# Patient Record
Sex: Female | Born: 1990 | Race: Black or African American | Hispanic: No | Marital: Single | Smoking: Former smoker
Health system: Southern US, Community
[De-identification: ages and names within clinical notes are randomized; demographics above are authoritative.]

## PROBLEM LIST (undated history)

## (undated) ENCOUNTER — Inpatient Hospital Stay (HOSPITAL_COMMUNITY): Payer: Self-pay

## (undated) DIAGNOSIS — J45909 Unspecified asthma, uncomplicated: Secondary | ICD-10-CM

## (undated) DIAGNOSIS — L309 Dermatitis, unspecified: Secondary | ICD-10-CM

## (undated) HISTORY — DX: Dermatitis, unspecified: L30.9

## (undated) HISTORY — PX: NASAL SINUS SURGERY: SHX719

---

## 2010-03-21 ENCOUNTER — Emergency Department (HOSPITAL_BASED_OUTPATIENT_CLINIC_OR_DEPARTMENT_OTHER): Admission: EM | Admit: 2010-03-21 | Discharge: 2010-03-21 | Payer: Self-pay | Admitting: Emergency Medicine

## 2010-07-27 ENCOUNTER — Emergency Department (HOSPITAL_BASED_OUTPATIENT_CLINIC_OR_DEPARTMENT_OTHER)
Admission: EM | Admit: 2010-07-27 | Discharge: 2010-07-27 | Payer: Self-pay | Source: Home / Self Care | Admitting: Emergency Medicine

## 2010-08-02 LAB — GC/CHLAMYDIA PROBE AMP, GENITAL
Chlamydia, DNA Probe: NEGATIVE
GC Probe Amp, Genital: NEGATIVE

## 2010-08-02 LAB — DIFFERENTIAL
Basophils Absolute: 0 10*3/uL (ref 0.0–0.1)
Basophils Relative: 0 % (ref 0–1)
Eosinophils Absolute: 0.9 10*3/uL — ABNORMAL HIGH (ref 0.0–0.7)
Eosinophils Relative: 9 % — ABNORMAL HIGH (ref 0–5)
Lymphocytes Relative: 21 % (ref 12–46)
Lymphs Abs: 2.1 10*3/uL (ref 0.7–4.0)
Monocytes Absolute: 0.8 10*3/uL (ref 0.1–1.0)
Monocytes Relative: 8 % (ref 3–12)
Neutro Abs: 6.2 10*3/uL (ref 1.7–7.7)
Neutrophils Relative %: 62 % (ref 43–77)

## 2010-08-02 LAB — COMPREHENSIVE METABOLIC PANEL
ALT: 22 U/L (ref 0–35)
AST: 46 U/L — ABNORMAL HIGH (ref 0–37)
Albumin: 3.8 g/dL (ref 3.5–5.2)
Alkaline Phosphatase: 41 U/L (ref 39–117)
BUN: 11 mg/dL (ref 6–23)
CO2: 21 mEq/L (ref 19–32)
Calcium: 9.2 mg/dL (ref 8.4–10.5)
Chloride: 107 mEq/L (ref 96–112)
Creatinine, Ser: 0.5 mg/dL (ref 0.4–1.2)
GFR calc Af Amer: >60 mL/min (ref >60)
GFR calc non Af Amer: >60 mL/min (ref >60)
Glucose, Bld: 78 mg/dL (ref 70–99)
Potassium: 4.8 mEq/L (ref 3.5–5.1)
Sodium: 138 mEq/L (ref 135–145)
Total Bilirubin: 0.6 mg/dL (ref 0.3–1.2)
Total Protein: 7.1 g/dL (ref 6.0–8.3)

## 2010-08-02 LAB — CBC
HCT: 30.7 % — ABNORMAL LOW (ref 36.0–46.0)
Hemoglobin: 10.3 g/dL — ABNORMAL LOW (ref 12.0–15.0)
MCH: 23.6 pg — ABNORMAL LOW (ref 26.0–34.0)
MCHC: 33.6 g/dL (ref 30.0–36.0)
MCV: 70.4 fL — ABNORMAL LOW (ref 78.0–100.0)
Platelets: 287 10*3/uL (ref 150–400)
RBC: 4.36 MIL/uL (ref 3.87–5.11)
RDW: 12.8 % (ref 11.5–15.5)
WBC: 10 10*3/uL (ref 4.0–10.5)

## 2010-08-02 LAB — URINALYSIS, ROUTINE W REFLEX MICROSCOPIC
Bilirubin Urine: NEGATIVE
Hgb urine dipstick: NEGATIVE
Ketones, ur: NEGATIVE mg/dL
Nitrite: NEGATIVE
Protein, ur: NEGATIVE mg/dL
Specific Gravity, Urine: 1.034 — ABNORMAL HIGH (ref 1.005–1.030)
Urine Glucose, Fasting: NEGATIVE mg/dL
Urobilinogen, UA: 1 mg/dL (ref 0.0–1.0)
pH: 6.5 (ref 5.0–8.0)

## 2010-08-02 LAB — WET PREP, GENITAL
Trich, Wet Prep: NONE SEEN
Yeast Wet Prep HPF POC: NONE SEEN

## 2010-08-02 LAB — PREGNANCY, URINE: Preg Test, Ur: POSITIVE

## 2010-10-26 ENCOUNTER — Emergency Department (HOSPITAL_BASED_OUTPATIENT_CLINIC_OR_DEPARTMENT_OTHER)
Admission: EM | Admit: 2010-10-26 | Discharge: 2010-10-26 | Disposition: A | Discharge status: Home / Self Care | Payer: 59 | Attending: Emergency Medicine | Admitting: Emergency Medicine

## 2010-10-26 DIAGNOSIS — J309 Allergic rhinitis, unspecified: Secondary | ICD-10-CM | POA: Insufficient documentation

## 2010-11-04 ENCOUNTER — Inpatient Hospital Stay (HOSPITAL_COMMUNITY)
Admission: AD | Admit: 2010-11-04 | Discharge: 2010-11-04 | Disposition: A | Discharge status: Home / Self Care | Payer: 59 | Source: Ambulatory Visit | Attending: Obstetrics & Gynecology | Admitting: Obstetrics & Gynecology

## 2010-11-04 DIAGNOSIS — O99891 Other specified diseases and conditions complicating pregnancy: Secondary | ICD-10-CM | POA: Insufficient documentation

## 2010-11-04 DIAGNOSIS — O9989 Other specified diseases and conditions complicating pregnancy, childbirth and the puerperium: Secondary | ICD-10-CM

## 2011-03-18 ENCOUNTER — Emergency Department (HOSPITAL_BASED_OUTPATIENT_CLINIC_OR_DEPARTMENT_OTHER)
Admission: EM | Admit: 2011-03-18 | Discharge: 2011-03-18 | Disposition: A | Discharge status: Home / Self Care | Payer: Self-pay | Attending: Emergency Medicine | Admitting: Emergency Medicine

## 2011-03-18 DIAGNOSIS — R51 Headache: Secondary | ICD-10-CM | POA: Insufficient documentation

## 2011-03-18 DIAGNOSIS — F17200 Nicotine dependence, unspecified, uncomplicated: Secondary | ICD-10-CM | POA: Insufficient documentation

## 2011-03-18 MED ORDER — SUMATRIPTAN SUCCINATE 100 MG PO TABS: 100.0000 mg | ORAL_TABLET | ORAL | Status: DC | PRN

## 2011-03-18 NOTE — ED Provider Notes (Signed)
History     CSN: [id number] Arrival date & time: 03/18/2011  4:36 PM  Chief Complaint  Patient presents with  . Headache   Patient is a 20 y.o. female presenting with headaches. The history is provided by the patient.  Headache  This is a new problem. The current episode started 6 to 12 hours ago. The problem has been resolved. The headache is associated with nothing. The pain is located in the occipital and temporal region. The quality of the pain is described as sharp. The pain is at a severity of 7/10. The pain is moderate. The pain does not radiate. She has tried nothing for the symptoms. The treatment provided no relief.    History reviewed. No pertinent past medical history.  History reviewed. No pertinent past surgical history.  No family history on file.  History  Substance Use Topics  . Smoking status: Current Some Day Smoker  . Smokeless tobacco: Not on file  . Alcohol Use: Yes     occasional    OB History    Grav Para Term Preterm Abortions TAB SAB Ect Mult Living                  Review of Systems  Neurological: Positive for headaches.  All other systems reviewed and are negative.    Physical Exam  BP 141/77  Pulse 101  Temp(Src) 98.3 F (36.8 C) (Oral)  Resp 16  Ht 5\' 7"  (1.702 m)  Wt 200 lb (90.719 kg)  BMI 31.32 kg/m2  SpO2 100%  LMP 02/15/2011  Physical Exam  Constitutional: She is oriented to person, place, and time. She appears well-developed and well-nourished.  HENT:  Head: Normocephalic and atraumatic.  Eyes: Pupils are equal, round, and reactive to light.  Neck: Normal range of motion. Neck supple.  Cardiovascular: Normal rate.   Pulmonary/Chest: Effort normal.  Abdominal: Soft. Bowel sounds are normal.  Musculoskeletal: Normal range of motion.  Neurological: She is alert and oriented to person, place, and time. She has normal reflexes. No cranial nerve deficit. Coordination normal.  Skin: Skin is warm and dry.  Psychiatric: She has  a normal mood and affect.    ED Course  Procedures  MDM  Pt counseled on headaches and followup.        Langston Masker, [place] 03/18/11 1724

## 2011-03-18 NOTE — ED Notes (Signed)
Headache x 1 week unrelieved after taking Ibuprofen and Advil.

## 2011-03-19 NOTE — ED Provider Notes (Signed)
Medical screening examination/treatment/procedure(s) were performed by non-physician practitioner and as supervising physician I was immediately available for consultation/collaboration.    Forbes Cellar, MD 03/19/11 0111

## 2011-06-24 ENCOUNTER — Other Ambulatory Visit (HOSPITAL_BASED_OUTPATIENT_CLINIC_OR_DEPARTMENT_OTHER): Payer: Self-pay | Admitting: *Deleted

## 2011-06-24 ENCOUNTER — Ambulatory Visit (HOSPITAL_BASED_OUTPATIENT_CLINIC_OR_DEPARTMENT_OTHER)
Admission: RE | Admit: 2011-06-24 | Discharge: 2011-06-24 | Disposition: A | Discharge status: Home / Self Care | Payer: Medicaid Other | Source: Ambulatory Visit | Attending: Family Medicine | Admitting: Family Medicine

## 2011-06-24 DIAGNOSIS — H059 Unspecified disorder of orbit: Secondary | ICD-10-CM

## 2011-06-24 DIAGNOSIS — H0589 Other disorders of orbit: Secondary | ICD-10-CM

## 2011-06-24 DIAGNOSIS — J3489 Other specified disorders of nose and nasal sinuses: Secondary | ICD-10-CM | POA: Insufficient documentation

## 2011-06-24 DIAGNOSIS — H5789 Other specified disorders of eye and adnexa: Secondary | ICD-10-CM | POA: Insufficient documentation

## 2011-06-24 MED ORDER — IOHEXOL 350 MG/ML SOLN
100.0000 mL | Freq: Once | INTRAVENOUS | Status: AC | PRN
  Administered 2011-06-24: 100 mL via INTRAVENOUS

## 2011-07-05 ENCOUNTER — Other Ambulatory Visit: Payer: Self-pay | Admitting: Obstetrics and Gynecology

## 2012-03-11 ENCOUNTER — Emergency Department (HOSPITAL_BASED_OUTPATIENT_CLINIC_OR_DEPARTMENT_OTHER)
Admission: EM | Admit: 2012-03-11 | Discharge: 2012-03-12 | Disposition: A | Discharge status: Home / Self Care | Payer: Medicaid Other | Attending: Emergency Medicine | Admitting: Emergency Medicine

## 2012-03-11 ENCOUNTER — Encounter (HOSPITAL_BASED_OUTPATIENT_CLINIC_OR_DEPARTMENT_OTHER): Payer: Self-pay | Admitting: *Deleted

## 2012-03-11 DIAGNOSIS — R55 Syncope and collapse: Secondary | ICD-10-CM | POA: Insufficient documentation

## 2012-03-11 DIAGNOSIS — B9689 Other specified bacterial agents as the cause of diseases classified elsewhere: Secondary | ICD-10-CM | POA: Insufficient documentation

## 2012-03-11 DIAGNOSIS — N760 Acute vaginitis: Secondary | ICD-10-CM | POA: Insufficient documentation

## 2012-03-11 DIAGNOSIS — A499 Bacterial infection, unspecified: Secondary | ICD-10-CM | POA: Insufficient documentation

## 2012-03-11 DIAGNOSIS — O2390 Unspecified genitourinary tract infection in pregnancy, unspecified trimester: Secondary | ICD-10-CM | POA: Insufficient documentation

## 2012-03-11 LAB — URINALYSIS, ROUTINE W REFLEX MICROSCOPIC
Bilirubin Urine: NEGATIVE
Glucose, UA: NEGATIVE mg/dL
Hgb urine dipstick: NEGATIVE
Ketones, ur: NEGATIVE mg/dL
Nitrite: NEGATIVE
Protein, ur: NEGATIVE mg/dL
Specific Gravity, Urine: 1.03 (ref 1.005–1.030)
Urobilinogen, UA: 1 mg/dL (ref 0.0–1.0)
pH: 6.5 (ref 5.0–8.0)

## 2012-03-11 LAB — URINE MICROSCOPIC-ADD ON

## 2012-03-11 NOTE — ED Notes (Signed)
Patient reports that while working today became weak and had syncopal event. Reports that she fell onto floor and denies any injury or discomfort. LMP reported as 10/12/11. No prenatal care up to this point, appointment scheduled in September. Third pregnancy and 2 living children, reports positive fetal movement. Denies discharge, denies bleeding

## 2012-03-11 NOTE — ED Provider Notes (Addendum)
History   This chart was scribed for Judaea Burgoon Smitty Cords, MD by Toya Smothers. The patient was seen in room MHT14/MHT14. Patient's care was started at 2213.   CSN: [id number]  Arrival date & time 03/11/12  2213   First MD Initiated Contact with Patient 03/11/12 2337      Chief Complaint  Patient presents with  . Loss of Consciousness   HPI Comments: Positive pregnant test. Scheduled to speak with OBGYN in Sept.  Patient is a 21 y.o. female presenting with syncope and fall. The history is provided by the patient. No language interpreter was used.  Loss of Consciousness This is a new problem. The current episode started 3 to 5 hours ago (After standing for an hour). The problem has been resolved. Pertinent negatives include no abdominal pain. Nothing aggravates the symptoms. The symptoms are relieved by relaxation. She has tried nothing for the symptoms. The treatment provided significant relief.  Fall The accident occurred 3 to 5 hours ago. The fall occurred while standing. She fell from a height of 1 to 2 ft. She landed on a hard floor. There was no blood loss. Point of impact: abdomen. The patient is experiencing no pain. Pertinent negatives include no abdominal pain and no hematuria. Exacerbated by: nothing. She has tried nothing for the symptoms. The treatment provided significant relief.  No CP, no SOB, no n/v/d.  No swelling in the legs no long car trips.  No vaginal bleeding or abdominal pain, No prenatal care.  LMP 3/27/ 13  History reviewed. No pertinent past medical history.  History reviewed. No pertinent past surgical history.  History reviewed. No pertinent family history.  History  Substance Use Topics  . Smoking status: Former Games developer  . Smokeless tobacco: Not on file  . Alcohol Use: No     occasional    OB History    Grav Para Term Preterm Abortions TAB SAB Ect Mult Living   3 2              Review of Systems  Respiratory: Negative for chest tightness.     Cardiovascular: Positive for syncope. Negative for palpitations and leg swelling.  Gastrointestinal: Negative for abdominal pain.  Genitourinary: Positive for frequency. Negative for dysuria, hematuria, decreased urine volume, vaginal bleeding, vaginal discharge, difficulty urinating, vaginal pain and menstrual problem.  Neurological: Positive for syncope.  All other systems reviewed and are negative.    Allergies  Review of patient's allergies indicates no known allergies.  Home Medications   Current Outpatient Rx  Name Route Sig Dispense Refill  . PRENATAL 27-0.8 MG PO TABS Oral Take 1 tablet by mouth daily.    . TRIAMCINOLONE ACETONIDE 0.1 % EX CREA Topical Apply 1 application topically 2 (two) times daily as needed. For eczema      BP 113/59  Pulse 78  Temp 98.1 F (36.7 C) (Oral)  Resp 20  Ht 5\' 7"  (1.702 m)  Wt 230 lb (104.327 kg)  BMI 36.02 kg/m2  SpO2 100%  LMP 10/12/2011  Physical Exam  Constitutional: She is oriented to person, place, and time. She appears well-developed and well-nourished. No distress.  HENT:  Head: Normocephalic and atraumatic.  Mouth/Throat: Oropharynx is clear and moist. No oropharyngeal exudate.  Eyes: EOM are normal. Pupils are equal, round, and reactive to light.  Neck: Normal range of motion. Neck supple. No tracheal deviation present.  Cardiovascular: Normal rate, regular rhythm, normal heart sounds and intact distal pulses.   Pulmonary/Chest: Effort normal and  breath sounds normal. No respiratory distress. She has no wheezes.  Abdominal: Bowel sounds are normal. There is no tenderness. There is no rebound and no guarding.       Gravid. Non-tender.  Genitourinary: Uterus normal. Uterus is not deviated. Vaginal discharge found.       Chaperone present no bleeding os is closed  Musculoskeletal: Normal range of motion. She exhibits no edema and no tenderness.  Neurological: She is alert and oriented to person, place, and time. She has  normal reflexes.  Skin: Skin is warm and dry.  Psychiatric: She has a normal mood and affect. Her behavior is normal.    ED Course  Procedures (including critical care time) DIAGNOSTIC STUDIES: Oxygen Saturation is 100% on room air, normal by my interpretation.    COORDINATION OF CARE: 2304- Ordered CBC with Differential STAT. 2304- Ordered Basic metabolic panel STAT. 2304- Ordered Urinalysis, Routine w reflex microscopic STAT. 2305- Ordered Urine culture Once. 2316- Ordered Urine microscopic-add on Once. 2324- Ordered hCG, quantitative, pregnancy STAT 2347- Evaluated Pt. Pt is awake, alert, and oriented. 0045- Ordered cefTRIAXone (ROCEPHIN) 1 g in dextrose 5 % 50 mL IVPB Once. 38- Chaperone present. Completed vaginal exam.   Labs Reviewed  CBC WITH DIFFERENTIAL - Abnormal; Notable for the following:    Hemoglobin 9.9 (*)     HCT 30.0 (*)     MCV 71.3 (*)     MCH 23.5 (*)     Eosinophils Relative 10 (*)     Eosinophils Absolute 0.9 (*)     All other components within normal limits  URINALYSIS, ROUTINE W REFLEX MICROSCOPIC - Abnormal; Notable for the following:    APPearance CLOUDY (*)     Leukocytes, UA MODERATE (*)     All other components within normal limits  HCG, QUANTITATIVE, PREGNANCY - Abnormal; Notable for the following:    hCG, Beta Chain, Quant, S 2691 (*)     All other components within normal limits  URINE MICROSCOPIC-ADD ON - Abnormal; Notable for the following:    Squamous Epithelial / LPF MANY (*)     Bacteria, UA MANY (*)     All other components within normal limits  BASIC METABOLIC PANEL  URINE CULTURE   No results found.   No diagnosis found.    MDM  Case d/w Dr. Vicente Serene, send to MAU Case d/w [facility] NP in [facility]U Will send to MAU for [place] and further monitoring given no prenatal care Patient given 1 L NSS and 1 g Rocephin rx for flagyl       I personally performed the services described in this documentation, which was scribed in my presence.  The recorded information has been reviewed and considered.     Jasmine Awe, MD 03/12/12 0155  Zaiden Ludlum K Tikesha Mort-Rasch, MD 03/12/12 [phone]

## 2012-03-11 NOTE — Progress Notes (Signed)
Requested by [facility] to centrally monitor pt. [facility] RN states pt is 21 wks, G3P2, w/no [facility]. FHT 150bpm, when central monitoring started.

## 2012-03-11 NOTE — ED Notes (Addendum)
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 Someone was helping her up. Pt is 5 months pregnant and has felt the baby move since the incident. Pt describes a "hard abd cramp" after passing out. No discharge or pain at present. No prenatal care.

## 2012-03-12 ENCOUNTER — Encounter (HOSPITAL_COMMUNITY): Payer: Self-pay | Admitting: *Deleted

## 2012-03-12 ENCOUNTER — Inpatient Hospital Stay (HOSPITAL_COMMUNITY)
Admission: AD | Admit: 2012-03-12 | Discharge: 2012-03-12 | Disposition: A | Discharge status: Home / Self Care | Payer: Medicaid Other | Source: Ambulatory Visit | Attending: Obstetrics & Gynecology | Admitting: Obstetrics & Gynecology

## 2012-03-12 ENCOUNTER — Inpatient Hospital Stay (HOSPITAL_COMMUNITY): Payer: Medicaid Other

## 2012-03-12 ENCOUNTER — Encounter: Payer: Self-pay | Admitting: *Deleted

## 2012-03-12 DIAGNOSIS — O2650 Maternal hypotension syndrome, unspecified trimester: Secondary | ICD-10-CM | POA: Insufficient documentation

## 2012-03-12 DIAGNOSIS — Z3480 Encounter for supervision of other normal pregnancy, unspecified trimester: Secondary | ICD-10-CM

## 2012-03-12 DIAGNOSIS — Z3490 Encounter for supervision of normal pregnancy, unspecified, unspecified trimester: Secondary | ICD-10-CM

## 2012-03-12 LAB — CBC WITH DIFFERENTIAL/PLATELET
Basophils Absolute: 0 10*3/uL (ref 0.0–0.1)
Basophils Relative: 0 % (ref 0–1)
Eosinophils Absolute: 0.9 10*3/uL — ABNORMAL HIGH (ref 0.0–0.7)
Eosinophils Relative: 10 % — ABNORMAL HIGH (ref 0–5)
HCT: 30 % — ABNORMAL LOW (ref 36.0–46.0)
Hemoglobin: 9.9 g/dL — ABNORMAL LOW (ref 12.0–15.0)
Lymphocytes Relative: 30 % (ref 12–46)
Lymphs Abs: 2.7 10*3/uL (ref 0.7–4.0)
MCH: 23.5 pg — ABNORMAL LOW (ref 26.0–34.0)
MCHC: 33 g/dL (ref 30.0–36.0)
MCV: 71.3 fL — ABNORMAL LOW (ref 78.0–100.0)
Monocytes Absolute: 0.8 10*3/uL (ref 0.1–1.0)
Monocytes Relative: 9 % (ref 3–12)
Neutro Abs: 4.7 10*3/uL (ref 1.7–7.7)
Neutrophils Relative %: 52 % (ref 43–77)
Platelets: 320 10*3/uL (ref 150–400)
RBC: 4.21 MIL/uL (ref 3.87–5.11)
RDW: 13.7 % (ref 11.5–15.5)
WBC: 9.2 10*3/uL (ref 4.0–10.5)

## 2012-03-12 LAB — BASIC METABOLIC PANEL
BUN: 11 mg/dL (ref 6–23)
CO2: 25 mEq/L (ref 19–32)
Calcium: 9.3 mg/dL (ref 8.4–10.5)
Chloride: 102 mEq/L (ref 96–112)
Creatinine, Ser: 0.6 mg/dL (ref 0.50–1.10)
GFR calc Af Amer: >90 mL/min (ref >90)
GFR calc non Af Amer: >90 mL/min (ref >90)
Glucose, Bld: 85 mg/dL (ref 70–99)
Potassium: 3.7 mEq/L (ref 3.5–5.1)
Sodium: 137 mEq/L (ref 135–145)

## 2012-03-12 LAB — HCG, QUANTITATIVE, PREGNANCY: hCG, Beta Chain, Quant, S: 2691 m[IU]/mL — ABNORMAL HIGH (ref <5)

## 2012-03-12 LAB — WET PREP, GENITAL
Trich, Wet Prep: NONE SEEN
Yeast Wet Prep HPF POC: NONE SEEN

## 2012-03-12 MED ORDER — METRONIDAZOLE 500 MG PO TABS: 500.0000 mg | ORAL_TABLET | Freq: Two times a day (BID) | ORAL | Status: AC

## 2012-03-12 MED ORDER — DEXTROSE 5 % IV SOLN
1.0000 g | Freq: Once | INTRAVENOUS | Status: AC
  Administered 2012-03-12: 1 g via INTRAVENOUS
  Filled 2012-03-12: qty 10

## 2012-03-12 NOTE — MAU Note (Signed)
To [place] from the triage room

## 2012-03-12 NOTE — MAU Note (Signed)
Pt states she fell to the floor at work on the way to the [place]-she was sent to [facility] to be checked out-and they sent her hre for an [place] to determine how far prgnant pt is

## 2012-03-12 NOTE — ED Notes (Signed)
Dr. Nicanor Alcon made aware of the recommendations from [facility] regarding the variables and suggestion for 1L NS. Bolus of NS up and patient continues to deny pain or any discharge

## 2012-03-12 NOTE — MAU Provider Note (Signed)
History     CSN: [id number]  Arrival date and time: 03/12/12 0335   None     Chief Complaint  Patient presents with  . Possible Pregnancy   HPI Pt is a 21 y.o. G3P2 [redacted]w[redacted]d sent from [facility] for Ob ultrasound for dating. Pt fainted and fell earlier this evening. She was seen at [facility] where she was found to be pregnant. She was treated there with IV fluids and medically cleared. Since she had not had any prenatal care yet, she was sent to [facility]U for ultrasound and further evaluation.  Pt denies abdominal pain/cramping or bleeding.  OB History    Grav Para Term Preterm Abortions TAB SAB Ect Mult Living   3 2        2       History reviewed. No pertinent past medical history.  Past Surgical History  Procedure Date  . Nasal sinus surgery   . No past surgeries     Family History  Problem Relation Age of Onset  . Hypertension Mother     History  Substance Use Topics  . Smoking status: Former Games developer  . Smokeless tobacco: Not on file  . Alcohol Use: No     occasional    Allergies: No Known Allergies  Prescriptions prior to admission  Medication Sig Dispense Refill  . metroNIDAZOLE (FLAGYL) 500 MG tablet Take 1 tablet (500 mg total) by mouth 2 (two) times daily. One po bid x 7 days  14 tablet  0  . Prenatal Vit-Fe Fumarate-FA (MULTIVITAMIN-PRENATAL) 27-0.8 MG TABS Take 1 tablet by mouth daily.      Marland Kitchen triamcinolone (KENALOG) 0.1 % cream Apply 1 application topically 2 (two) times daily as needed. For eczema        Review of Systems  Constitutional: Negative for fever and chills.  Eyes: Negative for blurred vision and double vision.  Respiratory: Negative for shortness of breath.   Cardiovascular: Negative for chest pain.  Gastrointestinal: Negative for nausea, vomiting and abdominal pain.  Musculoskeletal: Negative for back pain.  Neurological: Negative for dizziness, weakness and headaches.   Physical Exam   Blood pressure 140/65, pulse  85, temperature 98.2 F (36.8 C), temperature source Oral, resp. rate 20, height 5\' 7"  (1.702 m), weight 108.41 kg (239 lb), last menstrual period 10/12/2011, SpO2 100.00%.  Physical Exam  Constitutional: She is oriented to person, place, and time. She appears well-developed and well-nourished. No distress.  HENT:  Head: Normocephalic and atraumatic.  Eyes: Conjunctivae and EOM are normal.  Neck: Normal range of motion.  Cardiovascular: Normal rate, regular rhythm and normal heart sounds.   Respiratory: Effort normal and breath sounds normal.  GI: Soft. There is no tenderness. There is no rebound and no guarding.  Musculoskeletal: She exhibits no edema and no tenderness.  Neurological: She is alert and oriented to person, place, and time.  Skin: Skin is warm and dry.  Psychiatric: She has a normal mood and affect.    MAU Course  Procedures  Ultrasound done showing viable IUP dated at 21 wk 5 days with EDD 07/18/12, consistent with LMP. Normal placenta, normal fetal anatomy.  Assessment and Plan  21 y.o. G3P2 at [redacted]w[redacted]d with syncopal episode and incidental finding of pregnancy. 1.  OB sono normal, viable pregnancy 2.  Pt wishes to establish care here at [facility] - provided information to make appointment. 3.  Medically cleared at [facility].     Napoleon Form 03/12/2012, 4:55  AM  

## 2012-03-12 NOTE — MAU Note (Signed)
Pt denies dizziness or discomfort

## 2012-03-13 LAB — URINE CULTURE: Colony Count: 35000

## 2012-03-13 LAB — GC/CHLAMYDIA PROBE AMP, GENITAL
Chlamydia, DNA Probe: NEGATIVE
GC Probe Amp, Genital: NEGATIVE

## 2012-03-26 ENCOUNTER — Encounter: Payer: Medicaid Other | Admitting: Family Medicine

## 2012-04-04 ENCOUNTER — Encounter: Payer: Self-pay | Admitting: Family Medicine

## 2012-04-04 ENCOUNTER — Ambulatory Visit (INDEPENDENT_AMBULATORY_CARE_PROVIDER_SITE_OTHER): Payer: Self-pay | Admitting: Family Medicine

## 2012-04-04 VITALS — BP 128/79 | Temp 98.9°F | Wt 238.1 lb

## 2012-04-04 DIAGNOSIS — O0930 Supervision of pregnancy with insufficient antenatal care, unspecified trimester: Secondary | ICD-10-CM

## 2012-04-04 DIAGNOSIS — N898 Other specified noninflammatory disorders of vagina: Secondary | ICD-10-CM

## 2012-04-04 DIAGNOSIS — O26899 Other specified pregnancy related conditions, unspecified trimester: Secondary | ICD-10-CM

## 2012-04-04 DIAGNOSIS — O9989 Other specified diseases and conditions complicating pregnancy, childbirth and the puerperium: Secondary | ICD-10-CM

## 2012-04-04 LAB — POCT URINALYSIS DIP (DEVICE)
Bilirubin Urine: NEGATIVE
Hgb urine dipstick: NEGATIVE
Leukocytes, UA: NEGATIVE
Nitrite: NEGATIVE
Urobilinogen, UA: 0.2 mg/dL (ref 0.0–1.0)
pH: 6.5 (ref 5.0–8.0)

## 2012-04-04 LAB — HIV ANTIBODY (ROUTINE TESTING W REFLEX): HIV: NONREACTIVE

## 2012-04-04 NOTE — Progress Notes (Signed)
  Subjective:    Rachel Clay is being seen today for her first obstetrical visit.  This is not a planned pregnancy. She is at [redacted]w[redacted]d gestation. Her obstetrical history is significant for obesity.  Pregnancy history fully reviewed. Pt seen at [facility] medical center 8/26 for syncopal episode and found to be pregnant. Sent to [facility] for sono showing IUP 21 wk 5d. No complications with prior pregnancies. No complaints today except for thick white vaginal discharge, no itching or dysuria.  Review of Systems:   Review of Systems Negative except as stated in HPI.  Objective:    BP 128/79  Temp 98.9 F (37.2 C)  Wt 238 lb 1.6 oz (108.001 kg)  LMP 10/12/2011 Physical Exam  Exam GEN:  WNWD, no acute distress HEENT:  Healing abrasion on nasal bridge between eyes, normal conjunctiva, EOMI CV:  RRR, no murmur Resp:  CTAB Abdomen:  Gravid, size=dates, non-tender Extrem:  No edema, non-tender Neuro:  A&O, no focal deficits. GU:  Normal external genitalia, creamy white vaginal discharge.    Assessment:    Pregnancy: G3P2002 There is no problem list on file for this patient. Supervision of normal pregnancy.      Plan:     Initial labs drawn. GC negative in MAU 8/26. Pap done last year, normal per patient. Will not do at this time. Prenatal vitamins. Problem list reviewed and updated. AFP3 discussed: late prenatal care. Role of ultrasound in pregnancy discussed; fetal survey: results reviewed. Amniocentesis discussed: not indicated. Follow up in 3 weeks. 33% of 30 min visit spent on counseling and coordination of care.  Obesity - declines 1 hour GTT at this time. Will get next visit.  Napoleon Form 04/04/2012

## 2012-04-04 NOTE — Patient Instructions (Signed)
Pregnancy - Second Trimester The second trimester of pregnancy (3 to 6 months) is a period of rapid growth for you and your baby. At the end of the sixth month, your baby is about 9 inches long and weighs 1 1/2 pounds. You will begin to feel the baby move between 18 and 20 weeks of the pregnancy. This is called quickening. Weight gain is faster. A clear fluid (colostrum) may leak out of your breasts. You may feel small contractions of the womb (uterus). This is known as false labor or Braxton-Hicks contractions. This is like a practice for labor when the baby is ready to be born. Usually, the problems with morning sickness have usually passed by the end of your first trimester. Some women develop small dark blotches (called cholasma, mask of pregnancy) on their face that usually goes away after the baby is born. Exposure to the sun makes the blotches worse. Acne may also develop in some pregnant women and pregnant women who have acne, may find that it goes away. PRENATAL EXAMS  Blood work may continue to be done during prenatal exams. These tests are done to check on your health and the probable health of your baby. Blood work is used to follow your blood levels (hemoglobin). Anemia (low hemoglobin) is common during pregnancy. Iron and vitamins are given to help prevent this. You will also be checked for diabetes between 24 and 28 weeks of the pregnancy. Some of the previous blood tests may be repeated.   The size of the uterus is measured during each visit. This is to make sure that the baby is continuing to grow properly according to the dates of the pregnancy.   Your blood pressure is checked every prenatal visit. This is to make sure you are not getting toxemia.   Your urine is checked to make sure you do not have an infection, diabetes or protein in the urine.   Your weight is checked often to make sure gains are happening at the suggested rate. This is to ensure that both you and your baby are  growing normally.   Sometimes, an ultrasound is performed to confirm the proper growth and development of the baby. This is a test which bounces harmless sound waves off the baby so your caregiver can more accurately determine due dates.  Sometimes, a specialized test is done on the amniotic fluid surrounding the baby. This test is called an amniocentesis. The amniotic fluid is obtained by sticking a needle into the belly (abdomen). This is done to check the chromosomes in instances where there is a concern about possible genetic problems with the baby. It is also sometimes done near the end of pregnancy if an early delivery is required. In this case, it is done to help make sure the baby's lungs are mature enough for the baby to live outside of the womb. CHANGES OCCURING IN THE SECOND TRIMESTER OF PREGNANCY Your body goes through many changes during pregnancy. They vary from person to person. Talk to your caregiver about changes you notice that you are concerned about.  During the second trimester, you will likely have an increase in your appetite. It is normal to have cravings for certain foods. This varies from person to person and pregnancy to pregnancy.   Your lower abdomen will begin to bulge.   You may have to urinate more often because the uterus and baby are pressing on your bladder. It is also common to get more bladder infections during pregnancy (  pain with urination). You can help this by drinking lots of fluids and emptying your bladder before and after intercourse.   You may begin to get stretch marks on your hips, abdomen, and breasts. These are normal changes in the body during pregnancy. There are no exercises or medications to take that prevent this change.   You may begin to develop swollen and bulging veins (varicose veins) in your legs. Wearing support hose, elevating your feet for 15 minutes, 3 to 4 times a day and limiting salt in your diet helps lessen the problem.    Heartburn may develop as the uterus grows and pushes up against the stomach. Antacids recommended by your caregiver helps with this problem. Also, eating smaller meals 4 to 5 times a day helps.   Constipation can be treated with a stool softener or adding bulk to your diet. Drinking lots of fluids, vegetables, fruits, and whole grains are helpful.   Exercising is also helpful. If you have been very active up until your pregnancy, most of these activities can be continued during your pregnancy. If you have been less active, it is helpful to start an exercise program such as walking.   Hemorrhoids (varicose veins in the rectum) may develop at the end of the second trimester. Warm sitz baths and hemorrhoid cream recommended by your caregiver helps hemorrhoid problems.   Backaches may develop during this time of your pregnancy. Avoid heavy lifting, wear low heal shoes and practice good posture to help with backache problems.   Some pregnant women develop tingling and numbness of their hand and fingers because of swelling and tightening of ligaments in the wrist (carpel tunnel syndrome). This goes away after the baby is born.   As your breasts enlarge, you may have to get a bigger bra. Get a comfortable, cotton, support bra. Do not get a nursing bra until the last month of the pregnancy if you will be nursing the baby.   You may get a dark line from your belly button to the pubic area called the linea nigra.   You may develop rosy cheeks because of increase blood flow to the face.   You may develop spider looking lines of the face, neck, arms and chest. These go away after the baby is born.  HOME CARE INSTRUCTIONS   It is extremely important to avoid all smoking, herbs, alcohol, and unprescribed drugs during your pregnancy. These chemicals affect the formation and growth of the baby. Avoid these chemicals throughout the pregnancy to ensure the delivery of a healthy infant.   Most of your home  care instructions are the same as suggested for the first trimester of your pregnancy. Keep your caregiver's appointments. Follow your caregiver's instructions regarding medication use, exercise and diet.   During pregnancy, you are providing food for you and your baby. Continue to eat regular, well-balanced meals. Choose foods such as meat, fish, milk and other low fat dairy products, vegetables, fruits, and whole-grain breads and cereals. Your caregiver will tell you of the ideal weight gain.   A physical sexual relationship may be continued up until near the end of pregnancy if there are no other problems. Problems could include early (premature) leaking of amniotic fluid from the membranes, vaginal bleeding, abdominal pain, or other medical or pregnancy problems.   Exercise regularly if there are no restrictions. Check with your caregiver if you are unsure of the safety of some of your exercises. The greatest weight gain will occur in the   last 2 trimesters of pregnancy. Exercise will help you:   Control your weight.   Get you in shape for labor and delivery.   Lose weight after you have the baby.   Wear a good support or jogging bra for breast tenderness during pregnancy. This may help if worn during sleep. Pads or tissues may be used in the bra if you are leaking colostrum.   Do not use hot tubs, steam rooms or saunas throughout the pregnancy.   Wear your seat belt at all times when driving. This protects you and your baby if you are in an accident.   Avoid raw meat, uncooked cheese, cat litter boxes and soil used by cats. These carry germs that can cause birth defects in the baby.   The second trimester is also a good time to visit your dentist for your dental health if this has not been done yet. Getting your teeth cleaned is OK. Use a soft toothbrush. Brush gently during pregnancy.   It is easier to loose urine during pregnancy. Tightening up and strengthening the pelvic muscles will  help with this problem. Practice stopping your urination while you are going to the bathroom. These are the same muscles you need to strengthen. It is also the muscles you would use as if you were trying to stop from passing gas. You can practice tightening these muscles up 10 times a set and repeating this about 3 times per day. Once you know what muscles to tighten up, do not perform these exercises during urination. It is more likely to contribute to an infection by backing up the urine.   Ask for help if you have financial, counseling or nutritional needs during pregnancy. Your caregiver will be able to offer counseling for these needs as well as refer you for other special needs.   Your skin may become oily. If so, wash your face with mild soap, use non-greasy moisturizer and oil or cream based makeup.  MEDICATIONS AND DRUG USE IN PREGNANCY  Take prenatal vitamins as directed. The vitamin should contain 1 milligram of folic acid. Keep all vitamins out of reach of children. Only a couple vitamins or tablets containing iron may be fatal to a baby or young child when ingested.   Avoid use of all medications, including herbs, over-the-counter medications, not prescribed or suggested by your caregiver. Only take over-the-counter or prescription medicines for pain, discomfort, or fever as directed by your caregiver. Do not use aspirin.   Let your caregiver also know about herbs you may be using.   Alcohol is related to a number of birth defects. This includes fetal alcohol syndrome. All alcohol, in any form, should be avoided completely. Smoking will cause low birth rate and premature babies.   Street or illegal drugs are very harmful to the baby. They are absolutely forbidden. A baby born to an addicted mother will be addicted at birth. The baby will go through the same withdrawal an adult does.  SEEK MEDICAL CARE IF:  You have any concerns or worries during your pregnancy. It is better to call with  your questions if you feel they cannot wait, rather than worry about them. SEEK IMMEDIATE MEDICAL CARE IF:   An unexplained oral temperature above 102 F (38.9 C) develops, or as your caregiver suggests.   You have leaking of fluid from the vagina (birth canal). If leaking membranes are suspected, take your temperature and tell your caregiver of this when you call.   There   is vaginal spotting, bleeding, or passing clots. Tell your caregiver of the amount and how many pads are used. Light spotting in pregnancy is common, especially following intercourse.   You develop a bad smelling vaginal discharge with a change in the color from clear to white.   You continue to feel sick to your stomach (nauseated) and have no relief from remedies suggested. You vomit blood or coffee ground-like materials.   You lose more than 2 pounds of weight or gain more than 2 pounds of weight over 1 week, or as suggested by your caregiver.   You notice swelling of your face, hands, feet, or legs.   You get exposed to German measles and have never had them.   You are exposed to fifth disease or chickenpox.   You develop belly (abdominal) pain. Round ligament discomfort is a common non-cancerous (benign) cause of abdominal pain in pregnancy. Your caregiver still must evaluate you.   You develop a bad headache that does not go away.   You develop fever, diarrhea, pain with urination, or shortness of breath.   You develop visual problems, blurry, or double vision.   You fall or are in a car accident or any kind of trauma.   There is mental or physical violence at home.  Document Released: 06/28/2001 Document Revised: 06/23/2011 Document Reviewed: 12/31/2008 [employer] Patient Information 2012 [employer]. 

## 2012-04-04 NOTE — Progress Notes (Signed)
P:=101, states over the weekend had a lot of white, thick mucousy discharge. Given new pregnancy information. Discussed BMI and appropriate weight gain which patient is close to. Had a random glucose in ER . Discussed doing 1hrGtt at next visit.

## 2012-04-05 LAB — OBSTETRIC PANEL
Antibody Screen: POSITIVE — AB
Basophils Relative: 0 % (ref 0–1)
Eosinophils Absolute: 0.7 10*3/uL (ref 0.0–0.7)
Eosinophils Relative: 9 % — ABNORMAL HIGH (ref 0–5)
HCT: 33.2 % — ABNORMAL LOW (ref 36.0–46.0)
Hemoglobin: 10.9 g/dL — ABNORMAL LOW (ref 12.0–15.0)
Hepatitis B Surface Ag: NEGATIVE
Lymphs Abs: 1.6 10*3/uL (ref 0.7–4.0)
MCH: 23.3 pg — ABNORMAL LOW (ref 26.0–34.0)
MCHC: 32.8 g/dL (ref 30.0–36.0)
MCV: 71.1 fL — ABNORMAL LOW (ref 78.0–100.0)
Monocytes Absolute: 0.4 10*3/uL (ref 0.1–1.0)
Monocytes Relative: 5 % (ref 3–12)
Neutrophils Relative %: 64 % (ref 43–77)
RBC: 4.67 MIL/uL (ref 3.87–5.11)
Rh Type: POSITIVE

## 2012-04-05 LAB — WET PREP, GENITAL

## 2012-04-05 LAB — BLOOD TYPING, RBC ANTIGENS: Antigen Typing: NEGATIVE

## 2012-04-05 LAB — PRENATAL ANTIBODY IDENTIFICATION

## 2012-04-07 LAB — CULTURE, OB URINE: Colony Count: 75000

## 2012-04-25 ENCOUNTER — Encounter: Payer: Self-pay | Admitting: Obstetrics and Gynecology

## 2012-05-02 ENCOUNTER — Ambulatory Visit (INDEPENDENT_AMBULATORY_CARE_PROVIDER_SITE_OTHER): Payer: Self-pay | Admitting: Advanced Practice Midwife

## 2012-05-02 VITALS — BP 151/76 | Temp 97.0°F | Wt 244.2 lb

## 2012-05-02 DIAGNOSIS — O0930 Supervision of pregnancy with insufficient antenatal care, unspecified trimester: Secondary | ICD-10-CM | POA: Insufficient documentation

## 2012-05-02 DIAGNOSIS — Z3480 Encounter for supervision of other normal pregnancy, unspecified trimester: Secondary | ICD-10-CM

## 2012-05-02 LAB — POCT URINALYSIS DIP (DEVICE)
Bilirubin Urine: NEGATIVE
Glucose, UA: NEGATIVE mg/dL
Hgb urine dipstick: NEGATIVE
Nitrite: NEGATIVE
Specific Gravity, Urine: 1.025 (ref 1.005–1.030)
Urobilinogen, UA: 1 mg/dL (ref 0.0–1.0)

## 2012-05-02 NOTE — Progress Notes (Signed)
Doing well.  Good fetal movement, denies vaginal bleeding, LOF, regular contractions.  Some intermittent cramping.  Denies h/a, visual disturbances, or epigastric pain.  Unable to stay for glucose test today, will schedule for next week.

## 2012-05-02 NOTE — Progress Notes (Signed)
Pulse- 103 BP Recheck 133/77 Declines tdap and flu vaccine

## 2012-05-08 ENCOUNTER — Other Ambulatory Visit: Payer: Self-pay

## 2012-05-08 DIAGNOSIS — Z3480 Encounter for supervision of other normal pregnancy, unspecified trimester: Secondary | ICD-10-CM

## 2012-05-09 LAB — GLUCOSE TOLERANCE, 1 HOUR (50G) W/O FASTING: Glucose, 1 Hour GTT: 89 mg/dL (ref 70–140)

## 2012-05-10 ENCOUNTER — Encounter: Payer: Self-pay | Admitting: *Deleted

## 2012-05-16 ENCOUNTER — Ambulatory Visit (INDEPENDENT_AMBULATORY_CARE_PROVIDER_SITE_OTHER): Payer: Self-pay | Admitting: Obstetrics and Gynecology

## 2012-05-16 VITALS — BP 134/71 | Temp 98.0°F | Wt 248.8 lb

## 2012-05-16 DIAGNOSIS — O0930 Supervision of pregnancy with insufficient antenatal care, unspecified trimester: Secondary | ICD-10-CM

## 2012-05-16 LAB — POCT URINALYSIS DIP (DEVICE)
Glucose, UA: NEGATIVE mg/dL
Nitrite: NEGATIVE
Urobilinogen, UA: 1 mg/dL (ref 0.0–1.0)

## 2012-05-16 NOTE — Patient Instructions (Signed)
Pregnancy - Third Trimester  The third trimester of pregnancy (the last 3 months) is a period of the most rapid growth for you and your baby. The baby approaches a length of 20 inches and a weight of 6 to 10 pounds. The baby is adding on fat and getting ready for life outside your body. While inside, babies have periods of sleeping and waking, suck their thumbs, and hiccups. You can often feel small contractions of the uterus. This is false labor. It is also called Braxton-Hicks contractions. This is like a practice for labor. The usual problems in this stage of pregnancy include more difficulty breathing, swelling of the hands and feet from water retention, and having to urinate more often because of the uterus and baby pressing on your bladder.   PRENATAL EXAMS  · Blood work may continue to be done during prenatal exams. These tests are done to check on your health and the probable health of your baby. Blood work is used to follow your blood levels (hemoglobin). Anemia (low hemoglobin) is common during pregnancy. Iron and vitamins are given to help prevent this. You may also continue to be checked for diabetes. Some of the past blood tests may be done again.  · The size of the uterus is measured during each visit. This makes sure your baby is growing properly according to your pregnancy dates.  · Your blood pressure is checked every prenatal visit. This is to make sure you are not getting toxemia.  · Your urine is checked every prenatal visit for infection, diabetes and protein.  · Your weight is checked at each visit. This is done to make sure gains are happening at the suggested rate and that you and your baby are growing normally.  · Sometimes, an ultrasound is performed to confirm the position and the proper growth and development of the baby. This is a test done that bounces harmless sound waves off the baby so your caregiver can more accurately determine due dates.  · Discuss the type of pain medication and  anesthesia you will have during your labor and delivery.  · Discuss the possibility and anesthesia if a Cesarean Section might be necessary.  · Inform your caregiver if there is any mental or physical violence at home.  Sometimes, a specialized non-stress test, contraction stress test and biophysical profile are done to make sure the baby is not having a problem. Checking the amniotic fluid surrounding the baby is called an amniocentesis. The amniotic fluid is removed by sticking a needle into the belly (abdomen). This is sometimes done near the end of pregnancy if an early delivery is required. In this case, it is done to help make sure the baby's lungs are mature enough for the baby to live outside of the womb. If the lungs are not mature and it is unsafe to deliver the baby, an injection of cortisone medication is given to the mother 1 to 2 days before the delivery. This helps the baby's lungs mature and makes it safer to deliver the baby.  CHANGES OCCURING IN THE THIRD TRIMESTER OF PREGNANCY  Your body goes through many changes during pregnancy. They vary from person to person. Talk to your caregiver about changes you notice and are concerned about.  · During the last trimester, you have probably had an increase in your appetite. It is normal to have cravings for certain foods. This varies from person to person and pregnancy to pregnancy.  · You may begin to   get stretch marks on your hips, abdomen, and breasts. These are normal changes in the body during pregnancy. There are no exercises or medications to take which prevent this change.  · Constipation may be treated with a stool softener or adding bulk to your diet. Drinking lots of fluids, fiber in vegetables, fruits, and whole grains are helpful.  · Exercising is also helpful. If you have been very active up until your pregnancy, most of these activities can be continued during your pregnancy. If you have been less active, it is helpful to start an exercise  program such as walking. Consult your caregiver before starting exercise programs.  · Avoid all smoking, alcohol, un-prescribed drugs, herbs and "street drugs" during your pregnancy. These chemicals affect the formation and growth of the baby. Avoid chemicals throughout the pregnancy to ensure the delivery of a healthy infant.  · Backache, varicose veins and hemorrhoids may develop or get worse.  · You will tire more easily in the third trimester, which is normal.  · The baby's movements may be stronger and more often.  · You may become short of breath easily.  · Your belly button may stick out.  · A yellow discharge may leak from your breasts called colostrum.  · You may have a bloody mucus discharge. This usually occurs a few days to a week before labor begins.  HOME CARE INSTRUCTIONS   · Keep your caregiver's appointments. Follow your caregiver's instructions regarding medication use, exercise, and diet.  · During pregnancy, you are providing food for you and your baby. Continue to eat regular, well-balanced meals. Choose foods such as meat, fish, milk and other low fat dairy products, vegetables, fruits, and whole-grain breads and cereals. Your caregiver will tell you of the ideal weight gain.  · A physical sexual relationship may be continued throughout pregnancy if there are no other problems such as early (premature) leaking of amniotic fluid from the membranes, vaginal bleeding, or belly (abdominal) pain.  · Exercise regularly if there are no restrictions. Check with your caregiver if you are unsure of the safety of your exercises. Greater weight gain will occur in the last 2 trimesters of pregnancy. Exercising helps:  · Control your weight.  · Get you in shape for labor and delivery.  · You lose weight after you deliver.  · Rest a lot with legs elevated, or as needed for leg cramps or low back pain.  · Wear a good support or jogging bra for breast tenderness during pregnancy. This may help if worn during  sleep. Pads or tissues may be used in the bra if you are leaking colostrum.  · Do not use hot tubs, steam rooms, or saunas.  · Wear your seat belt when driving. This protects you and your baby if you are in an accident.  · Avoid raw meat, cat litter boxes and soil used by cats. These carry germs that can cause birth defects in the baby.  · It is easier to loose urine during pregnancy. Tightening up and strengthening the pelvic muscles will help with this problem. You can practice stopping your urination while you are going to the bathroom. These are the same muscles you need to strengthen. It is also the muscles you would use if you were trying to stop from passing gas. You can practice tightening these muscles up 10 times a set and repeating this about 3 times per day. Once you know what muscles to tighten up, do not perform these   exercises during urination. It is more likely to cause an infection by backing up the urine.  · Ask for help if you have financial, counseling or nutritional needs during pregnancy. Your caregiver will be able to offer counseling for these needs as well as refer you for other special needs.  · Make a list of emergency phone numbers and have them available.  · Plan on getting help from family or friends when you go home from the hospital.  · Make a trial run to the hospital.  · Take prenatal classes with the father to understand, practice and ask questions about the labor and delivery.  · Prepare the baby's room/nursery.  · Do not travel out of the city unless it is absolutely necessary and with the advice of your caregiver.  · Wear only low or no heal shoes to have better balance and prevent falling.  MEDICATIONS AND DRUG USE IN PREGNANCY  · Take prenatal vitamins as directed. The vitamin should contain 1 milligram of folic acid. Keep all vitamins out of reach of children. Only a couple vitamins or tablets containing iron may be fatal to a baby or young child when ingested.  · Avoid use  of all medications, including herbs, over-the-counter medications, not prescribed or suggested by your caregiver. Only take over-the-counter or prescription medicines for pain, discomfort, or fever as directed by your caregiver. Do not use aspirin, ibuprofen (Motrin®, Advil®, Nuprin®) or naproxen (Aleve®) unless OK'd by your caregiver.  · Let your caregiver also know about herbs you may be using.  · Alcohol is related to a number of birth defects. This includes fetal alcohol syndrome. All alcohol, in any form, should be avoided completely. Smoking will cause low birth rate and premature babies.  · Street/illegal drugs are very harmful to the baby. They are absolutely forbidden. A baby born to an addicted mother will be addicted at birth. The baby will go through the same withdrawal an adult does.  SEEK MEDICAL CARE IF:  You have any concerns or worries during your pregnancy. It is better to call with your questions if you feel they cannot wait, rather than worry about them.  DECISIONS ABOUT CIRCUMCISION  You may or may not know the sex of your baby. If you know your baby is a boy, it may be time to think about circumcision. Circumcision is the removal of the foreskin of the penis. This is the skin that covers the sensitive end of the penis. There is no proven medical need for this. Often this decision is made on what is popular at the time or based upon religious beliefs and social issues. You can discuss these issues with your caregiver or pediatrician.  SEEK IMMEDIATE MEDICAL CARE IF:   · An unexplained oral temperature above 102° F (38.9° C) develops, or as your caregiver suggests.  · You have leaking of fluid from the vagina (birth canal). If leaking membranes are suspected, take your temperature and tell your caregiver of this when you call.  · There is vaginal spotting, bleeding or passing clots. Tell your caregiver of the amount and how many pads are used.  · You develop a bad smelling vaginal discharge with  a change in the color from clear to white.  · You develop vomiting that lasts more than 24 hours.  · You develop chills or fever.  · You develop shortness of breath.  · You develop burning on urination.  · You loose more than 2 pounds of weight   or gain more than 2 pounds of weight or as suggested by your caregiver.  · You notice sudden swelling of your face, hands, and feet or legs.  · You develop belly (abdominal) pain. Round ligament discomfort is a common non-cancerous (benign) cause of abdominal pain in pregnancy. Your caregiver still must evaluate you.  · You develop a severe headache that does not go away.  · You develop visual problems, blurred or double vision.  · If you have not felt your baby move for more than 1 hour. If you think the baby is not moving as much as usual, eat something with sugar in it and lie down on your left side for an hour. The baby should move at least 4 to 5 times per hour. Call right away if your baby moves less than that.  · You fall, are in a car accident or any kind of trauma.  · There is mental or physical violence at home.  Document Released: 06/28/2001 Document Revised: 09/26/2011 Document Reviewed: 12/31/2008  [employer]® Patient Information ©2013 [employer].

## 2012-05-16 NOTE — Progress Notes (Signed)
Anti C antibody titer 2 a month ago. Per Dr. Macon Large recheck titer today. Doing well. Female fetus (has 2 girls) reviewed glucola 89.

## 2012-05-16 NOTE — Progress Notes (Signed)
Pulse- 95  Pressure/pain-"stomach"

## 2012-05-16 NOTE — Addendum Note (Signed)
Addended by: Franchot Mimes on: 05/16/2012 01:42 PM   Modules accepted: Orders

## 2012-05-17 LAB — ANTIBODY SCREEN: Antibody Screen: POSITIVE — AB

## 2012-05-17 LAB — PRENATAL ANTIBODY IDENTIFICATION

## 2012-05-30 ENCOUNTER — Ambulatory Visit (INDEPENDENT_AMBULATORY_CARE_PROVIDER_SITE_OTHER): Payer: Medicaid Other | Admitting: Advanced Practice Midwife

## 2012-05-30 ENCOUNTER — Encounter: Payer: Self-pay | Admitting: Advanced Practice Midwife

## 2012-05-30 VITALS — BP 123/69 | Temp 97.0°F | Wt 247.0 lb

## 2012-05-30 DIAGNOSIS — O0930 Supervision of pregnancy with insufficient antenatal care, unspecified trimester: Secondary | ICD-10-CM

## 2012-05-30 LAB — POCT URINALYSIS DIP (DEVICE)
Hgb urine dipstick: NEGATIVE
Nitrite: NEGATIVE
Urobilinogen, UA: 1 mg/dL (ref 0.0–1.0)
pH: 7 (ref 5.0–8.0)

## 2012-05-30 NOTE — Patient Instructions (Addendum)
Pregnancy - Third Trimester  The third trimester of pregnancy (the last 3 months) is a period of the most rapid growth for you and your baby. The baby approaches a length of 20 inches and a weight of 6 to 10 pounds. The baby is adding on fat and getting ready for life outside your body. While inside, babies have periods of sleeping and waking, suck their thumbs, and hiccups. You can often feel small contractions of the uterus. This is false labor. It is also called Braxton-Hicks contractions. This is like a practice for labor. The usual problems in this stage of pregnancy include more difficulty breathing, swelling of the hands and feet from water retention, and having to urinate more often because of the uterus and baby pressing on your bladder.   PRENATAL EXAMS  · Blood work may continue to be done during prenatal exams. These tests are done to check on your health and the probable health of your baby. Blood work is used to follow your blood levels (hemoglobin). Anemia (low hemoglobin) is common during pregnancy. Iron and vitamins are given to help prevent this. You may also continue to be checked for diabetes. Some of the past blood tests may be done again.  · The size of the uterus is measured during each visit. This makes sure your baby is growing properly according to your pregnancy dates.  · Your blood pressure is checked every prenatal visit. This is to make sure you are not getting toxemia.  · Your urine is checked every prenatal visit for infection, diabetes and protein.  · Your weight is checked at each visit. This is done to make sure gains are happening at the suggested rate and that you and your baby are growing normally.  · Sometimes, an ultrasound is performed to confirm the position and the proper growth and development of the baby. This is a test done that bounces harmless sound waves off the baby so your caregiver can more accurately determine due dates.  · Discuss the type of pain medication and  anesthesia you will have during your labor and delivery.  · Discuss the possibility and anesthesia if a Cesarean Section might be necessary.  · Inform your caregiver if there is any mental or physical violence at home.  Sometimes, a specialized non-stress test, contraction stress test and biophysical profile are done to make sure the baby is not having a problem. Checking the amniotic fluid surrounding the baby is called an amniocentesis. The amniotic fluid is removed by sticking a needle into the belly (abdomen). This is sometimes done near the end of pregnancy if an early delivery is required. In this case, it is done to help make sure the baby's lungs are mature enough for the baby to live outside of the womb. If the lungs are not mature and it is unsafe to deliver the baby, an injection of cortisone medication is given to the mother 1 to 2 days before the delivery. This helps the baby's lungs mature and makes it safer to deliver the baby.  CHANGES OCCURING IN THE THIRD TRIMESTER OF PREGNANCY  Your body goes through many changes during pregnancy. They vary from person to person. Talk to your caregiver about changes you notice and are concerned about.  · During the last trimester, you have probably had an increase in your appetite. It is normal to have cravings for certain foods. This varies from person to person and pregnancy to pregnancy.  · You may begin to   get stretch marks on your hips, abdomen, and breasts. These are normal changes in the body during pregnancy. There are no exercises or medications to take which prevent this change.  · Constipation may be treated with a stool softener or adding bulk to your diet. Drinking lots of fluids, fiber in vegetables, fruits, and whole grains are helpful.  · Exercising is also helpful. If you have been very active up until your pregnancy, most of these activities can be continued during your pregnancy. If you have been less active, it is helpful to start an exercise  program such as walking. Consult your caregiver before starting exercise programs.  · Avoid all smoking, alcohol, un-prescribed drugs, herbs and "street drugs" during your pregnancy. These chemicals affect the formation and growth of the baby. Avoid chemicals throughout the pregnancy to ensure the delivery of a healthy infant.  · Backache, varicose veins and hemorrhoids may develop or get worse.  · You will tire more easily in the third trimester, which is normal.  · The baby's movements may be stronger and more often.  · You may become short of breath easily.  · Your belly button may stick out.  · A yellow discharge may leak from your breasts called colostrum.  · You may have a bloody mucus discharge. This usually occurs a few days to a week before labor begins.  HOME CARE INSTRUCTIONS   · Keep your caregiver's appointments. Follow your caregiver's instructions regarding medication use, exercise, and diet.  · During pregnancy, you are providing food for you and your baby. Continue to eat regular, well-balanced meals. Choose foods such as meat, fish, milk and other low fat dairy products, vegetables, fruits, and whole-grain breads and cereals. Your caregiver will tell you of the ideal weight gain.  · A physical sexual relationship may be continued throughout pregnancy if there are no other problems such as early (premature) leaking of amniotic fluid from the membranes, vaginal bleeding, or belly (abdominal) pain.  · Exercise regularly if there are no restrictions. Check with your caregiver if you are unsure of the safety of your exercises. Greater weight gain will occur in the last 2 trimesters of pregnancy. Exercising helps:  · Control your weight.  · Get you in shape for labor and delivery.  · You lose weight after you deliver.  · Rest a lot with legs elevated, or as needed for leg cramps or low back pain.  · Wear a good support or jogging bra for breast tenderness during pregnancy. This may help if worn during  sleep. Pads or tissues may be used in the bra if you are leaking colostrum.  · Do not use hot tubs, steam rooms, or saunas.  · Wear your seat belt when driving. This protects you and your baby if you are in an accident.  · Avoid raw meat, cat litter boxes and soil used by cats. These carry germs that can cause birth defects in the baby.  · It is easier to loose urine during pregnancy. Tightening up and strengthening the pelvic muscles will help with this problem. You can practice stopping your urination while you are going to the bathroom. These are the same muscles you need to strengthen. It is also the muscles you would use if you were trying to stop from passing gas. You can practice tightening these muscles up 10 times a set and repeating this about 3 times per day. Once you know what muscles to tighten up, do not perform these   exercises during urination. It is more likely to cause an infection by backing up the urine.  · Ask for help if you have financial, counseling or nutritional needs during pregnancy. Your caregiver will be able to offer counseling for these needs as well as refer you for other special needs.  · Make a list of emergency phone numbers and have them available.  · Plan on getting help from family or friends when you go home from the hospital.  · Make a trial run to the hospital.  · Take prenatal classes with the father to understand, practice and ask questions about the labor and delivery.  · Prepare the baby's room/nursery.  · Do not travel out of the city unless it is absolutely necessary and with the advice of your caregiver.  · Wear only low or no heal shoes to have better balance and prevent falling.  MEDICATIONS AND DRUG USE IN PREGNANCY  · Take prenatal vitamins as directed. The vitamin should contain 1 milligram of folic acid. Keep all vitamins out of reach of children. Only a couple vitamins or tablets containing iron may be fatal to a baby or young child when ingested.  · Avoid use  of all medications, including herbs, over-the-counter medications, not prescribed or suggested by your caregiver. Only take over-the-counter or prescription medicines for pain, discomfort, or fever as directed by your caregiver. Do not use aspirin, ibuprofen (Motrin®, Advil®, Nuprin®) or naproxen (Aleve®) unless OK'd by your caregiver.  · Let your caregiver also know about herbs you may be using.  · Alcohol is related to a number of birth defects. This includes fetal alcohol syndrome. All alcohol, in any form, should be avoided completely. Smoking will cause low birth rate and premature babies.  · Street/illegal drugs are very harmful to the baby. They are absolutely forbidden. A baby born to an addicted mother will be addicted at birth. The baby will go through the same withdrawal an adult does.  SEEK MEDICAL CARE IF:  You have any concerns or worries during your pregnancy. It is better to call with your questions if you feel they cannot wait, rather than worry about them.  DECISIONS ABOUT CIRCUMCISION  You may or may not know the sex of your baby. If you know your baby is a boy, it may be time to think about circumcision. Circumcision is the removal of the foreskin of the penis. This is the skin that covers the sensitive end of the penis. There is no proven medical need for this. Often this decision is made on what is popular at the time or based upon religious beliefs and social issues. You can discuss these issues with your caregiver or pediatrician.  SEEK IMMEDIATE MEDICAL CARE IF:   · An unexplained oral temperature above 102° F (38.9° C) develops, or as your caregiver suggests.  · You have leaking of fluid from the vagina (birth canal). If leaking membranes are suspected, take your temperature and tell your caregiver of this when you call.  · There is vaginal spotting, bleeding or passing clots. Tell your caregiver of the amount and how many pads are used.  · You develop a bad smelling vaginal discharge with  a change in the color from clear to white.  · You develop vomiting that lasts more than 24 hours.  · You develop chills or fever.  · You develop shortness of breath.  · You develop burning on urination.  · You loose more than 2 pounds of weight   or gain more than 2 pounds of weight or as suggested by your caregiver.  · You notice sudden swelling of your face, hands, and feet or legs.  · You develop belly (abdominal) pain. Round ligament discomfort is a common non-cancerous (benign) cause of abdominal pain in pregnancy. Your caregiver still must evaluate you.  · You develop a severe headache that does not go away.  · You develop visual problems, blurred or double vision.  · If you have not felt your baby move for more than 1 hour. If you think the baby is not moving as much as usual, eat something with sugar in it and lie down on your left side for an hour. The baby should move at least 4 to 5 times per hour. Call right away if your baby moves less than that.  · You fall, are in a car accident or any kind of trauma.  · There is mental or physical violence at home.  Document Released: 06/28/2001 Document Revised: 09/26/2011 Document Reviewed: 12/31/2008  ExitCare® Patient Information ©2013 [employer].

## 2012-05-30 NOTE — Progress Notes (Signed)
Feels well. No contractions, leaking, or bleeding.

## 2012-05-30 NOTE — Progress Notes (Signed)
P = 96 Pain/pressure in lower abdomen

## 2012-06-13 ENCOUNTER — Ambulatory Visit (INDEPENDENT_AMBULATORY_CARE_PROVIDER_SITE_OTHER): Payer: Medicaid Other | Admitting: Obstetrics & Gynecology

## 2012-06-13 VITALS — BP 131/72 | Temp 97.5°F | Wt 247.9 lb

## 2012-06-13 DIAGNOSIS — O0930 Supervision of pregnancy with insufficient antenatal care, unspecified trimester: Secondary | ICD-10-CM

## 2012-06-13 DIAGNOSIS — O361190 Maternal care for Anti-A sensitization, unspecified trimester, not applicable or unspecified: Secondary | ICD-10-CM | POA: Insufficient documentation

## 2012-06-13 LAB — POCT URINALYSIS DIP (DEVICE)
Nitrite: NEGATIVE
Protein, ur: 30 mg/dL — AB
pH: 7 (ref 5.0–8.0)

## 2012-06-13 NOTE — Progress Notes (Signed)
P-103 

## 2012-06-13 NOTE — Patient Instructions (Signed)
Return to clinic for any obstetric concerns or go to [facility]U for evaluation  

## 2012-06-13 NOTE — Progress Notes (Signed)
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 Claudean Severance (MFM), will draw today.  S>D, will follow. If still present next week, consider ultrasound. No other complaints or concerns.  Fetal movement and labor precautions reviewed.

## 2012-06-15 ENCOUNTER — Encounter: Payer: Self-pay | Admitting: Obstetrics & Gynecology

## 2012-06-15 LAB — ANTIBODY TITER (PRENATAL TITER): Ab Titer: 4

## 2012-06-19 ENCOUNTER — Inpatient Hospital Stay (HOSPITAL_COMMUNITY)
Admission: AD | Admit: 2012-06-19 | Discharge: 2012-06-19 | Disposition: A | Discharge status: Home / Self Care | Payer: Medicaid Other | Source: Ambulatory Visit | Attending: Obstetrics & Gynecology | Admitting: Obstetrics & Gynecology

## 2012-06-19 ENCOUNTER — Encounter (HOSPITAL_COMMUNITY): Payer: Self-pay | Admitting: *Deleted

## 2012-06-19 DIAGNOSIS — N949 Unspecified condition associated with female genital organs and menstrual cycle: Secondary | ICD-10-CM | POA: Insufficient documentation

## 2012-06-19 DIAGNOSIS — N859 Noninflammatory disorder of uterus, unspecified: Secondary | ICD-10-CM

## 2012-06-19 DIAGNOSIS — R109 Unspecified abdominal pain: Secondary | ICD-10-CM | POA: Insufficient documentation

## 2012-06-19 DIAGNOSIS — O99891 Other specified diseases and conditions complicating pregnancy: Secondary | ICD-10-CM | POA: Insufficient documentation

## 2012-06-19 DIAGNOSIS — O26899 Other specified pregnancy related conditions, unspecified trimester: Secondary | ICD-10-CM

## 2012-06-19 HISTORY — DX: Unspecified asthma, uncomplicated: J45.909

## 2012-06-19 LAB — URINE MICROSCOPIC-ADD ON

## 2012-06-19 LAB — URINALYSIS, ROUTINE W REFLEX MICROSCOPIC
Bilirubin Urine: NEGATIVE
Nitrite: NEGATIVE
Specific Gravity, Urine: 1.02 (ref 1.005–1.030)
pH: 7 (ref 5.0–8.0)

## 2012-06-19 NOTE — [facility] Note (Signed)
abd pain and pressure, "all last night.". No bleeding or leaking. Has not been checked.

## 2012-06-19 NOTE — [facility]U Provider Note (Signed)
History     CSN: [id number]  Arrival date and time: 06/19/12 0865   First Provider Initiated Contact with Patient 06/19/12 1116      Chief Complaint  Patient presents with  . Labor Eval   HPI 21 y.o. [id number] at [redacted]w[redacted]d with pelvic pressure and abd tightening overnight. Denies bleeding or LOF. + fetal movement. Uncomplicated prenatal course.    Past Medical History  Diagnosis Date  . Asthma     childhood    Past Surgical History  Procedure Date  . Nasal sinus surgery     Family History  Problem Relation Age of Onset  . Hypertension Mother   . Sleep apnea Daughter     History  Substance Use Topics  . Smoking status: Former Games developer  . Smokeless tobacco: Never Used  . Alcohol Use: No     Comment: occasional    Allergies: No Known Allergies  Prescriptions prior to admission  Medication Sig Dispense Refill  . Pediatric Multivit-Minerals-C (FLINTSTONES GUMMIES PO) Take 2 each by mouth daily.      Marland Kitchen triamcinolone (KENALOG) 0.1 % cream Apply 1 application topically 2 (two) times daily as needed. For eczema        Review of Systems  Constitutional: Negative.   Respiratory: Negative.   Cardiovascular: Negative.   Gastrointestinal: Positive for abdominal pain. Negative for nausea, vomiting, diarrhea and constipation.  Genitourinary: Negative for dysuria, urgency, frequency, hematuria and flank pain.       Negative for vaginal bleeding, + cramping/contractions  Musculoskeletal: Negative.   Neurological: Negative.   Psychiatric/Behavioral: Negative.    Physical Exam   Blood pressure 121/69, pulse 85, temperature 98 F (36.7 C), temperature source Oral, resp. rate 18, height 5\' 7"  (1.702 m), weight 250 lb (113.399 kg), last menstrual period 10/12/2011, SpO2 100.00%.  Physical Exam  Nursing note and vitals reviewed. Constitutional: She is oriented to person, place, and time. She appears well-developed and well-nourished. No distress.  Cardiovascular: Normal rate.    Respiratory: Effort normal.  GI: Soft. There is no tenderness.  Genitourinary:       Dilation: 1.5 Effacement (%): Thick Cervical Position: Posterior Station: -3 Exam by:: Telford Nab, CNM   Musculoskeletal: Normal range of motion.  Neurological: She is alert and oriented to person, place, and time.  Skin: Skin is warm and dry.  Psychiatric: She has a normal mood and affect.   EFM: 140, mod variability, + accel, no decel, reactive TOCO: rare UC, irritability  MAU Course  Procedures Results for orders placed during the hospital encounter of 06/19/12 (from the past 24 hour(s))  URINALYSIS, ROUTINE W REFLEX MICROSCOPIC     Status: Abnormal   Collection Time   06/19/12  9:50 AM      Component Value Range   Color, Urine YELLOW  YELLOW   APPearance CLEAR  CLEAR   Specific Gravity, Urine 1.020  1.005 - 1.030   pH 7.0  5.0 - 8.0   Glucose, UA NEGATIVE  NEGATIVE mg/dL   Hgb urine dipstick TRACE (*) NEGATIVE   Bilirubin Urine NEGATIVE  NEGATIVE   Ketones, ur NEGATIVE  NEGATIVE mg/dL   Protein, ur NEGATIVE  NEGATIVE mg/dL   Urobilinogen, UA 0.2  0.0 - 1.0 mg/dL   Nitrite NEGATIVE  NEGATIVE   Leukocytes, UA MODERATE (*) NEGATIVE  URINE MICROSCOPIC-ADD ON     Status: Abnormal   Collection Time   06/19/12  9:50 AM      Component Value Range   Squamous Epithelial /  LPF MANY (*) RARE   WBC, UA 3-6  <3 WBC/hpf   RBC / HPF 0-2  <3 RBC/hpf   Bacteria, UA RARE  RARE   Urine-Other AMORPHOUS URATES/PHOSPHATES       Assessment and Plan   1. Pelvic pain in pregnancy   2. Uterine irritability       Medication List     As of 06/19/2012  3:20 PM    CONTINUE taking these medications         FLINTSTONES GUMMIES PO      triamcinolone cream 0.1 %   Commonly known as: KENALOG            Follow-up Information    Follow up with [facility] In 1 day. (as scheduled)    Contact information:   [address] [place] [address] [phone]            Bennie Chirico 06/19/2012, 11:22 AM

## 2012-06-19 NOTE — MAU Provider Note (Signed)
Attestation of Attending Supervision of Advanced Practitioner (CNM/NP): Evaluation and management procedures were performed by the Advanced Practitioner under my supervision and collaboration.  I have reviewed the Advanced Practitioner's note and chart, and I agree with the management and plan.  HARRAWAY-SMITH, Ziyan Schoon 8:47 PM     

## 2012-06-20 ENCOUNTER — Ambulatory Visit (INDEPENDENT_AMBULATORY_CARE_PROVIDER_SITE_OTHER): Payer: Medicaid Other | Admitting: Obstetrics & Gynecology

## 2012-06-20 ENCOUNTER — Other Ambulatory Visit (HOSPITAL_COMMUNITY)
Admission: RE | Admit: 2012-06-20 | Discharge: 2012-06-20 | Disposition: A | Discharge status: Home / Self Care | Payer: Medicaid Other | Source: Ambulatory Visit | Attending: Obstetrics & Gynecology | Admitting: Obstetrics & Gynecology

## 2012-06-20 VITALS — BP 129/79 | Temp 97.3°F | Wt 251.2 lb

## 2012-06-20 DIAGNOSIS — Z113 Encounter for screening for infections with a predominantly sexual mode of transmission: Secondary | ICD-10-CM | POA: Insufficient documentation

## 2012-06-20 DIAGNOSIS — O0930 Supervision of pregnancy with insufficient antenatal care, unspecified trimester: Secondary | ICD-10-CM

## 2012-06-20 LAB — POCT URINALYSIS DIP (DEVICE)
Bilirubin Urine: NEGATIVE
Glucose, UA: NEGATIVE mg/dL
Leukocytes, UA: NEGATIVE
Nitrite: NEGATIVE

## 2012-06-20 LAB — OB RESULTS CONSOLE GBS: GBS: NEGATIVE

## 2012-06-20 LAB — URINE CULTURE

## 2012-06-20 LAB — OB RESULTS CONSOLE GC/CHLAMYDIA: Chlamydia: NEGATIVE

## 2012-06-20 NOTE — Patient Instructions (Signed)
Normal Labor and Delivery  Your caregiver must first be sure you are in labor. Signs of labor include:  · You may pass what is called "the mucus plug" before labor begins. This is a small amount of blood stained mucus.  · Regular uterine contractions.  · The time between contractions get closer together.  · The discomfort and pain gradually gets more intense.  · Pains are mostly located in the back.  · Pains get worse when walking.  · The cervix (the opening of the uterus becomes thinner (begins to efface) and opens up (dilates).  Once you are in labor and admitted into the hospital or care center, your caregiver will do the following:  · A complete physical examination.  · Check your vital signs (blood pressure, pulse, temperature and the fetal heart rate).  · Do a vaginal examination (using a sterile glove and lubricant) to determine:  · The position (presentation) of the baby (head [vertex] or buttock first).  · The level (station) of the baby's head in the birth canal.  · The effacement and dilatation of the cervix.  · You may have your pubic hair shaved and be given an enema depending on your caregiver and the circumstance.  · An electronic monitor is usually placed on your abdomen. The monitor follows the length and intensity of the contractions, as well as the baby's heart rate.  · Usually, your caregiver will insert an IV in your arm with a bottle of sugar water. This is done as a precaution so that medications can be given to you quickly during labor or delivery.  NORMAL LABOR AND DELIVERY IS DIVIDED UP INTO 3 STAGES:  First Stage  This is when regular contractions begin and the cervix begins to efface and dilate. This stage can last from 3 to 15 hours. The end of the first stage is when the cervix is 100% effaced and 10 centimeters dilated. Pain medications may be given by   · Injection (morphine, demerol, etc.)  · Regional anesthesia (spinal, caudal or epidural, anesthetics given in different locations of  the spine). Paracervical pain medication may be given, which is an injection of and anesthetic on each side of the cervix.  A pregnant woman may request to have "Natural Childbirth" which is not to have any medications or anesthesia during her labor and delivery.  Second Stage  This is when the baby comes down through the birth canal (vagina) and is born. This can take 1 to 4 hours. As the baby's head comes down through the birth canal, you may feel like you are going to have a bowel movement. You will get the urge to bear down and push until the baby is delivered. As the baby's head is being delivered, the caregiver will decide if an episiotomy (a cut in the perineum and vagina area) is needed to prevent tearing of the tissue in this area. The episiotomy is sewn up after the delivery of the baby and placenta. Sometimes a mask with nitrous oxide is given for the mother to breath during the delivery of the baby to help if there is too much pain. The end of Stage 2 is when the baby is fully delivered. Then when the umbilical cord stops pulsating it is clamped and cut.  Third Stage  The third stage begins after the baby is completely delivered and ends after the placenta (afterbirth) is delivered. This usually takes 5 to 30 minutes. After the placenta is delivered, a medication   is given either by intravenous or injection to help contract the uterus and prevent bleeding. The third stage is not painful and pain medication is usually not necessary. If an episiotomy was done, it is repaired at this time.  After the delivery, the mother is watched and monitored closely for 1 to 2 hours to make sure there is no postpartum bleeding (hemorrhage). If there is a lot of bleeding, medication is given to contract the uterus and stop the bleeding.  Document Released: 04/12/2008 Document Revised: 09/26/2011 Document Reviewed: 04/12/2008  Exit[employer]® Patient Information ©2013 [employer].

## 2012-06-20 NOTE — Progress Notes (Signed)
P=89, C/o stronger pelvic pressure, declines flu shot at this time., information given. Also discussed tdap.

## 2012-06-20 NOTE — Addendum Note (Signed)
Addended by: Toula Moos on: 06/20/2012 03:22 PM   Modules accepted: Orders

## 2012-06-20 NOTE — Progress Notes (Signed)
21 y/o G2P1 with only complaint of pelvic pressure. Some irregular contractions. Confirmed vertex presentation with bedside US. RTC weekly. NO complications during last pregnancy, induced for post dates and delivered in 6 hours.

## 2012-06-27 ENCOUNTER — Encounter: Payer: Self-pay | Admitting: Obstetrics and Gynecology

## 2012-06-27 ENCOUNTER — Ambulatory Visit (INDEPENDENT_AMBULATORY_CARE_PROVIDER_SITE_OTHER): Payer: Medicaid Other | Admitting: Obstetrics and Gynecology

## 2012-06-27 VITALS — BP 135/80 | Temp 98.9°F | Wt 251.6 lb

## 2012-06-27 DIAGNOSIS — O0930 Supervision of pregnancy with insufficient antenatal care, unspecified trimester: Secondary | ICD-10-CM

## 2012-06-27 DIAGNOSIS — Z3480 Encounter for supervision of other normal pregnancy, unspecified trimester: Secondary | ICD-10-CM

## 2012-06-27 LAB — POCT URINALYSIS DIP (DEVICE)
Bilirubin Urine: NEGATIVE
Ketones, ur: NEGATIVE mg/dL
Protein, ur: NEGATIVE mg/dL

## 2012-06-27 NOTE — Progress Notes (Signed)
Anxious for labor. Irreg UCs and pelvic pressure same. Plans and s/sx labor reviewed. Lives in [place]. EFW 7#.

## 2012-06-27 NOTE — Patient Instructions (Signed)
Fetal Movement Counts Patient Name: __________________________________________________ Patient Due Date: ____________________ Kick counts is highly recommended in high risk pregnancies, but it is a good idea for every pregnant woman to do. Start counting fetal movements at 28 weeks of the pregnancy. Fetal movements increase after eating a full meal or eating or drinking something sweet (the blood sugar is higher). It is also important to drink plenty of fluids (well hydrated) before doing the count. Lie on your left side because it helps with the circulation or you can sit in a comfortable chair with your arms over your belly (abdomen) with no distractions around you. DOING THE COUNT  Try to do the count the same time of day each time you do it.  Mark the day and time, then see how long it takes for you to feel 10 movements (kicks, flutters, swishes, rolls). You should have at least 10 movements within 2 hours. You will most likely feel 10 movements in much less than 2 hours. If you do not, wait an hour and count again. After a couple of days you will see a pattern.  What you are looking for is a change in the pattern or not enough counts in 2 hours. Is it taking longer in time to reach 10 movements? SEEK MEDICAL CARE IF:  You feel less than 10 counts in 2 hours. Tried twice.  No movement in one hour.  The pattern is changing or taking longer each day to reach 10 counts in 2 hours.  You feel the baby is not moving as it usually does. Date: ____________ Movements: ____________ Start time: ____________ Finish time: ____________  Date: ____________ Movements: ____________ Start time: ____________ Finish time: ____________ Date: ____________ Movements: ____________ Start time: ____________ Finish time: ____________ Date: ____________ Movements: ____________ Start time: ____________ Finish time: ____________ Date: ____________ Movements: ____________ Start time: ____________ Finish time:  ____________ Date: ____________ Movements: ____________ Start time: ____________ Finish time: ____________ Date: ____________ Movements: ____________ Start time: ____________ Finish time: ____________ Date: ____________ Movements: ____________ Start time: ____________ Finish time: ____________  Date: ____________ Movements: ____________ Start time: ____________ Finish time: ____________ Date: ____________ Movements: ____________ Start time: ____________ Finish time: ____________ Date: ____________ Movements: ____________ Start time: ____________ Finish time: ____________ Date: ____________ Movements: ____________ Start time: ____________ Finish time: ____________ Date: ____________ Movements: ____________ Start time: ____________ Finish time: ____________ Date: ____________ Movements: ____________ Start time: ____________ Finish time: ____________ Date: ____________ Movements: ____________ Start time: ____________ Finish time: ____________  Date: ____________ Movements: ____________ Start time: ____________ Finish time: ____________ Date: ____________ Movements: ____________ Start time: ____________ Finish time: ____________ Date: ____________ Movements: ____________ Start time: ____________ Finish time: ____________ Date: ____________ Movements: ____________ Start time: ____________ Finish time: ____________ Date: ____________ Movements: ____________ Start time: ____________ Finish time: ____________ Date: ____________ Movements: ____________ Start time: ____________ Finish time: ____________ Date: ____________ Movements: ____________ Start time: ____________ Finish time: ____________  Date: ____________ Movements: ____________ Start time: ____________ Finish time: ____________ Date: ____________ Movements: ____________ Start time: ____________ Finish time: ____________ Date: ____________ Movements: ____________ Start time: ____________ Finish time: ____________ Date: ____________ Movements:  ____________ Start time: ____________ Finish time: ____________ Date: ____________ Movements: ____________ Start time: ____________ Finish time: ____________ Date: ____________ Movements: ____________ Start time: ____________ Finish time: ____________ Date: ____________ Movements: ____________ Start time: ____________ Finish time: ____________  Date: ____________ Movements: ____________ Start time: ____________ Finish time: ____________ Date: ____________ Movements: ____________ Start time: ____________ Finish time: ____________ Date: ____________ Movements: ____________ Start time: ____________ Finish time: ____________ Date: ____________ Movements:   ____________ Start time: ____________ Doreatha Martin time: ____________ Date: ____________ Movements: ____________ Start time: ____________ Doreatha Martin time: ____________ Date: ____________ Movements: ____________ Start time: ____________ Doreatha Martin time: ____________ Date: ____________ Movements: ____________ Start time: ____________ Doreatha Martin time: ____________  Date: ____________ Movements: ____________ Start time: ____________ Doreatha Martin time: ____________ Date: ____________ Movements: ____________ Start time: ____________ Doreatha Martin time: ____________ Date: ____________ Movements: ____________ Start time: ____________ Doreatha Martin time: ____________ Date: ____________ Movements: ____________ Start time: ____________ Doreatha Martin time: ____________ Date: ____________ Movements: ____________ Start time: ____________ Doreatha Martin time: ____________ Date: ____________ Movements: ____________ Start time: ____________ Doreatha Martin time: ____________ Date: ____________ Movements: ____________ Start time: ____________ Doreatha Martin time: ____________  Date: ____________ Movements: ____________ Start time: ____________ Doreatha Martin time: ____________ Date: ____________ Movements: ____________ Start time: ____________ Doreatha Martin time: ____________ Date: ____________ Movements: ____________ Start time: ____________ Doreatha Martin  time: ____________ Date: ____________ Movements: ____________ Start time: ____________ Doreatha Martin time: ____________ Date: ____________ Movements: ____________ Start time: ____________ Doreatha Martin time: ____________ Date: ____________ Movements: ____________ Start time: ____________ Doreatha Martin time: ____________ Date: ____________ Movements: ____________ Start time: ____________ Doreatha Martin time: ____________  Date: ____________ Movements: ____________ Start time: ____________ Doreatha Martin time: ____________ Date: ____________ Movements: ____________ Start time: ____________ Doreatha Martin time: ____________ Date: ____________ Movements: ____________ Start time: ____________ Doreatha Martin time: ____________ Date: ____________ Movements: ____________ Start time: ____________ Doreatha Martin time: ____________ Date: ____________ Movements: ____________ Start time: ____________ Doreatha Martin time: ____________ Date: ____________ Movements: ____________ Start time: ____________ Doreatha Martin time: ____________ Document Released: 08/03/2006 Document Revised: 09/26/2011 Document Reviewed: 02/03/2009 [employer] Patient Information 2013 [employer]. Pregnancy - Third Trimester The third trimester of pregnancy (the last 3 months) is a period of the most rapid growth for you and your baby. The baby approaches a length of 20 inches and a weight of 6 to 10 pounds. The baby is adding on fat and getting ready for life outside your body. While inside, babies have periods of sleeping and waking, suck their thumbs, and hiccups. You can often feel small contractions of the uterus. This is false labor. It is also called Braxton-Hicks contractions. This is like a practice for labor. The usual problems in this stage of pregnancy include more difficulty breathing, swelling of the hands and feet from water retention, and having to urinate more often because of the uterus and baby pressing on your bladder.  PRENATAL EXAMS  Blood work may continue to be done during prenatal exams.  These tests are done to check on your health and the probable health of your baby. Blood work is used to follow your blood levels (hemoglobin). Anemia (low hemoglobin) is common during pregnancy. Iron and vitamins are given to help prevent this. You may also continue to be checked for diabetes. Some of the past blood tests may be done again.  The size of the uterus is measured during each visit. This makes sure your baby is growing properly according to your pregnancy dates.  Your blood pressure is checked every prenatal visit. This is to make sure you are not getting toxemia.  Your urine is checked every prenatal visit for infection, diabetes and protein.  Your weight is checked at each visit. This is done to make sure gains are happening at the suggested rate and that you and your baby are growing normally.  Sometimes, an ultrasound is performed to confirm the position and the proper growth and development of the baby. This is a test done that bounces harmless sound waves off the baby so your caregiver can more accurately determine due dates.  Discuss the type of pain medication and  anesthesia you will have during your labor and delivery.  Discuss the possibility and anesthesia if a Cesarean Section might be necessary.  Inform your caregiver if there is any mental or physical violence at home. Sometimes, a specialized non-stress test, contraction stress test and biophysical profile are done to make sure the baby is not having a problem. Checking the amniotic fluid surrounding the baby is called an amniocentesis. The amniotic fluid is removed by sticking a needle into the belly (abdomen). This is sometimes done near the end of pregnancy if an early delivery is required. In this case, it is done to help make sure the baby's lungs are mature enough for the baby to live outside of the womb. If the lungs are not mature and it is unsafe to deliver the baby, an injection of cortisone medication is given  to the mother 1 to 2 days before the delivery. This helps the baby's lungs mature and makes it safer to deliver the baby. CHANGES OCCURING IN THE THIRD TRIMESTER OF PREGNANCY Your body goes through many changes during pregnancy. They vary from person to person. Talk to your caregiver about changes you notice and are concerned about.  During the last trimester, you have probably had an increase in your appetite. It is normal to have cravings for certain foods. This varies from person to person and pregnancy to pregnancy.  You may begin to get stretch marks on your hips, abdomen, and breasts. These are normal changes in the body during pregnancy. There are no exercises or medications to take which prevent this change.  Constipation may be treated with a stool softener or adding bulk to your diet. Drinking lots of fluids, fiber in vegetables, fruits, and whole grains are helpful.  Exercising is also helpful. If you have been very active up until your pregnancy, most of these activities can be continued during your pregnancy. If you have been less active, it is helpful to start an exercise program such as walking. Consult your caregiver before starting exercise programs.  Avoid all smoking, alcohol, un-prescribed drugs, herbs and "street drugs" during your pregnancy. These chemicals affect the formation and growth of the baby. Avoid chemicals throughout the pregnancy to ensure the delivery of a healthy infant.  Backache, varicose veins and hemorrhoids may develop or get worse.  You will tire more easily in the third trimester, which is normal.  The baby's movements may be stronger and more often.  You may become short of breath easily.  Your belly button may stick out.  A yellow discharge may leak from your breasts called colostrum.  You may have a bloody mucus discharge. This usually occurs a few days to a week before labor begins. HOME CARE INSTRUCTIONS   Keep your caregiver's  appointments. Follow your caregiver's instructions regarding medication use, exercise, and diet.  During pregnancy, you are providing food for you and your baby. Continue to eat regular, well-balanced meals. Choose foods such as meat, fish, milk and other low fat dairy products, vegetables, fruits, and whole-grain breads and cereals. Your caregiver will tell you of the ideal weight gain.  A physical sexual relationship may be continued throughout pregnancy if there are no other problems such as early (premature) leaking of amniotic fluid from the membranes, vaginal bleeding, or belly (abdominal) pain.  Exercise regularly if there are no restrictions. Check with your caregiver if you are unsure of the safety of your exercises. Greater weight gain will occur in the last 2 trimesters of pregnancy.  Exercising helps:  Control your weight.  Get you in shape for labor and delivery.  You lose weight after you deliver.  Rest a lot with legs elevated, or as needed for leg cramps or low back pain.  Wear a good support or jogging bra for breast tenderness during pregnancy. This may help if worn during sleep. Pads or tissues may be used in the bra if you are leaking colostrum.  Do not use hot tubs, steam rooms, or saunas.  Wear your seat belt when driving. This protects you and your baby if you are in an accident.  Avoid raw meat, cat litter boxes and soil used by cats. These carry germs that can cause birth defects in the baby.  It is easier to loose urine during pregnancy. Tightening up and strengthening the pelvic muscles will help with this problem. You can practice stopping your urination while you are going to the bathroom. These are the same muscles you need to strengthen. It is also the muscles you would use if you were trying to stop from passing gas. You can practice tightening these muscles up 10 times a set and repeating this about 3 times per day. Once you know what muscles to tighten up, do  not perform these exercises during urination. It is more likely to cause an infection by backing up the urine.  Ask for help if you have financial, counseling or nutritional needs during pregnancy. Your caregiver will be able to offer counseling for these needs as well as refer you for other special needs.  Make a list of emergency phone numbers and have them available.  Plan on getting help from family or friends when you go home from the hospital.  Make a trial run to the hospital.  Take prenatal classes with the father to understand, practice and ask questions about the labor and delivery.  Prepare the baby's room/nursery.  Do not travel out of the city unless it is absolutely necessary and with the advice of your caregiver.  Wear only low or no heal shoes to have better balance and prevent falling. MEDICATIONS AND DRUG USE IN PREGNANCY  Take prenatal vitamins as directed. The vitamin should contain 1 milligram of folic acid. Keep all vitamins out of reach of children. Only a couple vitamins or tablets containing iron may be fatal to a baby or young child when ingested.  Avoid use of all medications, including herbs, over-the-counter medications, not prescribed or suggested by your caregiver. Only take over-the-counter or prescription medicines for pain, discomfort, or fever as directed by your caregiver. Do not use aspirin, ibuprofen (Motrin, Advil, Nuprin) or naproxen (Aleve) unless OK'd by your caregiver.  Let your caregiver also know about herbs you may be using.  Alcohol is related to a number of birth defects. This includes fetal alcohol syndrome. All alcohol, in any form, should be avoided completely. Smoking will cause low birth rate and premature babies.  Street/illegal drugs are very harmful to the baby. They are absolutely forbidden. A baby born to an addicted mother will be addicted at birth. The baby will go through the same withdrawal an adult does. SEEK MEDICAL CARE  IF: You have any concerns or worries during your pregnancy. It is better to call with your questions if you feel they cannot wait, rather than worry about them. DECISIONS ABOUT CIRCUMCISION You may or may not know the sex of your baby. If you know your baby is a boy, it may be time to think  about circumcision. Circumcision is the removal of the foreskin of the penis. This is the skin that covers the sensitive end of the penis. There is no proven medical need for this. Often this decision is made on what is popular at the time or based upon religious beliefs and social issues. You can discuss these issues with your caregiver or pediatrician. SEEK IMMEDIATE MEDICAL CARE IF:   An unexplained oral temperature above 102 F (38.9 C) develops, or as your caregiver suggests.  You have leaking of fluid from the vagina (birth canal). If leaking membranes are suspected, take your temperature and tell your caregiver of this when you call.  There is vaginal spotting, bleeding or passing clots. Tell your caregiver of the amount and how many pads are used.  You develop a bad smelling vaginal discharge with a change in the color from clear to white.  You develop vomiting that lasts more than 24 hours.  You develop chills or fever.  You develop shortness of breath.  You develop burning on urination.  You loose more than 2 pounds of weight or gain more than 2 pounds of weight or as suggested by your caregiver.  You notice sudden swelling of your face, hands, and feet or legs.  You develop belly (abdominal) pain. Round ligament discomfort is a common non-cancerous (benign) cause of abdominal pain in pregnancy. Your caregiver still must evaluate you.  You develop a severe headache that does not go away.  You develop visual problems, blurred or double vision.  If you have not felt your baby move for more than 1 hour. If you think the baby is not moving as much as usual, eat something with sugar in it and  lie down on your left side for an hour. The baby should move at least 4 to 5 times per hour. Call right away if your baby moves less than that.  You fall, are in a car accident or any kind of trauma.  There is mental or physical violence at home. Document Released: 06/28/2001 Document Revised: 09/26/2011 Document Reviewed: 12/31/2008 [facility] Patient Information 2013 [place], [place].

## 2012-06-27 NOTE — Progress Notes (Signed)
P-90 

## 2012-07-04 ENCOUNTER — Ambulatory Visit (INDEPENDENT_AMBULATORY_CARE_PROVIDER_SITE_OTHER): Payer: Medicaid Other | Admitting: Obstetrics & Gynecology

## 2012-07-04 ENCOUNTER — Encounter: Payer: Medicaid Other | Admitting: Advanced Practice Midwife

## 2012-07-04 VITALS — BP 147/81 | Temp 97.1°F | Wt 250.9 lb

## 2012-07-04 DIAGNOSIS — Z3480 Encounter for supervision of other normal pregnancy, unspecified trimester: Secondary | ICD-10-CM

## 2012-07-04 DIAGNOSIS — O361190 Maternal care for Anti-A sensitization, unspecified trimester, not applicable or unspecified: Secondary | ICD-10-CM

## 2012-07-04 DIAGNOSIS — O0930 Supervision of pregnancy with insufficient antenatal care, unspecified trimester: Secondary | ICD-10-CM

## 2012-07-04 LAB — POCT URINALYSIS DIP (DEVICE)
Glucose, UA: 100 mg/dL — AB
Hgb urine dipstick: NEGATIVE
Nitrite: NEGATIVE
Urobilinogen, UA: 0.2 mg/dL (ref 0.0–1.0)

## 2012-07-04 NOTE — Patient Instructions (Addendum)
Pregnancy - Third Trimester The third trimester of pregnancy (the last 3 months) is a period of the most rapid growth for you and your baby. The baby approaches a length of 20 inches and a weight of 6 to 10 pounds. The baby is adding on fat and getting ready for life outside your body. While inside, babies have periods of sleeping and waking, suck their thumbs, and hiccups. You can often feel small contractions of the uterus. This is false labor. It is also called Braxton-Hicks contractions. This is like a practice for labor. The usual problems in this stage of pregnancy include more difficulty breathing, swelling of the hands and feet from water retention, and having to urinate more often because of the uterus and baby pressing on your bladder.  PRENATAL EXAMS  Blood work may continue to be done during prenatal exams. These tests are done to check on your health and the probable health of your baby. Blood work is used to follow your blood levels (hemoglobin). Anemia (low hemoglobin) is common during pregnancy. Iron and vitamins are given to help prevent this. You may also continue to be checked for diabetes. Some of the past blood tests may be done again.  The size of the uterus is measured during each visit. This makes sure your baby is growing properly according to your pregnancy dates.  Your blood pressure is checked every prenatal visit. This is to make sure you are not getting toxemia.  Your urine is checked every prenatal visit for infection, diabetes and protein.  Your weight is checked at each visit. This is done to make sure gains are happening at the suggested rate and that you and your baby are growing normally.  Sometimes, an ultrasound is performed to confirm the position and the proper growth and development of the baby. This is a test done that bounces harmless sound waves off the baby so your caregiver can more accurately determine due dates.  Discuss the type of pain medication  and anesthesia you will have during your labor and delivery.  Discuss the possibility and anesthesia if a Cesarean Section might be necessary.  Inform your caregiver if there is any mental or physical violence at home. Sometimes, a specialized non-stress test, contraction stress test and biophysical profile are done to make sure the baby is not having a problem. Checking the amniotic fluid surrounding the baby is called an amniocentesis. The amniotic fluid is removed by sticking a needle into the belly (abdomen). This is sometimes done near the end of pregnancy if an early delivery is required. In this case, it is done to help make sure the baby's lungs are mature enough for the baby to live outside of the womb. If the lungs are not mature and it is unsafe to deliver the baby, an injection of cortisone medication is given to the mother 1 to 2 days before the delivery. This helps the baby's lungs mature and makes it safer to deliver the baby. CHANGES OCCURING IN THE THIRD TRIMESTER OF PREGNANCY Your body goes through many changes during pregnancy. They vary from person to person. Talk to your caregiver about changes you notice and are concerned about.  During the last trimester, you have probably had an increase in your appetite. It is normal to have cravings for certain foods. This varies from person to person and pregnancy to pregnancy.  You may begin to get stretch marks on your hips, abdomen, and breasts. These are normal changes in the body   during pregnancy. There are no exercises or medications to take which prevent this change.  Constipation may be treated with a stool softener or adding bulk to your diet. Drinking lots of fluids, fiber in vegetables, fruits, and whole grains are helpful.  Exercising is also helpful. If you have been very active up until your pregnancy, most of these activities can be continued during your pregnancy. If you have been less active, it is helpful to start an  exercise program such as walking. Consult your caregiver before starting exercise programs.  Avoid all smoking, alcohol, un-prescribed drugs, herbs and "street drugs" during your pregnancy. These chemicals affect the formation and growth of the baby. Avoid chemicals throughout the pregnancy to ensure the delivery of a healthy infant.  Backache, varicose veins and hemorrhoids may develop or get worse.  You will tire more easily in the third trimester, which is normal.  The baby's movements may be stronger and more often.  You may become short of breath easily.  Your belly button may stick out.  A yellow discharge may leak from your breasts called colostrum.  You may have a bloody mucus discharge. This usually occurs a few days to a week before labor begins. HOME CARE INSTRUCTIONS   Keep your caregiver's appointments. Follow your caregiver's instructions regarding medication use, exercise, and diet.  During pregnancy, you are providing food for you and your baby. Continue to eat regular, well-balanced meals. Choose foods such as meat, fish, milk and other low fat dairy products, vegetables, fruits, and whole-grain breads and cereals. Your caregiver will tell you of the ideal weight gain.  A physical sexual relationship may be continued throughout pregnancy if there are no other problems such as early (premature) leaking of amniotic fluid from the membranes, vaginal bleeding, or belly (abdominal) pain.  Exercise regularly if there are no restrictions. Check with your caregiver if you are unsure of the safety of your exercises. Greater weight gain will occur in the last 2 trimesters of pregnancy. Exercising helps:  Control your weight.  Get you in shape for labor and delivery.  You lose weight after you deliver.  Rest a lot with legs elevated, or as needed for leg cramps or low back pain.  Wear a good support or jogging bra for breast tenderness during pregnancy. This may help if worn  during sleep. Pads or tissues may be used in the bra if you are leaking colostrum.  Do not use hot tubs, steam rooms, or saunas.  Wear your seat belt when driving. This protects you and your baby if you are in an accident.  Avoid raw meat, cat litter boxes and soil used by cats. These carry germs that can cause birth defects in the baby.  It is easier to loose urine during pregnancy. Tightening up and strengthening the pelvic muscles will help with this problem. You can practice stopping your urination while you are going to the bathroom. These are the same muscles you need to strengthen. It is also the muscles you would use if you were trying to stop from passing gas. You can practice tightening these muscles up 10 times a set and repeating this about 3 times per day. Once you know what muscles to tighten up, do not perform these exercises during urination. It is more likely to cause an infection by backing up the urine.  Ask for help if you have financial, counseling or nutritional needs during pregnancy. Your caregiver will be able to offer counseling for these   needs as well as refer you for other special needs.  Make a list of emergency phone numbers and have them available.  Plan on getting help from family or friends when you go home from the hospital.  Make a trial run to the hospital.  Take prenatal classes with the father to understand, practice and ask questions about the labor and delivery.  Prepare the baby's room/nursery.  Do not travel out of the city unless it is absolutely necessary and with the advice of your caregiver.  Wear only low or no heal shoes to have better balance and prevent falling. MEDICATIONS AND DRUG USE IN PREGNANCY  Take prenatal vitamins as directed. The vitamin should contain 1 milligram of folic acid. Keep all vitamins out of reach of children. Only a couple vitamins or tablets containing iron may be fatal to a baby or young child when  ingested.  Avoid use of all medications, including herbs, over-the-counter medications, not prescribed or suggested by your caregiver. Only take over-the-counter or prescription medicines for pain, discomfort, or fever as directed by your caregiver. Do not use aspirin, ibuprofen (Motrin, Advil, Nuprin) or naproxen (Aleve) unless OK'd by your caregiver.  Let your caregiver also know about herbs you may be using.  Alcohol is related to a number of birth defects. This includes fetal alcohol syndrome. All alcohol, in any form, should be avoided completely. Smoking will cause low birth rate and premature babies.  Street/illegal drugs are very harmful to the baby. They are absolutely forbidden. A baby born to an addicted mother will be addicted at birth. The baby will go through the same withdrawal an adult does. SEEK MEDICAL CARE IF: You have any concerns or worries during your pregnancy. It is better to call with your questions if you feel they cannot wait, rather than worry about them. DECISIONS ABOUT CIRCUMCISION You may or may not know the sex of your baby. If you know your baby is a boy, it may be time to think about circumcision. Circumcision is the removal of the foreskin of the penis. This is the skin that covers the sensitive end of the penis. There is no proven medical need for this. Often this decision is made on what is popular at the time or based upon religious beliefs and social issues. You can discuss these issues with your caregiver or pediatrician. SEEK IMMEDIATE MEDICAL CARE IF:   An unexplained oral temperature above 102 F (38.9 C) develops, or as your caregiver suggests.  You have leaking of fluid from the vagina (birth canal). If leaking membranes are suspected, take your temperature and tell your caregiver of this when you call.  There is vaginal spotting, bleeding or passing clots. Tell your caregiver of the amount and how many pads are used.  You develop a bad smelling  vaginal discharge with a change in the color from clear to white.  You develop vomiting that lasts more than 24 hours.  You develop chills or fever.  You develop shortness of breath.  You develop burning on urination.  You loose more than 2 pounds of weight or gain more than 2 pounds of weight or as suggested by your caregiver.  You notice sudden swelling of your face, hands, and feet or legs.  You develop belly (abdominal) pain. Round ligament discomfort is a common non-cancerous (benign) cause of abdominal pain in pregnancy. Your caregiver still must evaluate you.  You develop a severe headache that does not go away.  You develop visual   problems, blurred or double vision.  If you have not felt your baby move for more than 1 hour. If you think the baby is not moving as much as usual, eat something with sugar in it and lie down on your left side for an hour. The baby should move at least 4 to 5 times per hour. Call right away if your baby moves less than that.  You fall, are in a car accident or any kind of trauma.  There is mental or physical violence at home. Document Released: 06/28/2001 Document Revised: 09/26/2011 Document Reviewed: 12/31/2008 [employer] Patient Information 2013 [employer].  Breastfeeding Deciding to breastfeed is one of the best choices you can make for you and your baby. The information that follows gives a brief overview of the benefits of breastfeeding as well as common topics surrounding breastfeeding. BENEFITS OF BREASTFEEDING For the baby  The first milk (colostrum) helps the baby's digestive system function better.   There are antibodies in the mother's milk that help the baby fight off infections.   The baby has a lower incidence of asthma, allergies, and sudden infant death syndrome (SIDS).   The nutrients in breast milk are better for the baby than infant formulas, and breast milk helps the baby's brain grow better.   Babies who  breastfeed have less gas, colic, and constipation.  For the mother  Breastfeeding helps develop a very special bond between the mother and her baby.   Breastfeeding is convenient, always available at the correct temperature, and costs nothing.   Breastfeeding burns calories in the mother and helps her lose weight that was gained during pregnancy.   Breastfeeding makes the uterus contract back down to normal size faster and slows bleeding following delivery.   Breastfeeding mothers have a lower risk of developing breast cancer.  BREASTFEEDING FREQUENCY  A healthy, full-term baby may breastfeed as often as every hour or space his or her feedings to every 3 hours.   Watch your baby for signs of hunger. Nurse your baby if he or she shows signs of hunger. How often you nurse will vary from baby to baby.   Nurse as often as the baby requests, or when you feel the need to reduce the fullness of your breasts.   Awaken the baby if it has been 3 4 hours since the last feeding.   Frequent feeding will help the mother make more milk and will help prevent problems, such as sore nipples and engorgement of the breasts.  BABY'S POSITION AT THE BREAST  Whether lying down or sitting, be sure that the baby's tummy is facing your tummy.   Support the breast with 4 fingers underneath the breast and the thumb above. Make sure your fingers are well away from the nipple and baby's mouth.   Stroke the baby's lips gently with your finger or nipple.   When the baby's mouth is open wide enough, place all of your nipple and as much of the areola as possible into your baby's mouth.   Pull the baby in close so the tip of the nose and the baby's cheeks touch the breast during the feeding.  FEEDINGS AND SUCTION  The length of each feeding varies from baby to baby and from feeding to feeding.   The baby must suck about 2 3 minutes for your milk to get to him or her. This is called a "let down."  For this reason, allow the baby to feed on each breast as   long as he or she wants. Your baby will end the feeding when he or she has received the right balance of nutrients.   To break the suction, put your finger into the corner of the baby's mouth and slide it between his or her gums before removing your breast from his or her mouth. This will help prevent sore nipples.  HOW TO TELL WHETHER YOUR BABY IS GETTING ENOUGH BREAST MILK. Wondering whether or not your baby is getting enough milk is a common concern among mothers. You can be assured that your baby is getting enough milk if:   Your baby is actively sucking and you hear swallowing.   Your baby seems relaxed and satisfied after a feeding.   Your baby nurses at least 8 12 times in a 24 hour time period. Nurse your baby until he or she unlatches or falls asleep at the first breast (at least 10 20 minutes), then offer the second side.   Your baby is wetting 5 6 disposable diapers (6 8 cloth diapers) in a 24 hour period by 5 6 days of age.   Your baby is having at least 3 4 stools every 24 hours for the first 6 weeks. The stool should be soft and yellow.   Your baby should gain 4 7 ounces per week after he or she is 4 days old.   Your breasts feel softer after nursing.  REDUCING BREAST ENGORGEMENT  In the first week after your baby is born, you may experience signs of breast engorgement. When breasts are engorged, they feel heavy, warm, full, and may be tender to the touch. You can reduce engorgement if you:   Nurse frequently, every 2 3 hours. Mothers who breastfeed early and often have fewer problems with engorgement.   Place light ice packs on your breasts for 10 20 minutes between feedings. This reduces swelling. Wrap the ice packs in a lightweight towel to protect your skin. Bags of frozen vegetables work well for this purpose.   Take a warm shower or apply warm, moist heat to your breast for 5 10 minutes just before  each feeding. This increases circulation and helps the milk flow.   Gently massage your breast before and during the feeding. Using your finger tips, massage from the chest wall towards your nipple in a circular motion.   Make sure that the baby empties at least one breast at every feeding before switching sides.   Use a breast pump to empty the breasts if your baby is sleepy or not nursing well. You may also want to pump if you are returning to work oryou feel you are getting engorged.   Avoid bottle feeds, pacifiers, or supplemental feedings of water or juice in place of breastfeeding. Breast milk is all the food your baby needs. It is not necessary for your baby to have water or formula. In fact, to help your breasts make more milk, it is best not to give your baby supplemental feedings during the early weeks.   Be sure the baby is latched on and positioned properly while breastfeeding.   Wear a supportive bra, avoiding underwire styles.   Eat a balanced diet with enough fluids.   Rest often, relax, and take your prenatal vitamins to prevent fatigue, stress, and anemia.  If you follow these suggestions, your engorgement should improve in 24 48 hours. If you are still experiencing difficulty, call your lactation consultant or caregiver.  CARING FOR YOURSELF Take care of your   breasts  Bathe or shower daily.   Avoid using soap on your nipples.   Start feedings on your left breast at one feeding and on your right breast at the next feeding.   You will notice an increase in your milk supply 2 5 days after delivery. You may feel some discomfort from engorgement, which makes your breasts very firm and often tender. Engorgement "peaks" out within 24 48 hours. In the meantime, apply warm moist towels to your breasts for 5 10 minutes before feeding. Gentle massage and expression of some milk before feeding will soften your breasts, making it easier for your baby to latch on.    Wear a well-fitting nursing bra, and air dry your nipples for a 3 4minutes after each feeding.   Only use cotton bra pads.   Only use pure lanolin on your nipples after nursing. You do not need to wash it off before feeding the baby again. Another option is to express a few drops of breast milk and gently massage it into your nipples.  Take care of yourself  Eat well-balanced meals and nutritious snacks.   Drinking milk, fruit juice, and water to satisfy your thirst (about 8 glasses a day).   Get plenty of rest.  Avoid foods that you notice affect the baby in a bad way.  SEEK MEDICAL CARE IF:   You have difficulty with breastfeeding and need help.   You have a hard, red, sore area on your breast that is accompanied by a fever.   Your baby is too sleepy to eat well or is having trouble sleeping.   Your baby is wetting less than 6 diapers a day, by 5 days of age.   Your baby's skin or white part of his or her eyes is more yellow than it was in the hospital.   You feel depressed.  Document Released: 07/04/2005 Document Revised: 01/03/2012 Document Reviewed: 10/02/2011 [employer] Patient Information 2013 [employer].  

## 2012-07-04 NOTE — Progress Notes (Signed)
Repeat BP 130/78.  D/W pt breastfeeding.  She is going to consider nursing.  Reviewed labor precautions.  Pt denies HA, visual changes.  +FM, -VB,or LOF  occ ctx.

## 2012-07-04 NOTE — Progress Notes (Signed)
P - 97 

## 2012-07-13 ENCOUNTER — Inpatient Hospital Stay (HOSPITAL_COMMUNITY)
Admission: AD | Admit: 2012-07-13 | Discharge: 2012-07-13 | Disposition: A | Discharge status: Home / Self Care | Payer: Medicaid Other | Source: Ambulatory Visit | Attending: Obstetrics & Gynecology | Admitting: Obstetrics & Gynecology

## 2012-07-13 ENCOUNTER — Encounter (HOSPITAL_COMMUNITY): Payer: Self-pay | Admitting: *Deleted

## 2012-07-13 DIAGNOSIS — O479 False labor, unspecified: Secondary | ICD-10-CM | POA: Insufficient documentation

## 2012-07-13 MED ORDER — FENTANYL CITRATE 0.05 MG/ML IJ SOLN
100.0000 ug | Freq: Once | INTRAMUSCULAR | Status: AC
  Administered 2012-07-13: 100 ug via INTRAMUSCULAR
  Filled 2012-07-13: qty 2

## 2012-07-13 NOTE — MAU Note (Signed)
uc's since 0700 this a.m., spotting since last night, denies LOF or heavy bleeding.

## 2012-07-13 NOTE — MAU Provider Note (Signed)
  History     CSN: [id number]  Arrival date and time: 07/13/12 1057   None     Chief Complaint  Patient presents with  . Labor Eval   HPI Rachel Rachel Clay, [id number], [id number] who presents with contractions and spooting. Pt is having contractions since last night, moderate to severe with sppoting. She denies gush of water. Good fetal movement.   Past Medical History  Diagnosis Date  . Asthma     childhood    Past Surgical History  Procedure Date  . Nasal sinus surgery     Family History  Problem Relation Age of Onset  . Hypertension Mother   . Sleep apnea Daughter     History  Substance Use Topics  . Smoking status: Former Games developer  . Smokeless tobacco: Never Used  . Alcohol Use: No     Comment: occasional    Allergies: No Known Allergies  Prescriptions prior to admission  Medication Sig Dispense Refill  . Pediatric Multivit-Minerals-C (FLINTSTONES GUMMIES PO) Take 2 each by mouth daily.      Marland Kitchen triamcinolone (KENALOG) 0.1 % cream Apply 1 application topically 2 (two) times daily as needed. For eczema        Review of Systems  Constitutional: Negative for fever.  Gastrointestinal: Negative for nausea.  Genitourinary: Negative for dysuria.  Neurological: Negative for headaches.   Physical Exam   Blood pressure 131/76, pulse 97, temperature 98.6 F (37 C), temperature source Oral, resp. rate 18, last menstrual period 10/12/2011.  Physical Exam  Constitutional: She appears well-developed.  HENT:  Head: Normocephalic.  Neck: Normal range of motion.  Cardiovascular: Normal rate.   Respiratory: Effort normal.   Dilation: 3 Effacement (%): 70 Cervical Position: Posterior Station: -3 Presentation: Vertex Exam by:: Dorrene German RN  FHT: 150-160, moderate variability, accels presents, no decels MAU Course  Procedures  MDM Cervical check- no change after one hour Pain meds.  Assessment and Plan  Rachel Clay is 21yo [id number] with GA [redacted]w[redacted]d who presents with  contractions - No cervical change after one hour monitoring. Contractions are irregular now. Patient is stable to be discharge Return if symptoms worsen Labor precautions discussed.  Governor Specking 07/13/2012, 11:32 AM   I have seen the patient with the resident/student and agree with the above.

## 2012-07-16 ENCOUNTER — Encounter: Payer: Medicaid Other | Admitting: Obstetrics and Gynecology

## 2012-07-17 NOTE — MAU Provider Note (Signed)
Attestation of Attending Supervision of Advanced Practitioner (CNM/NP): Evaluation and management procedures were performed by the Advanced Practitioner under my supervision and collaboration. I have reviewed the Advanced Practitioner's note and chart, and I agree with the management and plan.  Jacob Cicero H. 9:15 PM   

## 2012-07-23 ENCOUNTER — Encounter: Payer: Medicaid Other | Admitting: Obstetrics and Gynecology

## 2012-08-08 ENCOUNTER — Emergency Department (HOSPITAL_BASED_OUTPATIENT_CLINIC_OR_DEPARTMENT_OTHER)
Admission: EM | Admit: 2012-08-08 | Discharge: 2012-08-08 | Disposition: A | Discharge status: Home / Self Care | Payer: Medicaid Other | Attending: Emergency Medicine | Admitting: Emergency Medicine

## 2012-08-08 ENCOUNTER — Encounter (HOSPITAL_BASED_OUTPATIENT_CLINIC_OR_DEPARTMENT_OTHER): Payer: Self-pay | Admitting: Family Medicine

## 2012-08-08 DIAGNOSIS — Z87891 Personal history of nicotine dependence: Secondary | ICD-10-CM | POA: Insufficient documentation

## 2012-08-08 DIAGNOSIS — Z3202 Encounter for pregnancy test, result negative: Secondary | ICD-10-CM | POA: Insufficient documentation

## 2012-08-08 DIAGNOSIS — R197 Diarrhea, unspecified: Secondary | ICD-10-CM | POA: Insufficient documentation

## 2012-08-08 DIAGNOSIS — K5289 Other specified noninfective gastroenteritis and colitis: Secondary | ICD-10-CM | POA: Insufficient documentation

## 2012-08-08 DIAGNOSIS — K529 Noninfective gastroenteritis and colitis, unspecified: Secondary | ICD-10-CM

## 2012-08-08 DIAGNOSIS — Z8709 Personal history of other diseases of the respiratory system: Secondary | ICD-10-CM | POA: Insufficient documentation

## 2012-08-08 LAB — COMPREHENSIVE METABOLIC PANEL
Albumin: 3.9 g/dL (ref 3.5–5.2)
BUN: 12 mg/dL (ref 6–23)
Creatinine, Ser: 0.8 mg/dL (ref 0.50–1.10)
GFR calc Af Amer: >90 mL/min (ref >90)
Glucose, Bld: 100 mg/dL — ABNORMAL HIGH (ref 70–99)
Total Protein: 7.7 g/dL (ref 6.0–8.3)

## 2012-08-08 LAB — URINALYSIS, ROUTINE W REFLEX MICROSCOPIC
Hgb urine dipstick: NEGATIVE
Ketones, ur: NEGATIVE mg/dL
Protein, ur: NEGATIVE mg/dL
Urobilinogen, UA: 1 mg/dL (ref 0.0–1.0)

## 2012-08-08 LAB — URINE MICROSCOPIC-ADD ON

## 2012-08-08 LAB — CBC WITH DIFFERENTIAL/PLATELET
Basophils Absolute: 0 10*3/uL (ref 0.0–0.1)
Eosinophils Absolute: 0.1 10*3/uL (ref 0.0–0.7)
HCT: 37.2 % (ref 36.0–46.0)
Lymphocytes Relative: 22 % (ref 12–46)
Lymphs Abs: 1.3 10*3/uL (ref 0.7–4.0)
MCH: 22.3 pg — ABNORMAL LOW (ref 26.0–34.0)
MCHC: 32 g/dL (ref 30.0–36.0)
MCV: 69.7 fL — ABNORMAL LOW (ref 78.0–100.0)
Monocytes Absolute: 0.5 10*3/uL (ref 0.1–1.0)
Neutro Abs: 4.1 10*3/uL (ref 1.7–7.7)
RDW: 17 % — ABNORMAL HIGH (ref 11.5–15.5)

## 2012-08-08 LAB — LIPASE, BLOOD: Lipase: 16 U/L (ref 11–59)

## 2012-08-08 LAB — PREGNANCY, URINE: Preg Test, Ur: NEGATIVE

## 2012-08-08 MED ORDER — SODIUM CHLORIDE 0.9 % IV BOLUS (SEPSIS)
1000.0000 mL | Freq: Once | INTRAVENOUS | Status: AC
  Administered 2012-08-08: 1000 mL via INTRAVENOUS

## 2012-08-08 MED ORDER — ONDANSETRON HCL 4 MG PO TABS: 4.0000 mg | ORAL_TABLET | Freq: Four times a day (QID) | ORAL | Status: DC

## 2012-08-08 MED ORDER — ONDANSETRON HCL 4 MG/2ML IJ SOLN
4.0000 mg | Freq: Once | INTRAMUSCULAR | Status: AC
  Administered 2012-08-08: 4 mg via INTRAVENOUS
  Filled 2012-08-08: qty 2

## 2012-08-08 NOTE — ED Provider Notes (Signed)
History  This chart was scribed for Glynn Octave, MD by Bennett Scrape, ED Scribe. This patient was seen in room MH05/MH05 and the patient's care was started at 4:53 PM.  CSN: [id number]  Arrival date & time 08/08/12  1626   First MD Initiated Contact with Patient 08/08/12 1653      Chief Complaint  Patient presents with  . Emesis  . Diarrhea    The history is provided by the patient. No language interpreter was used.    Rachel Clay is a 22 y.o. female who presents to the Emergency Department complaining of approximately 17 hours of sudden onset, gradually improving, intermittent episodes of emesis with associated nausea and mild non-bloody diarrhea. She reports that her last emesis episode was 5 hours ago and her last episode of diarrhea was more than 12 hours ago. She reports that her one year old daughter has been sick recently with the same symptoms. She denies fevers, urinary symptoms and abdominal pain as associated symptoms. She does not have a h/o chronic medical conditions and denies being on daily medications. Her LNMP was three week ago. She is a former smoker and occasional alcohol user.  PCP is Dr. Marina Goodell  Past Medical History  Diagnosis Date  . Asthma     childhood    Past Surgical History  Procedure Date  . Nasal sinus surgery     Family History  Problem Relation Age of Onset  . Hypertension Mother   . Sleep apnea Daughter     History  Substance Use Topics  . Smoking status: Former Games developer  . Smokeless tobacco: Never Used  . Alcohol Use: No     Comment: occasional    OB History    Grav Para Term Preterm Abortions TAB SAB Ect Mult Living   3 2 2       2       Review of Systems  A complete 10 system review of systems was obtained and all systems are negative except as noted in the HPI and PMH.   Allergies  Review of patient's allergies indicates no known allergies.  Home Medications   Current Outpatient Rx  Name  Route  Sig  Dispense   Refill  . FLINTSTONES GUMMIES PO   Oral   Take 2 each by mouth daily.         . TRIAMCINOLONE ACETONIDE 0.1 % EX CREA   Topical   Apply 1 application topically 2 (two) times daily as needed. For eczema           Triage Vitals: BP 116/69  Pulse 112  Temp 98.4 F (36.9 C) (Oral)  Resp 16  SpO2 99%  LMP 09/28/2011  Breastfeeding? Unknown  Physical Exam  Nursing note and vitals reviewed. Constitutional: She is oriented to person, place, and time. She appears well-developed and well-nourished. No distress.  HENT:  Head: Normocephalic and atraumatic.  Mouth/Throat: Oropharynx is clear and moist.       Moist MM  Eyes: Conjunctivae normal and EOM are normal. Pupils are equal, round, and reactive to light.  Neck: Neck supple. No tracheal deviation present.  Cardiovascular: Regular rhythm.        Mild tachycardia  Pulmonary/Chest: Effort normal and breath sounds normal. No respiratory distress.  Abdominal: Soft. There is no tenderness.  Musculoskeletal: Normal range of motion. She exhibits no edema.  Neurological: She is alert and oriented to person, place, and time.  Skin: Skin is warm and dry.  Psychiatric: She has  a normal mood and affect. Her behavior is normal.    ED Course  Procedures (including critical care time)  DIAGNOSTIC STUDIES: Oxygen Saturation is 99% on room air, normal by my interpretation.    COORDINATION OF CARE: 5:07 PM-Discussed treatment plan which includes IV fluids, antiemetic, CBC panel, and UA with pt at bedside and pt agreed to plan.   5:15 PM- Ordered 1,000 mL of bolus and 4 mg Zofran injection.  6:30 PM-Pt rechecked and feels improved. Will try PO challenge.  Labs Reviewed  URINALYSIS, ROUTINE W REFLEX MICROSCOPIC - Abnormal; Notable for the following:    Color, Urine AMBER (*)  BIOCHEMICALS MAY BE AFFECTED BY COLOR   APPearance CLOUDY (*)     Specific Gravity, Urine 1.031 (*)     Bilirubin Urine SMALL (*)     Leukocytes, UA SMALL (*)      All other components within normal limits  CBC WITH DIFFERENTIAL - Abnormal; Notable for the following:    RBC 5.34 (*)     Hemoglobin 11.9 (*)     MCV 69.7 (*)     MCH 22.3 (*)     RDW 17.0 (*)     All other components within normal limits  COMPREHENSIVE METABOLIC PANEL - Abnormal; Notable for the following:    Glucose, Bld 100 (*)     All other components within normal limits  URINE MICROSCOPIC-ADD ON - Abnormal; Notable for the following:    Squamous Epithelial / LPF MANY (*)     Bacteria, UA MANY (*)     All other components within normal limits  PREGNANCY, URINE  LIPASE, BLOOD  URINE CULTURE   No results found.   No diagnosis found.    MDM  One day of nausea, vomiting, diarrhea. Sick contacts at home with children. Denies abdominal pain, fever, urinary symptoms or vaginal symptoms.  Nontoxic appearing, mild tachycardia. Abdomen soft and nontender  Orthostatic positive. Patient given IV fluids and antiemetics. No vomiting or diarrhea in the ED. UA is contaminated. Labs otherwise unremarkable.  Suspect viral gastroenteritis. Patient is tolerating by mouth. She stable for outpatient follow up with Zofran as needed. I personally performed the services described in this documentation, which was scribed in my presence. The recorded information has been reviewed and is accurate.       Glynn Octave, MD 08/08/12 [phone]

## 2012-08-08 NOTE — ED Notes (Addendum)
Pt c/o vomiting and diarrhea since last night. Reports 5 episodes. Also c/o headache. Pt sts last episode this morning.

## 2012-08-10 LAB — URINE CULTURE

## 2012-09-01 ENCOUNTER — Other Ambulatory Visit: Payer: Self-pay

## 2012-09-05 DIAGNOSIS — R12 Heartburn: Secondary | ICD-10-CM | POA: Insufficient documentation

## 2012-09-05 DIAGNOSIS — F17200 Nicotine dependence, unspecified, uncomplicated: Secondary | ICD-10-CM | POA: Insufficient documentation

## 2012-09-06 ENCOUNTER — Emergency Department (HOSPITAL_BASED_OUTPATIENT_CLINIC_OR_DEPARTMENT_OTHER)
Admission: EM | Admit: 2012-09-06 | Discharge: 2012-09-06 | Discharge status: Against Medical Advice | Payer: Medicaid Other | Attending: Emergency Medicine | Admitting: Emergency Medicine

## 2012-09-06 ENCOUNTER — Encounter (HOSPITAL_BASED_OUTPATIENT_CLINIC_OR_DEPARTMENT_OTHER): Payer: Self-pay | Admitting: *Deleted

## 2012-09-06 NOTE — ED Notes (Signed)
Pt c/o heart burn x 1 day

## 2012-09-06 NOTE — ED Notes (Signed)
Pt sts she does not want to wait any longer and left the ER per registration.

## 2012-09-09 ENCOUNTER — Emergency Department (HOSPITAL_BASED_OUTPATIENT_CLINIC_OR_DEPARTMENT_OTHER): Payer: Medicaid Other

## 2012-09-09 ENCOUNTER — Encounter (HOSPITAL_BASED_OUTPATIENT_CLINIC_OR_DEPARTMENT_OTHER): Payer: Self-pay | Admitting: *Deleted

## 2012-09-09 ENCOUNTER — Emergency Department (HOSPITAL_BASED_OUTPATIENT_CLINIC_OR_DEPARTMENT_OTHER)
Admission: EM | Admit: 2012-09-09 | Discharge: 2012-09-09 | Disposition: A | Discharge status: Home / Self Care | Payer: Medicaid Other | Attending: Emergency Medicine | Admitting: Emergency Medicine

## 2012-09-09 DIAGNOSIS — R0789 Other chest pain: Secondary | ICD-10-CM

## 2012-09-09 DIAGNOSIS — J45909 Unspecified asthma, uncomplicated: Secondary | ICD-10-CM | POA: Insufficient documentation

## 2012-09-09 DIAGNOSIS — R0602 Shortness of breath: Secondary | ICD-10-CM | POA: Insufficient documentation

## 2012-09-09 DIAGNOSIS — F17200 Nicotine dependence, unspecified, uncomplicated: Secondary | ICD-10-CM | POA: Insufficient documentation

## 2012-09-09 DIAGNOSIS — R071 Chest pain on breathing: Secondary | ICD-10-CM | POA: Insufficient documentation

## 2012-09-09 LAB — CBC WITH DIFFERENTIAL/PLATELET
Basophils Absolute: 0 10*3/uL (ref 0.0–0.1)
Basophils Relative: 0 % (ref 0–1)
HCT: 33.4 % — ABNORMAL LOW (ref 36.0–46.0)
Hemoglobin: 10.9 g/dL — ABNORMAL LOW (ref 12.0–15.0)
Lymphocytes Relative: 40 % (ref 12–46)
MCHC: 32.6 g/dL (ref 30.0–36.0)
Monocytes Absolute: 0.5 10*3/uL (ref 0.1–1.0)
Neutro Abs: 3.2 10*3/uL (ref 1.7–7.7)
Neutrophils Relative %: 44 % (ref 43–77)
RDW: 17.1 % — ABNORMAL HIGH (ref 11.5–15.5)
WBC: 7.4 10*3/uL (ref 4.0–10.5)

## 2012-09-09 LAB — BASIC METABOLIC PANEL
CO2: 24 mEq/L (ref 19–32)
Chloride: 105 mEq/L (ref 96–112)
Creatinine, Ser: 1 mg/dL (ref 0.50–1.10)
GFR calc Af Amer: >90 mL/min (ref >90)
Potassium: 4.2 mEq/L (ref 3.5–5.1)

## 2012-09-09 LAB — D-DIMER, QUANTITATIVE: D-Dimer, Quant: 0.38 ug/mL-FEU (ref 0.00–0.48)

## 2012-09-09 MED ORDER — IBUPROFEN 800 MG PO TABS: 800.0000 mg | ORAL_TABLET | Freq: Three times a day (TID) | ORAL | Status: DC

## 2012-09-09 NOTE — ED Notes (Signed)
C/o pain to ant chest with inspiration. States cold symptoms that started yesterday. Dry cough noted. Denies any fevers. Describes pain as sharp.

## 2012-09-09 NOTE — ED Provider Notes (Signed)
History     CSN: [id number]  Arrival date & time 09/09/12  2136   First MD Initiated Contact with Patient 09/09/12 2214      Chief Complaint  Patient presents with  . pain to chest with inspiration     (Consider location/radiation/quality/duration/timing/severity/associated sxs/prior treatment) HPI Comments: Patient is a 22 year old with a past medical history of asthma who presents with chest pain that started suddenly one week ago. Patient reports sudden onset of sharp chest pain that was located in the right chest and without radiation. The patient reports associated SOB. The pain is made worse with exertion. No alleviating factors. Patient denies fever, headache, visual changes, vomiting, diarrhea, abdominal pain, numbness/tingling. Patient reports using mirena for birth control. Patient denies recent travel, surgery, or immobilization.     Past Medical History  Diagnosis Date  . Asthma     childhood    Past Surgical History  Procedure Laterality Date  . Nasal sinus surgery      Family History  Problem Relation Age of Onset  . Hypertension Mother   . Sleep apnea Daughter     History  Substance Use Topics  . Smoking status: Current Some Day Smoker  . Smokeless tobacco: Never Used  . Alcohol Use: No     Comment: occasional    OB History   Grav Para Term Preterm Abortions TAB SAB Ect Mult Living   3 2 2       2       Review of Systems  Cardiovascular: Positive for chest pain.  All other systems reviewed and are negative.    Allergies  Review of patient's allergies indicates no known allergies.  Home Medications   Current Outpatient Rx  Name  Route  Sig  Dispense  Refill  . ondansetron (ZOFRAN) 4 MG tablet   Oral   Take 1 tablet (4 mg total) by mouth every 6 (six) hours.   12 tablet   0   . Pediatric Multivit-Minerals-C (FLINTSTONES GUMMIES PO)   Oral   Take 2 each by mouth daily.         Marland Kitchen triamcinolone (KENALOG) 0.1 % cream   Topical  Apply 1 application topically 2 (two) times daily as needed. For eczema           BP 125/69  Pulse 69  Temp(Src) 97.7 F (36.5 C)  Resp 18  Ht 5\' 7"  (1.702 m)  Wt 220 lb (99.791 kg)  BMI 34.45 kg/m2  SpO2 100%  LMP 08/26/2012  Physical Exam  Nursing note and vitals reviewed. Constitutional: She is oriented to person, place, and time. She appears well-developed and well-nourished. No distress.  HENT:  Head: Normocephalic and atraumatic.  Eyes: Conjunctivae are normal.  Neck: Normal range of motion.  Cardiovascular: Normal rate and regular rhythm.  Exam reveals no gallop and no friction rub.   No murmur heard. Pulmonary/Chest: Effort normal and breath sounds normal. She has no wheezes. She has no rales. She exhibits tenderness.  Mild right upper anterior chest tenderness to palpation.  Abdominal: Soft. There is no tenderness.  Musculoskeletal: Normal range of motion.  Neurological: She is alert and oriented to person, place, and time. Coordination normal.  Speech is goal-oriented. Moves limbs without ataxia.   Skin: Skin is warm and dry.  Psychiatric: She has a normal mood and affect. Her behavior is normal.    ED Course  Procedures (including critical care time)  Labs Reviewed  CBC WITH DIFFERENTIAL - Abnormal;  Notable for the following:    Hemoglobin 10.9 (*)    HCT 33.4 (*)    MCV 70.2 (*)    MCH 22.9 (*)    RDW 17.1 (*)    Eosinophils Relative 10 (*)    All other components within normal limits  BASIC METABOLIC PANEL - Abnormal; Notable for the following:    GFR calc non Af Amer 79 (*)    All other components within normal limits  D-DIMER, QUANTITATIVE   Dg Chest 2 View  09/09/2012  *RADIOLOGY REPORT*  Clinical Data: Anterior chest pain with inspiration.  Cough.  CHEST - 2 VIEW  Comparison: None.  Findings: Slightly shallow inspiration. The heart size and pulmonary vascularity are normal. The lungs appear clear and expanded without focal air space disease or  consolidation. No blunting of the costophrenic angles.  No pneumothorax.  Mediastinal contours appear intact.  IMPRESSION: No evidence of active pulmonary disease.   Original Report Authenticated By: Burman Nieves, M.D.      1. Chest wall pain       MDM  11:06 PM Labs unremarkable. D-dimer negative. Patient will be discharged with ibuprofen for pain. Vitals stable for discharge. No further evaluation needed at this time.         Emilia Beck, PA-C 09/09/12 2312

## 2012-09-12 NOTE — ED Provider Notes (Signed)
History/physical exam/procedure(s) were performed by non-physician practitioner and as supervising physician I was immediately available for consultation/collaboration. I have reviewed all notes and am in agreement with care and plan.   Hilario Quarry, MD 09/12/12 [phone]

## 2013-05-23 ENCOUNTER — Other Ambulatory Visit: Payer: Self-pay

## 2013-07-18 DIAGNOSIS — F17200 Nicotine dependence, unspecified, uncomplicated: Secondary | ICD-10-CM | POA: Insufficient documentation

## 2013-07-18 DIAGNOSIS — J45909 Unspecified asthma, uncomplicated: Secondary | ICD-10-CM | POA: Insufficient documentation

## 2013-07-18 DIAGNOSIS — J069 Acute upper respiratory infection, unspecified: Secondary | ICD-10-CM | POA: Insufficient documentation

## 2013-07-18 DIAGNOSIS — Z79899 Other long term (current) drug therapy: Secondary | ICD-10-CM | POA: Insufficient documentation

## 2013-07-18 DIAGNOSIS — Z3202 Encounter for pregnancy test, result negative: Secondary | ICD-10-CM | POA: Insufficient documentation

## 2013-07-18 DIAGNOSIS — IMO0001 Reserved for inherently not codable concepts without codable children: Secondary | ICD-10-CM | POA: Insufficient documentation

## 2013-07-19 ENCOUNTER — Encounter (HOSPITAL_BASED_OUTPATIENT_CLINIC_OR_DEPARTMENT_OTHER): Payer: Self-pay | Admitting: Emergency Medicine

## 2013-07-19 ENCOUNTER — Emergency Department (HOSPITAL_BASED_OUTPATIENT_CLINIC_OR_DEPARTMENT_OTHER)
Admission: EM | Admit: 2013-07-19 | Discharge: 2013-07-19 | Disposition: A | Discharge status: Home / Self Care | Payer: Self-pay | Attending: Emergency Medicine | Admitting: Emergency Medicine

## 2013-07-19 DIAGNOSIS — J069 Acute upper respiratory infection, unspecified: Secondary | ICD-10-CM

## 2013-07-19 LAB — PREGNANCY, URINE: Preg Test, Ur: NEGATIVE

## 2013-07-19 MED ORDER — IBUPROFEN 800 MG PO TABS
ORAL_TABLET | ORAL | Status: AC
  Administered 2013-07-19: 800 mg
  Filled 2013-07-19: qty 1

## 2013-07-19 MED ORDER — IBUPROFEN 800 MG PO TABS: 800.0000 mg | ORAL_TABLET | Freq: Three times a day (TID) | ORAL | Status: DC

## 2013-07-19 MED ORDER — LIDOCAINE VISCOUS 2 % MT SOLN
15.0000 mL | Freq: Once | OROMUCOSAL | Status: AC
  Administered 2013-07-19: 15 mL via OROMUCOSAL

## 2013-07-19 MED ORDER — LIDOCAINE VISCOUS 2 % MT SOLN
OROMUCOSAL | Status: AC
  Filled 2013-07-19: qty 15

## 2013-07-19 MED ORDER — IBUPROFEN 800 MG PO TABS: 800.0000 mg | ORAL_TABLET | Freq: Once | ORAL | Status: DC

## 2013-07-19 NOTE — ED Notes (Signed)
C/o chest pressure, cough non productive, congestion x few days

## 2013-07-19 NOTE — Discharge Instructions (Signed)
Cool Mist Vaporizers °Vaporizers may help relieve the symptoms of a cough and cold. They add moisture to the air, which helps mucus to become thinner and less sticky. This makes it easier to breathe and cough up secretions. Cool mist vaporizers do not cause serious burns like hot mist vaporizers ("steamers, humidifiers"). Vaporizers have not been proved to show they help with colds. You should not use a vaporizer if you are allergic to mold.  °HOME CARE INSTRUCTIONS °· Follow the package instructions for the vaporizer. °· Do not use anything other than distilled water in the vaporizer. °· Do not run the vaporizer all of the time. This can cause mold or bacteria to grow in the vaporizer. °· Clean the vaporizer after each time it is used. °· Clean and dry the vaporizer well before storing it. °· Stop using the vaporizer if worsening respiratory symptoms develop. °Document Released: 03/31/2004 Document Revised: 03/06/2013 Document Reviewed: 11/21/2012 °ExitCare® Patient Information ©2014 [employer]. ° °

## 2013-07-19 NOTE — ED Notes (Signed)
C/o chest pressure, cough, and diff swallowing for few days.

## 2013-07-19 NOTE — ED Provider Notes (Addendum)
CSN: [id number]     Arrival date & time 07/18/13  2350 History   First MD Initiated Contact with Patient 07/19/13 0143     Chief Complaint  Patient presents with  . Chest Pain   (Consider location/radiation/quality/duration/timing/severity/associated sxs/prior Treatment) Patient is a 23 y.o. female presenting with URI. The history is provided by the patient. No language interpreter was used.  URI Presenting symptoms: congestion, rhinorrhea and sore throat   Presenting symptoms: no cough, no ear pain and no fever   Congestion:    Location:  Nasal   Interferes with sleep: no     Interferes with eating/drinking: no   Rhinorrhea:    Quality:  Clear   Severity:  Mild   Timing:  Constant   Progression:  Unchanged Sore throat:    Severity:  Mild   Onset quality:  Gradual   Timing:  Constant   Progression:  Unchanged Severity:  Mild Onset quality:  Gradual Timing:  Constant Progression:  Unchanged Chronicity:  New Relieved by:  Nothing Worsened by:  Nothing tried Ineffective treatments:  None tried Associated symptoms: myalgias and sneezing   Associated symptoms: no arthralgias, no neck pain, no swollen glands and no wheezing   Risk factors: not elderly and no recent travel   feels drainage going down throat and getting stuck and making throat feel scratchy.    Past Medical History  Diagnosis Date  . Asthma     childhood   Past Surgical History  Procedure Laterality Date  . Nasal sinus surgery     Family History  Problem Relation Age of Onset  . Hypertension Mother   . Sleep apnea Daughter    History  Substance Use Topics  . Smoking status: Current Some Day Smoker  . Smokeless tobacco: Never Used  . Alcohol Use: No     Comment: occasional   OB History   Grav Para Term Preterm Abortions TAB SAB Ect Mult Living   3 2 2       2      Review of Systems  Constitutional: Negative for fever.  HENT: Positive for congestion, postnasal drip, rhinorrhea, sneezing and sore  throat. Negative for drooling and ear pain.   Respiratory: Negative for cough, chest tightness and wheezing.   Cardiovascular: Negative for chest pain, palpitations and leg swelling.  Musculoskeletal: Positive for myalgias. Negative for arthralgias and neck pain.  All other systems reviewed and are negative.    Allergies  Review of patient's allergies indicates no known allergies.  Home Medications   Current Outpatient Rx  Name  Route  Sig  Dispense  Refill  . levonorgestrel (MIRENA) 20 MCG/24HR IUD   Intrauterine   1 each by Intrauterine route once.         Marland Kitchen. ibuprofen (ADVIL,MOTRIN) 800 MG tablet   Oral   Take 1 tablet (800 mg total) by mouth 3 (three) times daily.   21 tablet   0   . ondansetron (ZOFRAN) 4 MG tablet   Oral   Take 1 tablet (4 mg total) by mouth every 6 (six) hours.   12 tablet   0   . Pediatric Multivit-Minerals-C (FLINTSTONES GUMMIES PO)   Oral   Take 2 each by mouth daily.         Marland Kitchen. triamcinolone (KENALOG) 0.1 % cream   Topical   Apply 1 application topically 2 (two) times daily as needed. For eczema          BP 135/76  Pulse 20  Temp(Src) 98.6 F (37 C) (Oral)  Resp 20  Ht 5\' 7"  (1.702 m)  Wt 230 lb (104.327 kg)  BMI 36.01 kg/m2  SpO2 100%  LMP 06/05/2013 Physical Exam  Constitutional: She is oriented to person, place, and time. She appears well-developed and well-nourished. No distress.  HENT:  Head: Normocephalic and atraumatic.  Mouth/Throat: Oropharynx is clear and moist.  Eyes: Conjunctivae are normal. Pupils are equal, round, and reactive to light.  Neck: Normal range of motion. Neck supple.  Cardiovascular: Normal rate, regular rhythm and intact distal pulses.   Pulmonary/Chest: Effort normal. No respiratory distress. She has no wheezes. She has no rales.  Abdominal: Soft. Bowel sounds are normal. There is no tenderness. There is no rebound and no guarding.  Musculoskeletal: Normal range of motion. She exhibits no edema  and no tenderness.  Neurological: She is alert and oriented to person, place, and time.  Skin: Skin is warm and dry.  Psychiatric: She has a normal mood and affect.    ED Course  Procedures (including critical care time) Labs Review Labs Reviewed - No data to display Imaging Review No results found.  EKG Interpretation   None       MDM  No diagnosis found. Wells 0-- dobt PE.  Symptoms all above the leave of the chest.   Denies chest pain. Appreciate note but this is URI symptoms.   She has a scratchy throat.    Symptoms completely resolved in the ED.  Clearly all URI.  Will treat with mucinex and ibuprofen and close follow up    Taylinn Brabant K Evagelia Knack-Rasch, MD 07/19/13 [id number]  Jasmine Awe, MD 07/19/13 [phone]

## 2013-11-08 ENCOUNTER — Emergency Department (HOSPITAL_BASED_OUTPATIENT_CLINIC_OR_DEPARTMENT_OTHER)
Admission: EM | Admit: 2013-11-08 | Discharge: 2013-11-08 | Disposition: A | Discharge status: Home / Self Care | Payer: Self-pay | Attending: Emergency Medicine | Admitting: Emergency Medicine

## 2013-11-08 ENCOUNTER — Encounter (HOSPITAL_BASED_OUTPATIENT_CLINIC_OR_DEPARTMENT_OTHER): Payer: Self-pay | Admitting: Emergency Medicine

## 2013-11-08 DIAGNOSIS — F17200 Nicotine dependence, unspecified, uncomplicated: Secondary | ICD-10-CM | POA: Insufficient documentation

## 2013-11-08 DIAGNOSIS — L03019 Cellulitis of unspecified finger: Secondary | ICD-10-CM | POA: Insufficient documentation

## 2013-11-08 DIAGNOSIS — J45909 Unspecified asthma, uncomplicated: Secondary | ICD-10-CM | POA: Insufficient documentation

## 2013-11-08 DIAGNOSIS — Z791 Long term (current) use of non-steroidal anti-inflammatories (NSAID): Secondary | ICD-10-CM | POA: Insufficient documentation

## 2013-11-08 DIAGNOSIS — IMO0002 Reserved for concepts with insufficient information to code with codable children: Secondary | ICD-10-CM

## 2013-11-08 MED ORDER — CEPHALEXIN 250 MG PO CAPS
500.0000 mg | ORAL_CAPSULE | Freq: Once | ORAL | Status: AC
  Administered 2013-11-08: 500 mg via ORAL
  Filled 2013-11-08: qty 2

## 2013-11-08 MED ORDER — CEPHALEXIN 500 MG PO CAPS: 500.0000 mg | ORAL_CAPSULE | Freq: Four times a day (QID) | ORAL | Status: DC

## 2013-11-08 NOTE — ED Provider Notes (Signed)
CSN: [id number]     Arrival date & time 11/08/13  2013 History  This chart was scribed for Rachel B. Bernette MayersSheldon, MD by Elveria Risingimelie Horne, ED scribe.  This patient was seen in room MH05/MH05 and the patient's care was started at 8:50 PM.   Chief Complaint  Patient presents with  . Finger Injury      The history is provided by the patient. No language interpreter was used.   HPI Comments: Rachel Clay is a 23 y.o. female who presents to the Emergency Department with progressively worsening right third finger pain and swelling, onset one week ago. Patient reports that the finger pan began last Friday, but the finger did not begin swelling until Monday, four days ago. Patient denies causative trauma or injury and recent visits to nail salons.   Past Medical History  Diagnosis Date  . Asthma     childhood   Past Surgical History  Procedure Laterality Date  . Nasal sinus surgery     Family History  Problem Relation Age of Onset  . Hypertension Mother   . Sleep apnea Daughter    History  Substance Use Topics  . Smoking status: Current Some Day Smoker  . Smokeless tobacco: Never Used  . Alcohol Use: No     Comment: occasional   OB History   Grav Para Term Preterm Abortions TAB SAB Ect Mult Living   3 2 2       2      Review of Systems A complete 10 system review of systems was obtained and all systems are negative except as noted in the HPI and PMH.     Allergies  Review of patient's allergies indicates no known allergies.  Home Medications   Prior to Admission medications   Medication Sig Start Date End Date Taking? Authorizing Provider  ibuprofen (ADVIL,MOTRIN) 800 MG tablet Take 1 tablet (800 mg total) by mouth 3 (three) times daily. 09/09/12   Kaitlyn Szekalski, PA-C  ibuprofen (ADVIL,MOTRIN) 800 MG tablet Take 1 tablet (800 mg total) by mouth 3 (three) times daily. 07/19/13   April Smitty CordsK Palumbo-Rasch, MD  levonorgestrel (MIRENA) 20 MCG/24HR IUD 1 each by Intrauterine route once.     Historical Provider, MD  ondansetron (ZOFRAN) 4 MG tablet Take 1 tablet (4 mg total) by mouth every 6 (six) hours. 08/08/12   Glynn OctaveStephen Rancour, MD  Pediatric Multivit-Minerals-C (FLINTSTONES GUMMIES PO) Take 2 each by mouth daily.    Historical Provider, MD  triamcinolone (KENALOG) 0.1 % cream Apply 1 application topically 2 (two) times daily as needed. For eczema    Historical Provider, MD   Triage Vitals: BP 124/87  Temp(Src) 98.6 F (37 C) (Oral)  Resp 18  Ht 5\' 7"  (1.702 m)  Wt 240 lb (108.863 kg)  BMI 37.58 kg/m2  SpO2 100% Physical Exam  Nursing note and vitals reviewed. Constitutional: She is oriented to person, place, and time. She appears well-developed and well-nourished.  HENT:  Head: Normocephalic and atraumatic.  Eyes: EOM are normal. Pupils are equal, round, and reactive to light.  Neck: Normal range of motion. Neck supple.  Cardiovascular: Normal rate, normal heart sounds and intact distal pulses.   Pulmonary/Chest: Effort normal and breath sounds normal.  Abdominal: Bowel sounds are normal. She exhibits no distension. There is no tenderness.  Musculoskeletal: Normal range of motion. She exhibits edema and tenderness.  Paronychia R middle finger  Neurological: She is alert and oriented to person, place, and time. She has normal strength. No  cranial nerve deficit or sensory deficit.  Skin: Skin is warm and dry. No rash noted.  Psychiatric: She has a normal mood and affect.    ED Course  Procedures (including critical care time)  INCISION AND DRAINAGE Performed by: Bonnita Levanharles B. Bernette Clay Consent: Verbal consent obtained. Risks and benefits: risks, benefits and alternatives were discussed Time out performed prior to procedure Type: abscess Body area: finger Anesthesia: digital block Incision was made with a scalpel. Local anesthetic: lidocaine 2% no epinephrine Anesthetic total: 3 ml Complexity: complex Blunt dissection to break up loculations Drainage:  purulent Drainage amount: moderate Packing material: none Patient tolerance: Patient tolerated the procedure well with no immediate complications.     DIAGNOSTIC STUDIES: Oxygen Saturation is 100% on room air, normal by my interpretation.    COORDINATION OF CARE: 8:51 PM- Discussed treatment plan with patient at bedside and patient agreed to plan.     Labs Review Labs Reviewed - No data to display  Imaging Review No results found.   EKG Interpretation None      MDM   Final diagnoses:  Paronychia     I personally performed the services described in this documentation, which was scribed in my presence. The recorded information has been reviewed and is accurate.      Rachel B. Bernette MayersSheldon, MD 11/08/13 2223

## 2013-11-08 NOTE — ED Notes (Signed)
Pt presents w/ c/o rt 3rd digit swelling, redness and pain at nailbed x3 days. Denies fever.

## 2013-11-08 NOTE — ED Notes (Signed)
Pt ambulating independently w/ steady gait on d/c in no acute distress, A&Ox4. D/c instructions reviewed w/ pt and family - pt and family deny any further questions or concerns at present. Rx given x1  

## 2013-11-08 NOTE — Discharge Instructions (Signed)
Paronychia Paronychia is an inflammatory reaction involving the folds of the skin surrounding the fingernail. This is commonly caused by an infection in the skin around a nail. The most common cause of paronychia is frequent wetting of the hands (as seen with bartenders, food servers, nurses or others who wet their hands). This makes the skin around the fingernail susceptible to infection by bacteria (germs) or fungus. Other predisposing factors are:  Aggressive manicuring.  Nail biting.  Thumb sucking. The most common cause is a staphylococcal (a type of germ) infection, or a fungal (Candida) infection. When caused by a germ, it usually comes on suddenly with redness, swelling, pus and is often painful. It may get under the nail and form an abscess (collection of pus), or form an abscess around the nail. If the nail itself is infected with a fungus, the treatment is usually prolonged and may require oral medicine for up to one year. Your caregiver will determine the length of time treatment is required. The paronychia caused by bacteria (germs) may largely be avoided by not pulling on hangnails or picking at cuticles. When the infection occurs at the tips of the finger it is called felon. When the cause of paronychia is from the herpes simplex virus (HSV) it is called herpetic whitlow. TREATMENT  When an abscess is present treatment is often incision and drainage. This means that the abscess must be cut open so the pus can get out. When this is done, the following home care instructions should be followed. HOME CARE INSTRUCTIONS   It is important to keep the affected fingers very dry. Rubber or plastic gloves over cotton gloves should be used whenever the hand must be placed in water.  Keep wound clean, dry and dressed as suggested by your caregiver between warm soaks or warm compresses.  Soak in warm water for fifteen to twenty minutes three to four times per day for bacterial infections. Fungal  infections are very difficult to treat, so often require treatment for long periods of time.  For bacterial (germ) infections take antibiotics (medicine which kill germs) as directed and finish the prescription, even if the problem appears to be solved before the medicine is gone.  Only take over-the-counter or prescription medicines for pain, discomfort, or fever as directed by your caregiver. SEEK IMMEDIATE MEDICAL CARE IF:  You have redness, swelling, or increasing pain in the wound.  You notice pus coming from the wound.  You have a fever.  You notice a bad smell coming from the wound or dressing. Document Released: 12/28/2000 Document Revised: 09/26/2011 Document Reviewed: 08/29/2008 Exit[employer] Patient Information 2014 [employer].  

## 2013-11-08 NOTE — ED Notes (Signed)
Swelling and pain to her right middle finger. Infection to her nailbed.

## 2013-11-14 IMAGING — US US OB COMP +14 WK
1 series · 12 of 28 positions shown · non-contrast
Comparison: none

[Series 1: us ob comp +14 wk · 12 of 73 slices shown]
[im 3/73]
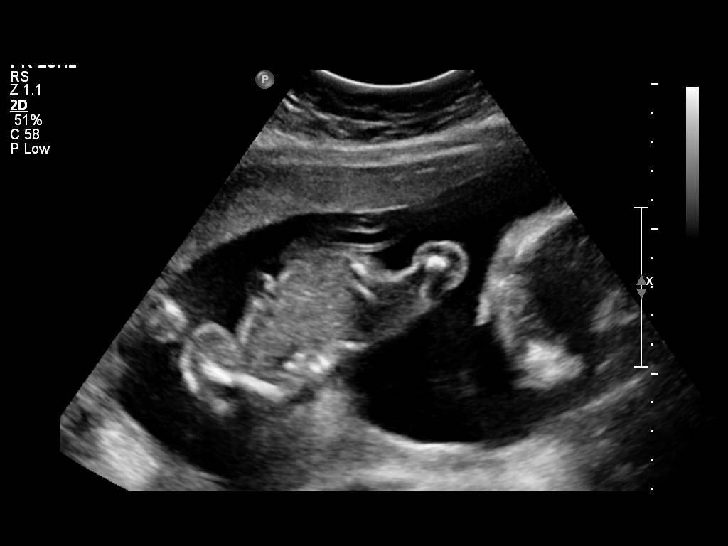
[im 9/73]
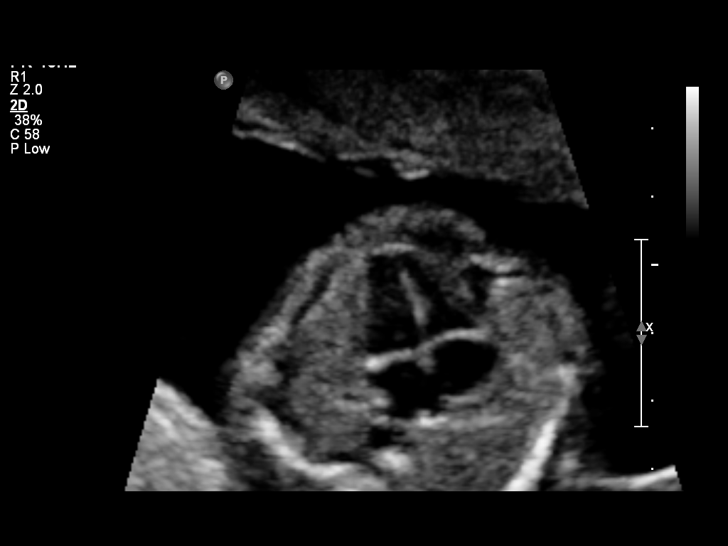
[im 14/73]
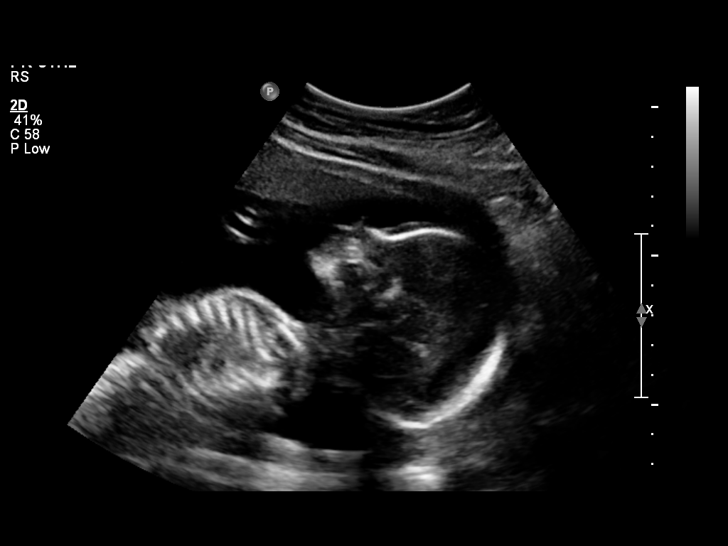
[im 22/73]
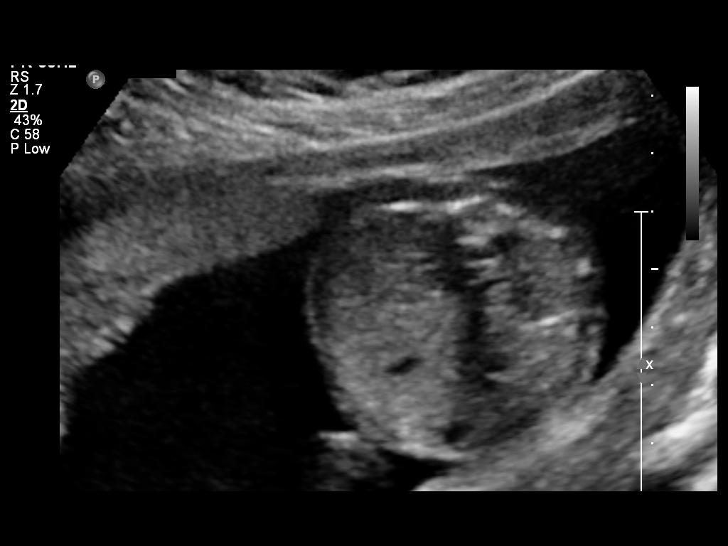
[im 27/73]
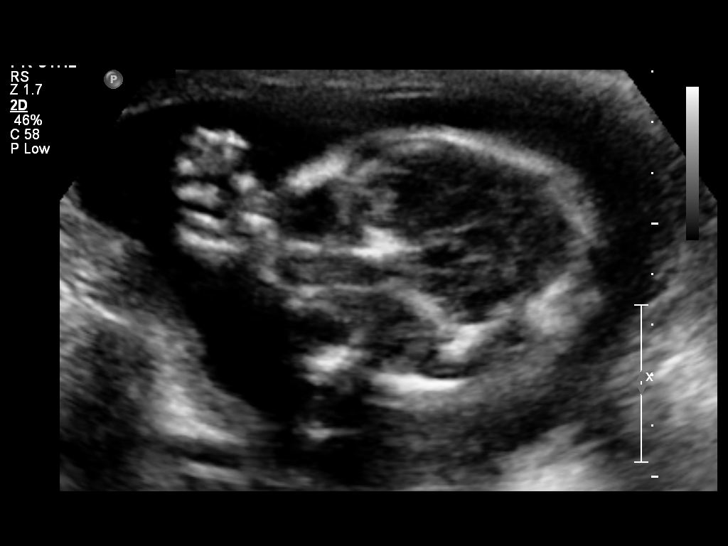
[im 33/73]
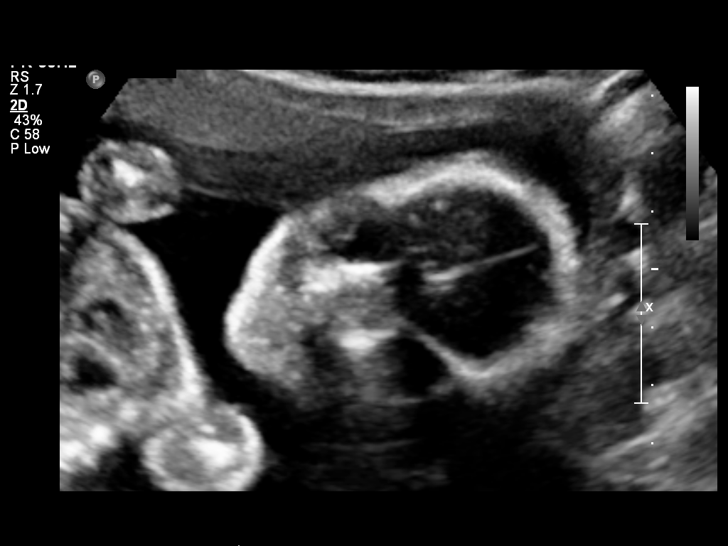
[im 41/73]
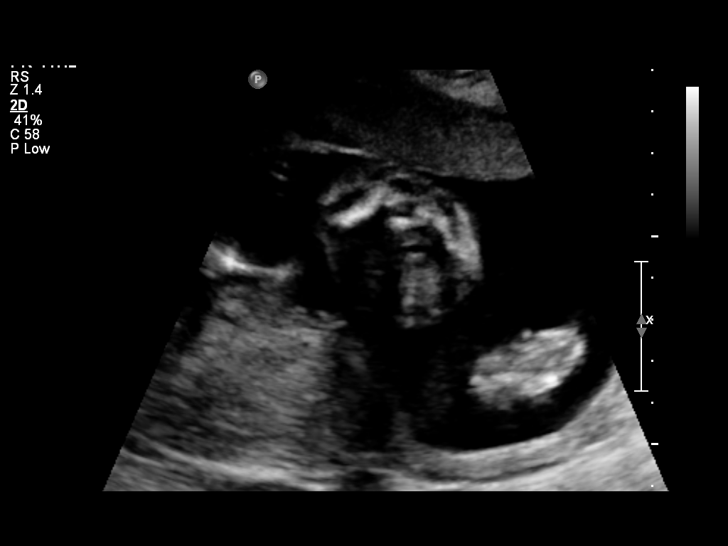
[im 46/73]
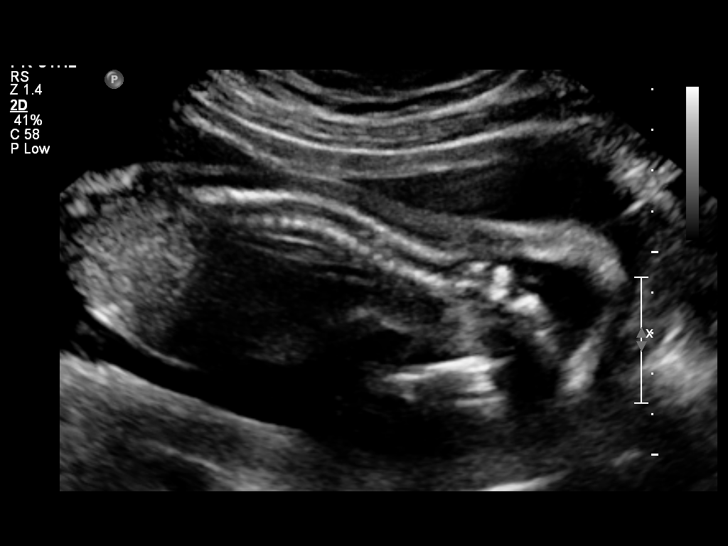
[im 51/73]
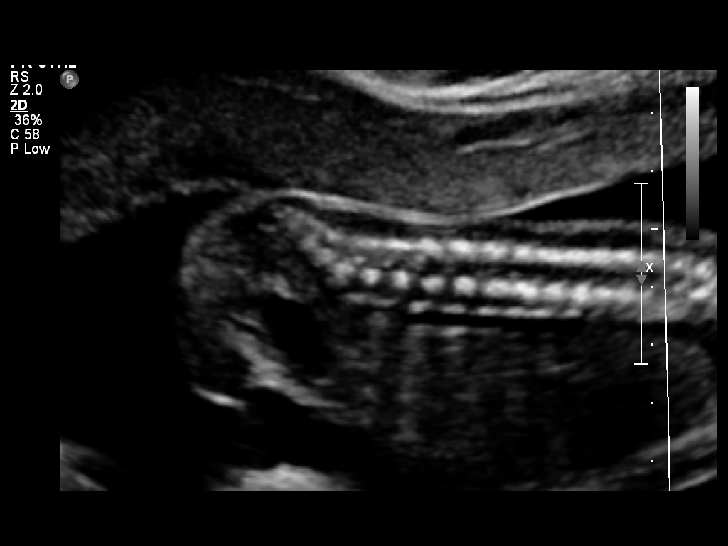
[im 59/73]
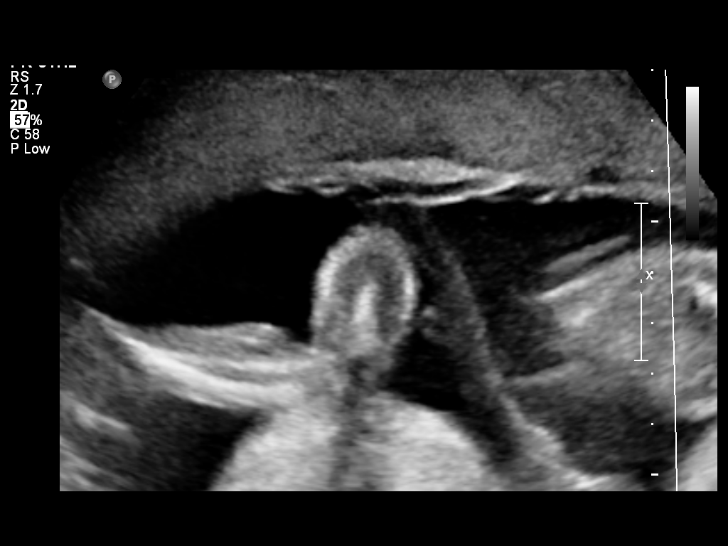
[im 65/73]
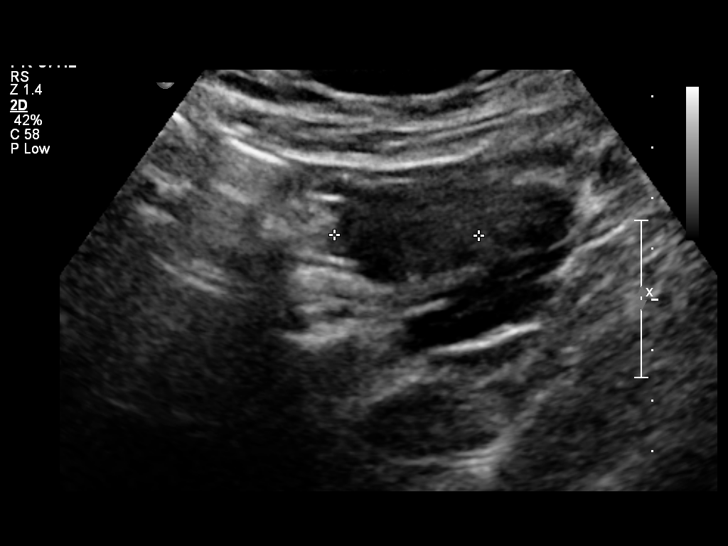
[im 70/73]
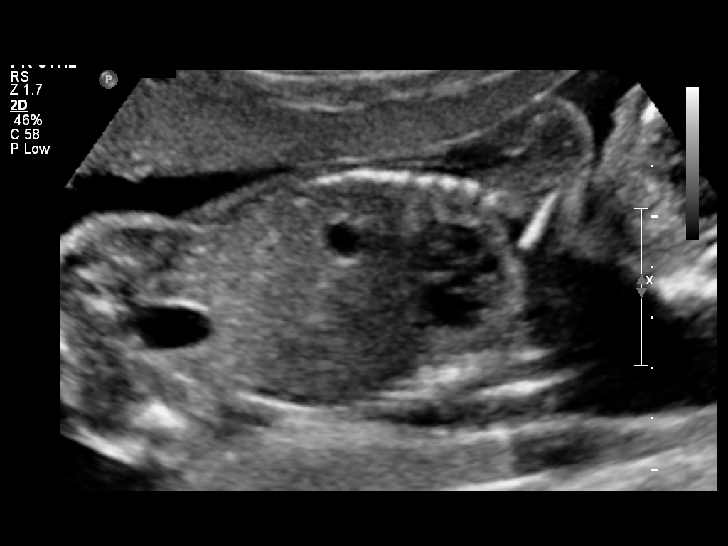

[12 of 28 positions shown; findings below may reference images not displayed]

OBSTETRICS REPORT
                      (Signed Final 03/12/2012 [DATE])

Procedures

 US OB COMP + 14 WK                                    76805.1
Indications

 Unsure of LMP;  Establish Gestational [AGE]
Fetal Evaluation

 Fetal Heart Rate:  144                         bpm
 Cardiac Activity:  Observed
 Presentation:      Cephalic
 Placenta:          Anterior, above cervical os
 P. Cord            Appears WNL
 Insertion:

 Amniotic Fluid
 AFI FV:      Subjectively within normal limits
                                             Larg Pckt:     5.9  cm
Biometry

 BPD:     51.7  mm    G. Age:   21w 5d                CI:        70.26   70 - 86
                                                      FL/HC:      18.7   18.4 -

 HC:     196.7  mm    G. Age:   21w 6d       46  %    HC/AC:      1.17   1.06 -

 AC:     168.8  mm    G. Age:   21w 6d       48  %    FL/BPD:     71.0   71 - 87
 FL:      36.7  mm    G. Age:   21w 5d       39  %    FL/AC:      21.7   20 - 24
 HUM:     34.1  mm    G. Age:   21w 4d       46  %

 Est. FW:     451  gm          1 lb      47  %
Gestational Age

 LMP:           21w 5d       Date:   10/12/11                 EDD:   07/18/12
 U/S Today:     21w 5d                                        EDD:   07/18/12
 Best:          21w 5d    Det. By:   U/S (03/12/12)           EDD:   07/18/12
Anatomy

 Cranium:           Appears normal      Aortic Arch:       Basic anatomy
                                                           exam per order
 Fetal Cavum:       Appears normal      Ductal Arch:       Basic anatomy
                                                           exam per order
 Ventricles:        Appears normal      Diaphragm:         Appears normal
 Choroid Plexus:    Appears normal      Stomach:           Appears
                                                           normal, left
                                                           sided
 Cerebellum:        Appears normal      Abdomen:           Appears normal
 Posterior Fossa:   Appears normal      Abdominal Wall:    Appears nml
                                                           (cord insert,
                                                           abd wall)
 Nuchal Fold:       Not applicable      Cord Vessels:      Appears normal
                    (>20 wks GA)                           (3 vessel cord)
 Face:              Appears normal      Kidneys:           Appear normal
                    (lips/profile/orbit
                    s)
 Heart:             Appears normal      Bladder:           Appears normal
                    (4 chamber &
                    axis)
 RVOT:              Appears normal      Spine:             Appears normal
 LVOT:              Appears normal      Limbs:             Four extremities
                                                           visualized
                                                           (basic anatomy
                                                           exam)

 Other:     Male gender. Nasal bone visualized.
Cervix Uterus Adnexa

 Cervical Length:   3.2       cm

 Cervix:       Normal appearance by transabdominal scan.

 Left Ovary:   Within normal limits.
 Right Ovary:  Within normal limits.
 Adnexa:     No abnormality visualized.
Impression

 Single living IUP with estimated gestational age by ultrasound
 of 21w 5d, and EDD of 07/18/2012.
 No fetal anatomic abnormality is identified.
 Normal amniotic fluid volume and cervical length.

 questions or concerns.

## 2014-05-19 ENCOUNTER — Encounter (HOSPITAL_BASED_OUTPATIENT_CLINIC_OR_DEPARTMENT_OTHER): Payer: Self-pay | Admitting: Emergency Medicine

## 2015-12-28 ENCOUNTER — Encounter (HOSPITAL_BASED_OUTPATIENT_CLINIC_OR_DEPARTMENT_OTHER): Payer: Self-pay | Admitting: *Deleted

## 2015-12-28 ENCOUNTER — Emergency Department (HOSPITAL_BASED_OUTPATIENT_CLINIC_OR_DEPARTMENT_OTHER)
Admission: EM | Admit: 2015-12-28 | Discharge: 2015-12-28 | Disposition: A | Discharge status: Home / Self Care | Payer: Medicaid Other | Attending: Emergency Medicine | Admitting: Emergency Medicine

## 2015-12-28 ENCOUNTER — Emergency Department (HOSPITAL_BASED_OUTPATIENT_CLINIC_OR_DEPARTMENT_OTHER): Payer: Medicaid Other

## 2015-12-28 DIAGNOSIS — Z87891 Personal history of nicotine dependence: Secondary | ICD-10-CM | POA: Insufficient documentation

## 2015-12-28 DIAGNOSIS — R110 Nausea: Secondary | ICD-10-CM | POA: Diagnosis not present

## 2015-12-28 DIAGNOSIS — Z3A01 Less than 8 weeks gestation of pregnancy: Secondary | ICD-10-CM | POA: Insufficient documentation

## 2015-12-28 DIAGNOSIS — J45909 Unspecified asthma, uncomplicated: Secondary | ICD-10-CM | POA: Diagnosis not present

## 2015-12-28 DIAGNOSIS — O26899 Other specified pregnancy related conditions, unspecified trimester: Secondary | ICD-10-CM

## 2015-12-28 DIAGNOSIS — O26891 Other specified pregnancy related conditions, first trimester: Secondary | ICD-10-CM | POA: Diagnosis not present

## 2015-12-28 DIAGNOSIS — R1031 Right lower quadrant pain: Secondary | ICD-10-CM | POA: Diagnosis not present

## 2015-12-28 DIAGNOSIS — N898 Other specified noninflammatory disorders of vagina: Secondary | ICD-10-CM | POA: Insufficient documentation

## 2015-12-28 DIAGNOSIS — R109 Unspecified abdominal pain: Secondary | ICD-10-CM

## 2015-12-28 LAB — CBC WITH DIFFERENTIAL/PLATELET
BASOS PCT: 0 %
Basophils Absolute: 0 10*3/uL (ref 0.0–0.1)
EOS ABS: 0.5 10*3/uL (ref 0.0–0.7)
Eosinophils Relative: 7 %
HEMATOCRIT: 31.9 % — AB (ref 36.0–46.0)
HEMOGLOBIN: 10.7 g/dL — AB (ref 12.0–15.0)
LYMPHS PCT: 25 %
Lymphs Abs: 1.7 10*3/uL (ref 0.7–4.0)
MCH: 24.1 pg — AB (ref 26.0–34.0)
MCHC: 33.5 g/dL (ref 30.0–36.0)
MCV: 71.8 fL — ABNORMAL LOW (ref 78.0–100.0)
MONOS PCT: 8 %
Monocytes Absolute: 0.5 10*3/uL (ref 0.1–1.0)
NEUTROS ABS: 4 10*3/uL (ref 1.7–7.7)
Neutrophils Relative %: 60 %
Platelets: 317 10*3/uL (ref 150–400)
RBC: 4.44 MIL/uL (ref 3.87–5.11)
RDW: 13.1 % (ref 11.5–15.5)
WBC: 6.7 10*3/uL (ref 4.0–10.5)

## 2015-12-28 LAB — URINALYSIS, ROUTINE W REFLEX MICROSCOPIC
BILIRUBIN URINE: NEGATIVE
Bilirubin Urine: NEGATIVE
GLUCOSE, UA: NEGATIVE mg/dL
Glucose, UA: NEGATIVE mg/dL
HGB URINE DIPSTICK: NEGATIVE
Hgb urine dipstick: NEGATIVE
KETONES UR: NEGATIVE mg/dL
Ketones, ur: NEGATIVE mg/dL
NITRITE: NEGATIVE
Nitrite: NEGATIVE
PH: 7.5 (ref 5.0–8.0)
PROTEIN: NEGATIVE mg/dL
Protein, ur: NEGATIVE mg/dL
SPECIFIC GRAVITY, URINE: 1.022 (ref 1.005–1.030)
Specific Gravity, Urine: 1.02 (ref 1.005–1.030)
pH: 7 (ref 5.0–8.0)

## 2015-12-28 LAB — BASIC METABOLIC PANEL
Anion gap: 8 (ref 5–15)
BUN: 10 mg/dL (ref 6–20)
CHLORIDE: 104 mmol/L (ref 101–111)
CO2: 22 mmol/L (ref 22–32)
CREATININE: 0.58 mg/dL (ref 0.44–1.00)
Calcium: 9 mg/dL (ref 8.9–10.3)
GFR calc Af Amer: >60 mL/min (ref >60)
GFR calc non Af Amer: >60 mL/min (ref >60)
Glucose, Bld: 115 mg/dL — ABNORMAL HIGH (ref 65–99)
Potassium: 3.7 mmol/L (ref 3.5–5.1)
Sodium: 134 mmol/L — ABNORMAL LOW (ref 135–145)

## 2015-12-28 LAB — WET PREP, GENITAL
SPERM: NONE SEEN
Trich, Wet Prep: NONE SEEN
Yeast Wet Prep HPF POC: NONE SEEN

## 2015-12-28 LAB — URINE MICROSCOPIC-ADD ON

## 2015-12-28 LAB — PREGNANCY, URINE: PREG TEST UR: POSITIVE — AB

## 2015-12-28 LAB — HCG, QUANTITATIVE, PREGNANCY: HCG, BETA CHAIN, QUANT, S: 94312 m[IU]/mL — AB (ref <5)

## 2015-12-28 MED ORDER — SODIUM CHLORIDE 0.9 % IV BOLUS (SEPSIS)
500.0000 mL | Freq: Once | INTRAVENOUS | Status: AC
  Administered 2015-12-28: 500 mL via INTRAVENOUS

## 2015-12-28 NOTE — ED Notes (Signed)
MD at bedside. 

## 2015-12-28 NOTE — ED Notes (Signed)
Patient transported to Ultrasound 

## 2015-12-28 NOTE — ED Notes (Signed)
abd pain x3 weeks.

## 2015-12-28 NOTE — Discharge Instructions (Signed)
Your due date is approximately July 30 2016. You are about 9 weeks and 2 days pregnant today

## 2015-12-28 NOTE — ED Provider Notes (Signed)
CSN: [id number]     Arrival date & time 12/28/15  7829 History   First MD Initiated Contact with Patient 12/28/15 [phone]     Chief Complaint  Patient presents with  . Abdominal Pain     (Consider location/radiation/quality/duration/timing/severity/associated sxs/prior Treatment) HPI  25 year old female presents with lower abdominal pain for 3 weeks. Pain is intermittent and feels like cramping sensation. Is both left and right sided although the right is low but worse. Denies any vomiting, diarrhea, or abnormal vaginal bleeding. Last had her menstrual cycle about one month ago. Has irregular menstrual cycles. Has been having intermittent nausea. No back pain. No dysuria or hematuria. No vaginal discharge. Has been taking ibuprofen with some relief. Pain is currently a 6.5/10. Is not particularly worse today but wanted to get it checked out.  Past Medical History  Diagnosis Date  . Asthma     childhood   Past Surgical History  Procedure Laterality Date  . Nasal sinus surgery     Family History  Problem Relation Age of Onset  . Hypertension Mother   . Sleep apnea Daughter    Social History  Substance Use Topics  . Smoking status: Former Games developer  . Smokeless tobacco: Never Used  . Alcohol Use: No     Comment: occasional   OB History    Gravida Para Term Preterm AB TAB SAB Ectopic Multiple Living   3 2 2       2      Review of Systems  Constitutional: Negative for fever.  Gastrointestinal: Positive for nausea and abdominal pain. Negative for vomiting, diarrhea and constipation.  Genitourinary: Negative for dysuria, hematuria, vaginal bleeding and vaginal discharge.  All other systems reviewed and are negative.     Allergies  Review of patient's allergies indicates no known allergies.  Home Medications   Prior to Admission medications   Medication Sig Start Date End Date Taking? Authorizing Provider  cephALEXin (KEFLEX) 500 MG capsule Take 1 capsule (500 mg total) by  mouth 4 (four) times daily. 11/08/13   Susy Frizzle, MD  ibuprofen (ADVIL,MOTRIN) 800 MG tablet Take 1 tablet (800 mg total) by mouth 3 (three) times daily. 09/09/12   Kaitlyn Szekalski, PA-C  ibuprofen (ADVIL,MOTRIN) 800 MG tablet Take 1 tablet (800 mg total) by mouth 3 (three) times daily. 07/19/13   April Palumbo, MD  levonorgestrel (MIRENA) 20 MCG/24HR IUD 1 each by Intrauterine route once.    Historical Provider, MD  ondansetron (ZOFRAN) 4 MG tablet Take 1 tablet (4 mg total) by mouth every 6 (six) hours. 08/08/12   Glynn Octave, MD  Pediatric Multivit-Minerals-C (FLINTSTONES GUMMIES PO) Take 2 each by mouth daily.    Historical Provider, MD  triamcinolone (KENALOG) 0.1 % cream Apply 1 application topically 2 (two) times daily as needed. For eczema    Historical Provider, MD   BP 143/81 mmHg  Pulse 76  Temp(Src) 98.7 F (37.1 C) (Oral)  Resp 18  Ht 5\' 7"  (1.702 m)  Wt 215 lb (97.523 kg)  BMI 33.67 kg/m2  SpO2 100%  LMP 11/23/2015 (Approximate) Physical Exam  Constitutional: She is oriented to person, place, and time. She appears well-developed and well-nourished.  HENT:  Head: Normocephalic and atraumatic.  Right Ear: External ear normal.  Left Ear: External ear normal.  Nose: Nose normal.  Eyes: Right eye exhibits no discharge. Left eye exhibits no discharge.  Cardiovascular: Normal rate, regular rhythm and normal heart sounds.   Pulmonary/Chest: Effort normal and breath sounds normal.  Abdominal: Soft. There is tenderness in the right lower quadrant, suprapubic area and left lower quadrant.  Genitourinary: Cervix exhibits no motion tenderness. Right adnexum displays no mass. Left adnexum displays no mass. Vaginal discharge (mild, white) found.  Neurological: She is alert and oriented to person, place, and time.  Skin: Skin is warm and dry.  Nursing note and vitals reviewed.   ED Course  Procedures (including critical care time) Labs Review Labs Reviewed  WET PREP,  GENITAL - Abnormal; Notable for the following:    Clue Cells Wet Prep HPF POC PRESENT (*)    WBC, Wet Prep HPF POC MANY (*)    All other components within normal limits  URINALYSIS, ROUTINE W REFLEX MICROSCOPIC (NOT AT [facility]) - Abnormal; Notable for the following:    APPearance CLOUDY (*)    Leukocytes, UA SMALL (*)    All other components within normal limits  PREGNANCY, URINE - Abnormal; Notable for the following:    Preg Test, Ur POSITIVE (*)    All other components within normal limits  CBC WITH DIFFERENTIAL/PLATELET - Abnormal; Notable for the following:    Hemoglobin 10.7 (*)    HCT 31.9 (*)    MCV 71.8 (*)    MCH 24.1 (*)    All other components within normal limits  BASIC METABOLIC PANEL - Abnormal; Notable for the following:    Sodium 134 (*)    Glucose, Bld 115 (*)    All other components within normal limits  HCG, QUANTITATIVE, PREGNANCY - Abnormal; Notable for the following:    hCG, Beta Chain, Quant, S [id number] (*)    All other components within normal limits  URINE MICROSCOPIC-ADD ON - Abnormal; Notable for the following:    Squamous Epithelial / LPF 6-30 (*)    Bacteria, UA MANY (*)    All other components within normal limits  URINALYSIS, ROUTINE W REFLEX MICROSCOPIC (NOT AT [facility]) - Abnormal; Notable for the following:    APPearance CLOUDY (*)    Leukocytes, UA TRACE (*)    All other components within normal limits  URINE MICROSCOPIC-ADD ON - Abnormal; Notable for the following:    Squamous Epithelial / LPF 0-5 (*)    Bacteria, UA FEW (*)    All other components within normal limits  URINE CULTURE  RPR  HIV ANTIBODY (ROUTINE TESTING)  GC/CHLAMYDIA PROBE AMP ([place]) NOT AT [facility]    Imaging Review US Ob Comp Less 14 Wks  12/28/2015  CLINICAL DATA:  25 year old female with lower abdominal pain and positive pregnancy test. Initial encounter. Estimated gestational age by LMP 5 weeks 0 days. EXAM: OBSTETRIC <14 WK ULTRASOUND TECHNIQUE: Transabdominal ultrasound  was performed for evaluation of the gestation as well as the maternal uterus and adnexal regions. COMPARISON:  None relevant FINDINGS: Intrauterine gestational sac: Single Yolk sac:  Visible Embryo:  Visible Cardiac Activity: Detected Heart Rate: 168 bpm CRL:   25  mm   9 w 2 d Subchorionic hemorrhage: Small volume subchorionic hemorrhage (image 60). Maternal uterus/adnexae: Both ovaries are normal, the right measures 4.9 x 3.2 x 3.9 cm in the left measures 3.5 x 2.1 x 3.0 cm. No pelvic free fluid. IMPRESSION: Single living IUP demonstrated. Small volume subchorionic hemorrhage but no other acute maternal findings visualized. Electronically Signed   By: Odessa Fleming M.D.   On: 12/28/2015 10:06   I have personally reviewed and evaluated these images and lab results as part of my medical decision-making.   EKG Interpretation None  MDM   Final diagnoses:  Abdominal pain in pregnancy    Patient's ultrasound shows an IUP, no obvious ectopic pregnancy. Patient is a G4P3 now. Initial urine contaminated with possible infection but repeat urine shows no bacteria and only trace leukocytes. Do not think this represents UTI. No urinary symptoms. We'll send for a culture. Patient has very low concern for STI, will send for culture but not treat at this point. No current vaginal bleeding or concern for miscarriage at this time. Overall appears well and declines medicines in the ED. Discussed importance of starting prenatal vitamins and following up with her old OB/GYN or any new OB/GYN. Discussed return precautions.   Pricilla LovelessScott Hugh Kamara, MD 12/28/15 [phone]

## 2015-12-28 NOTE — ED Notes (Signed)
Pt in BR to obtain urine sample. Ambulated to room 6 without difficulty

## 2015-12-29 LAB — HIV ANTIBODY (ROUTINE TESTING W REFLEX): HIV SCREEN 4TH GENERATION: NONREACTIVE

## 2015-12-29 LAB — GC/CHLAMYDIA PROBE AMP (~~LOC~~) NOT AT ARMC
CHLAMYDIA, DNA PROBE: NEGATIVE
NEISSERIA GONORRHEA: NEGATIVE

## 2015-12-29 LAB — URINE CULTURE

## 2015-12-29 LAB — RPR: RPR: NONREACTIVE

## 2016-04-12 ENCOUNTER — Encounter (HOSPITAL_BASED_OUTPATIENT_CLINIC_OR_DEPARTMENT_OTHER): Payer: Self-pay | Admitting: *Deleted

## 2016-04-12 ENCOUNTER — Emergency Department (HOSPITAL_BASED_OUTPATIENT_CLINIC_OR_DEPARTMENT_OTHER)
Admission: EM | Admit: 2016-04-12 | Discharge: 2016-04-12 | Disposition: A | Discharge status: Home / Self Care | Payer: Medicaid Other | Attending: Emergency Medicine | Admitting: Emergency Medicine

## 2016-04-12 DIAGNOSIS — R05 Cough: Secondary | ICD-10-CM | POA: Diagnosis not present

## 2016-04-12 DIAGNOSIS — L040 Acute lymphadenitis of face, head and neck: Secondary | ICD-10-CM | POA: Insufficient documentation

## 2016-04-12 DIAGNOSIS — Y999 Unspecified external cause status: Secondary | ICD-10-CM | POA: Diagnosis not present

## 2016-04-12 DIAGNOSIS — Y929 Unspecified place or not applicable: Secondary | ICD-10-CM | POA: Insufficient documentation

## 2016-04-12 DIAGNOSIS — S46911A Strain of unspecified muscle, fascia and tendon at shoulder and upper arm level, right arm, initial encounter: Secondary | ICD-10-CM

## 2016-04-12 DIAGNOSIS — Z87891 Personal history of nicotine dependence: Secondary | ICD-10-CM | POA: Diagnosis not present

## 2016-04-12 DIAGNOSIS — X58XXXA Exposure to other specified factors, initial encounter: Secondary | ICD-10-CM | POA: Diagnosis not present

## 2016-04-12 DIAGNOSIS — R220 Localized swelling, mass and lump, head: Secondary | ICD-10-CM | POA: Diagnosis present

## 2016-04-12 DIAGNOSIS — J45909 Unspecified asthma, uncomplicated: Secondary | ICD-10-CM | POA: Diagnosis not present

## 2016-04-12 DIAGNOSIS — I889 Nonspecific lymphadenitis, unspecified: Secondary | ICD-10-CM

## 2016-04-12 DIAGNOSIS — Y939 Activity, unspecified: Secondary | ICD-10-CM | POA: Diagnosis not present

## 2016-04-12 MED ORDER — AMOXICILLIN-POT CLAVULANATE 875-125 MG PO TABS: 1.0000 | ORAL_TABLET | Freq: Two times a day (BID) | ORAL | 0 refills | Status: DC

## 2016-04-12 MED ORDER — NAPROXEN 500 MG PO TABS: 500.0000 mg | ORAL_TABLET | Freq: Two times a day (BID) | ORAL | 0 refills | Status: DC

## 2016-04-12 NOTE — ED Triage Notes (Signed)
Pt reports left ear swelling and right arm pain x 2 days.  Denies injury.  Slight swelling noted to ear.

## 2016-04-12 NOTE — ED Provider Notes (Signed)
[facility] Provider Note   CSN: [id number] Arrival date & time: 04/12/16  1843  By signing my name below, I, Modena JanskyAlbert Thayil, attest that this documentation has been prepared under the direction and in the presence of Pricilla LovelessScott Laronda Lisby, MD . Electronically Signed: Modena JanskyAlbert Thayil, Scribe. 04/12/2016. 7:45 PM.  History   Chief Complaint Chief Complaint  Patient presents with  . Facial Swelling   The history is provided by the patient. No language interpreter was used.   HPI Comments: Rachel Clay is a 25 y.o. female with a hx of asthma who presents to the Emergency Department complaining of constant moderate left-sided facial pain that started yesterday. She states she has been sick the past two weeks with wheezing and coughing. She describes the facial pain as being in front of her ear, and unchanged in severity. Associated symptoms include facial swelling and right shoulder pain. She reports that movement exacerbates the RUE pain. She has been taking tylenol for her symptoms with minimal relief. Denies any trouble hearing, sneezing, rhinorrhea, or pain with swallowing,   Past Medical History:  Diagnosis Date  . Asthma    childhood    Patient Active Problem List   Diagnosis Date Noted  . 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 Insufficient prenatal care 05/02/2012    Past Surgical History:  Procedure Laterality Date  . NASAL SINUS SURGERY      OB History    Gravida Para Term Preterm AB Living   3 2 2     2    SAB TAB Ectopic Multiple Live Births           2       Home Medications    Prior to Admission medications   Medication Sig Start Date End Date Taking? Authorizing Provider  amoxicillin-clavulanate (AUGMENTIN) 875-125 MG tablet Take 1 tablet by mouth 2 (two) times daily. One po bid x 7 days 04/12/16   Pricilla LovelessScott Feras Gardella, MD  naproxen (NAPROSYN) 500 MG tablet Take 1 tablet (500 mg total) by mouth 2 (two) times daily. 04/12/16   Pricilla LovelessScott Johncarlo Maalouf, MD     Family History Family History  Problem Relation Age of Onset  . Hypertension Mother   . Sleep apnea Daughter     Social History Social History  Substance Use Topics  . Smoking status: Former Games developermoker  . Smokeless tobacco: Never Used  . Alcohol use No     Comment: occasional     Allergies   Review of patient's allergies indicates no known allergies.   Review of Systems Review of Systems  HENT: Positive for facial swelling. Negative for hearing loss, rhinorrhea, sneezing and trouble swallowing.   Respiratory: Positive for cough and wheezing.   Musculoskeletal: Positive for myalgias (Right shoulder).  All other systems reviewed and are negative.    Physical Exam Updated Vital Signs BP 126/88 (BP Location: Left Arm)   Pulse 93   Temp 99 F (37.2 C) (Oral)   Resp 18   Ht 5\' 7"  (1.702 m)   Wt 240 lb (108.9 kg)   LMP 03/18/2016   SpO2 99%   BMI 37.59 kg/m   Physical Exam  Constitutional: She is oriented to person, place, and time. She appears well-developed and well-nourished.  HENT:  Head: Normocephalic and atraumatic.  Right Ear: External ear normal.  Left Ear: Tympanic membrane and ear canal normal.  Nose: Nose normal.  Swelling over pre-auricular skin with mild redness. No obvious abscess  Eyes: Right eye exhibits no discharge.  Left eye exhibits no discharge.  Cardiovascular: Normal rate, regular rhythm and normal heart sounds.   Pulmonary/Chest: Effort normal and breath sounds normal.  Abdominal: Soft. There is no tenderness.  Musculoskeletal:  Mild medial right shoulder TTP. No bony TTP. No decreased ROM.  No obvious axillary lymphadenopathy (with nurse chaperone). Limited by obesity.  Neurological: She is alert and oriented to person, place, and time.  Skin: Skin is warm and dry.  Nursing note and vitals reviewed.    ED Treatments / Results  DIAGNOSTIC STUDIES: Oxygen Saturation is 99% on RA, normal by my interpretation.    COORDINATION OF  CARE: 7:50 PM- Pt advised of plan for treatment and pt agrees.  Labs (all labs ordered are listed, but only abnormal results are displayed) Labs Reviewed - No data to display  EKG  EKG Interpretation None       Radiology No results found.  Procedures Procedures (including critical care time)  Medications Ordered in ED Medications - No data to display   Initial Impression / Assessment and Plan / ED Course  I have reviewed the triage vital signs and the nursing notes.  Pertinent labs & imaging results that were available during my care of the patient were reviewed by me and considered in my medical decision making (see chart for details).  Clinical Course    Lungs currently clear. Overall well appearing. Shoulder appears to be a muscular issue but only mild tenderness, no bony tenderness or decreased ROM. NSAIDs for this as well as the pre-ear pain. This appears to be lymphadenopathy, given redness and pain will cover with antibiotics. Discussed need for a PCP for follow up, especially if the lymphadenopathy doesn't resolve, will need further workup.   Final Clinical Impressions(s) / ED Diagnoses   Final diagnoses:  Preauricular lymphadenitis  Right shoulder strain, initial encounter    New Prescriptions New Prescriptions   AMOXICILLIN-CLAVULANATE (AUGMENTIN) 875-125 MG TABLET    Take 1 tablet by mouth 2 (two) times daily. One po bid x 7 days   NAPROXEN (NAPROSYN) 500 MG TABLET    Take 1 tablet (500 mg total) by mouth 2 (two) times daily.   I personally performed the services described in this documentation, which was scribed in my presence. The recorded information has been reviewed and is accurate.     Pricilla Loveless, MD 04/13/16 1143

## 2016-04-12 NOTE — ED Notes (Signed)
Pt. Reports she has had cold symptoms and now has redness in the L side of her face just to the side of her L eye between her L eye and L ear.

## 2016-05-23 ENCOUNTER — Emergency Department (HOSPITAL_BASED_OUTPATIENT_CLINIC_OR_DEPARTMENT_OTHER)
Admission: EM | Admit: 2016-05-23 | Discharge: 2016-05-23 | Disposition: A | Discharge status: Home / Self Care | Payer: Medicaid Other | Attending: Emergency Medicine | Admitting: Emergency Medicine

## 2016-05-23 ENCOUNTER — Encounter (HOSPITAL_BASED_OUTPATIENT_CLINIC_OR_DEPARTMENT_OTHER): Payer: Self-pay | Admitting: *Deleted

## 2016-05-23 DIAGNOSIS — J069 Acute upper respiratory infection, unspecified: Secondary | ICD-10-CM | POA: Diagnosis not present

## 2016-05-23 DIAGNOSIS — F17200 Nicotine dependence, unspecified, uncomplicated: Secondary | ICD-10-CM | POA: Diagnosis not present

## 2016-05-23 DIAGNOSIS — J45909 Unspecified asthma, uncomplicated: Secondary | ICD-10-CM | POA: Diagnosis not present

## 2016-05-23 DIAGNOSIS — R05 Cough: Secondary | ICD-10-CM

## 2016-05-23 DIAGNOSIS — R079 Chest pain, unspecified: Secondary | ICD-10-CM | POA: Diagnosis present

## 2016-05-23 DIAGNOSIS — R059 Cough, unspecified: Secondary | ICD-10-CM

## 2016-05-23 MED ORDER — BENZONATATE 100 MG PO CAPS: 100.0000 mg | ORAL_CAPSULE | Freq: Three times a day (TID) | ORAL | 0 refills | Status: DC

## 2016-05-23 MED ORDER — ALBUTEROL SULFATE HFA 108 (90 BASE) MCG/ACT IN AERS
2.0000 | INHALATION_SPRAY | Freq: Once | RESPIRATORY_TRACT | Status: AC
  Administered 2016-05-23: 2 via RESPIRATORY_TRACT
  Filled 2016-05-23: qty 6.7

## 2016-05-23 MED ORDER — ALBUTEROL SULFATE HFA 108 (90 BASE) MCG/ACT IN AERS: 1.0000 | INHALATION_SPRAY | Freq: Four times a day (QID) | RESPIRATORY_TRACT | 0 refills | Status: DC | PRN

## 2016-05-23 NOTE — ED Provider Notes (Signed)
Emergency Department Provider Note By signing my name below, I, Teofilo PodMatthew P. Jamison, attest that this documentation has been prepared under the direction and in the presence of Maia PlanJoshua G Long, MD . Electronically Signed: Teofilo PodMatthew P. Jamison, ED Scribe. 05/23/2016. 9:48 PM.   I have reviewed the triage vital signs and the nursing notes.   HISTORY  Chief Complaint URI   HPI Rachel Clay is a 25 y.o. female who presents to the Emergency Department complaining of multiple URI symptoms x 3 weeks. Pt complains of associated chest pain when breathing, wheezing, congestion, cough. Pt is a smoker, and has tried to quit cold [place] but was unable to. Pt reports that her symptoms are exacerbated by smoking. No alleviating factors noted. Pt denies fever, chills.   Past Medical History:  Diagnosis Date  . Asthma    childhood    Patient Active Problem List   Diagnosis Date Noted  . 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 Insufficient prenatal care 05/02/2012    Past Surgical History:  Procedure Laterality Date  . NASAL SINUS SURGERY      Current Outpatient Rx  . Order #: [id number] Class: Print  . Order #: [id number] Class: Print  . Order #: [id number] Class: Print  . Order #: [id number] Class: Print    Allergies Patient has no known allergies.  Family History  Problem Relation Age of Onset  . Hypertension Mother   . Sleep apnea Daughter     Social History Social History  Substance Use Topics  . Smoking status: Current Every Day Smoker  . Smokeless tobacco: Never Used  . Alcohol use No     Comment: occasional    Review of Systems  Constitutional: No fever/chills Eyes: No visual changes. ENT: Positive for nasal congestion. No sore throat. Cardiovascular: Denies chest pain. Respiratory: Positive for cough, wheezing, and chest pain when breathing. Denies shortness of breath. Gastrointestinal: No abdominal pain.  No nausea, no vomiting.  No diarrhea.  No  constipation. Genitourinary: Negative for dysuria. Musculoskeletal: Negative for back pain. Skin: Negative for rash. Neurological: Negative for headaches, focal weakness or numbness.  10-point ROS otherwise negative.  ____________________________________________   PHYSICAL EXAM:  VITAL SIGNS: ED Triage Vitals  Enc Vitals Group     BP 05/23/16 2141 142/81     Pulse Rate 05/23/16 2141 69     Resp 05/23/16 2141 20     Temp 05/23/16 2141 98.2 F (36.8 C)     Temp Source 05/23/16 2141 Oral     SpO2 05/23/16 2141 100 %     Weight 05/23/16 2139 230 lb (104.3 kg)     Height 05/23/16 2139 5\' 7"  (1.702 m)     Pain Score 05/23/16 2139 7   Constitutional: Alert and oriented. Well appearing and in no acute distress. Eyes: Conjunctivae are normal.  Head: Atraumatic. Nose: No congestion/rhinnorhea. Mouth/Throat: Mucous membranes are moist.  Oropharynx non-erythematous. Neck: No stridor.   Cardiovascular: Normal rate, regular rhythm. Good peripheral circulation. Grossly normal heart sounds.   Respiratory: Normal respiratory effort.  No retractions. Lungs CTAB. Gastrointestinal: Soft and nontender. No distention.  Musculoskeletal: No lower extremity tenderness nor edema. No gross deformities of extremities. Neurologic:  Normal speech and language. No gross focal neurologic deficits are appreciated.  Skin:  Skin is warm, dry and intact. No rash noted.  ____________________________________________   PROCEDURES  Procedure(s) performed:   Procedures  None ____________________________________________   INITIAL IMPRESSION / ASSESSMENT AND PLAN / ED COURSE  Pertinent labs & imaging results  that were available during my care of the patient were reviewed by me and considered in my medical decision making (see chart for details).  Patient presents to the ED with symptoms consistent with URI/bronchitis. Reports that it hurts to smoke cigarettes. No fever. No respiratory distress. No  wheezing. Discussed need for smoking cessation in detail. Discharged home with PCP return precautions and tessalon. Plan for PCP follow up.   At this time, I do not feel there is any life-threatening condition present. I have reviewed and discussed all results (EKG, imaging, lab, urine as appropriate), exam findings with patient. I have reviewed nursing notes and appropriate previous records.  I feel the patient is safe to be discharged home without further emergent workup. Discussed usual and customary return precautions. Patient and family (if present) verbalize understanding and are comfortable with this plan.  Patient will follow-up with their primary care provider. If they do not have a primary care provider, information for follow-up has been provided to them. All questions have been answered.   ____________________________________________  FINAL CLINICAL IMPRESSION(S) / ED DIAGNOSES  Final diagnoses:  Viral upper respiratory tract infection  Cough     MEDICATIONS GIVEN DURING THIS VISIT:  Medications  albuterol (PROVENTIL HFA;VENTOLIN HFA) 108 (90 Base) MCG/ACT inhaler 2 puff (2 puffs Inhalation Given 05/23/16 2159)     NEW OUTPATIENT MEDICATIONS STARTED DURING THIS VISIT:  Discharge Medication List as of 05/23/2016  9:52 PM    START taking these medications   Details  albuterol (PROVENTIL HFA;VENTOLIN HFA) 108 (90 Base) MCG/ACT inhaler Inhale 1-2 puffs into the lungs every 6 (six) hours as needed for wheezing or shortness of breath., Starting Mon 05/23/2016, Print    benzonatate (TESSALON) 100 MG capsule Take 1 capsule (100 mg total) by mouth every 8 (eight) hours., Starting Mon 05/23/2016, Print        Note:  This document was prepared using Dragon voice recognition software and may include unintentional dictation errors.  Alona BeneJoshua Long, MD Emergency Medicine  I personally performed the services described in this documentation, which was scribed in my presence. The recorded  information has been reviewed and is accurate.       Maia PlanJoshua G Long, MD 05/24/16 1022

## 2016-05-23 NOTE — Discharge Instructions (Signed)

## 2016-05-23 NOTE — ED Triage Notes (Signed)
Cough, sneezing, runny nose and chest congestion for a week.

## 2016-06-23 ENCOUNTER — Emergency Department (HOSPITAL_BASED_OUTPATIENT_CLINIC_OR_DEPARTMENT_OTHER)
Admission: EM | Admit: 2016-06-23 | Discharge: 2016-06-23 | Disposition: A | Discharge status: Home / Self Care | Payer: Medicaid Other | Attending: Emergency Medicine | Admitting: Emergency Medicine

## 2016-06-23 ENCOUNTER — Encounter (HOSPITAL_BASED_OUTPATIENT_CLINIC_OR_DEPARTMENT_OTHER): Payer: Self-pay | Admitting: *Deleted

## 2016-06-23 ENCOUNTER — Emergency Department (HOSPITAL_BASED_OUTPATIENT_CLINIC_OR_DEPARTMENT_OTHER): Payer: Medicaid Other

## 2016-06-23 DIAGNOSIS — Z3A10 10 weeks gestation of pregnancy: Secondary | ICD-10-CM | POA: Diagnosis not present

## 2016-06-23 DIAGNOSIS — O99331 Smoking (tobacco) complicating pregnancy, first trimester: Secondary | ICD-10-CM | POA: Insufficient documentation

## 2016-06-23 DIAGNOSIS — Z79899 Other long term (current) drug therapy: Secondary | ICD-10-CM | POA: Insufficient documentation

## 2016-06-23 DIAGNOSIS — F17200 Nicotine dependence, unspecified, uncomplicated: Secondary | ICD-10-CM | POA: Insufficient documentation

## 2016-06-23 DIAGNOSIS — O200 Threatened abortion: Secondary | ICD-10-CM | POA: Diagnosis not present

## 2016-06-23 DIAGNOSIS — O209 Hemorrhage in early pregnancy, unspecified: Secondary | ICD-10-CM | POA: Diagnosis present

## 2016-06-23 DIAGNOSIS — J45909 Unspecified asthma, uncomplicated: Secondary | ICD-10-CM | POA: Diagnosis not present

## 2016-06-23 DIAGNOSIS — N939 Abnormal uterine and vaginal bleeding, unspecified: Secondary | ICD-10-CM

## 2016-06-23 LAB — WET PREP, GENITAL
Sperm: NONE SEEN
TRICH WET PREP: NONE SEEN
YEAST WET PREP: NONE SEEN

## 2016-06-23 LAB — URINALYSIS, ROUTINE W REFLEX MICROSCOPIC

## 2016-06-23 LAB — HCG, QUANTITATIVE, PREGNANCY: hCG, Beta Chain, Quant, S: 50 m[IU]/mL — ABNORMAL HIGH (ref <5)

## 2016-06-23 LAB — URINALYSIS, MICROSCOPIC (REFLEX)

## 2016-06-23 LAB — PREGNANCY, URINE: PREG TEST UR: POSITIVE — AB

## 2016-06-23 MED ORDER — METRONIDAZOLE 500 MG PO TABS: 500.0000 mg | ORAL_TABLET | Freq: Two times a day (BID) | ORAL | 0 refills | Status: DC

## 2016-06-23 MED ORDER — FOSFOMYCIN TROMETHAMINE 3 G PO PACK
3.0000 g | PACK | Freq: Once | ORAL | Status: AC
  Administered 2016-06-23: 3 g via ORAL
  Filled 2016-06-23: qty 3

## 2016-06-23 NOTE — ED Triage Notes (Signed)
Pt reports positive hpt 2 weeks ago. Yesterday had dime sized spot of blood on her underwear, this am has had continuous light spotting and mild cramping.

## 2016-06-23 NOTE — Discharge Instructions (Signed)
Follow up here or at your OB in 2 days for repeat US and test to see how pregnant you are.

## 2016-06-23 NOTE — ED Provider Notes (Signed)
[facility]   CSN: [id number] Arrival date & time: 06/23/16  4098     History   Chief Complaint Chief Complaint  Patient presents with  . Vaginal Bleeding    HPI Rachel Clay is a 25 y.o. female.  25 yo F with a chief complaint of vaginal bleeding. Patient took a pregnancy test couple weeks ago that was positive. Patient is a gravida 4 para 3. Having some lower pelvic cramping as well. Started with spotting and now getting little bit more intense. Still less than normal menstrual cycle. Denies dysuria denies increased frequency. Denies abdominal pain.   The history is provided by the patient.  Vaginal Bleeding  Primary symptoms include pelvic pain, vaginal bleeding.  Primary symptoms include no dysuria. There has been no fever. This is a new problem. The current episode started yesterday. The problem occurs constantly. The problem has been gradually worsening. She is pregnant. She has missed her period. Her LMP was weeks ago. The patient's menstrual history has been regular. Pertinent negatives include no nausea, no vomiting and no dizziness. She has tried nothing for the symptoms. The treatment provided no relief. Sexual activity: non-contributory. She uses nothing for contraception.    Past Medical History:  Diagnosis Date  . Asthma    childhood    Patient Active Problem List   Diagnosis Date Noted  . 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 Insufficient prenatal care 05/02/2012    Past Surgical History:  Procedure Laterality Date  . NASAL SINUS SURGERY      OB History    Gravida Para Term Preterm AB Living   3 2 2     2    SAB TAB Ectopic Multiple Live Births           2       Home Medications    Prior to Admission medications   Medication Sig Start Date End Date Taking? Authorizing Provider  albuterol (PROVENTIL HFA;VENTOLIN HFA) 108 (90 Base) MCG/ACT inhaler Inhale 1-2 puffs into the lungs every 6 (six) hours as needed  for wheezing or shortness of breath. 05/23/16   Maia Plan, MD  metroNIDAZOLE (FLAGYL) 500 MG tablet Take 1 tablet (500 mg total) by mouth 2 (two) times daily. One po bid x 7 days 06/23/16   Melene Plan, DO    Family History Family History  Problem Relation Age of Onset  . Hypertension Mother   . Sleep apnea Daughter     Social History Social History  Substance Use Topics  . Smoking status: Current Every Day Smoker  . Smokeless tobacco: Never Used  . Alcohol use No     Comment: occasional     Allergies   Patient has no known allergies.   Review of Systems Review of Systems  Constitutional: Negative for chills and fever.  HENT: Negative for congestion and rhinorrhea.   Eyes: Negative for redness and visual disturbance.  Respiratory: Negative for shortness of breath and wheezing.   Cardiovascular: Negative for chest pain and palpitations.  Gastrointestinal: Negative for nausea and vomiting.  Genitourinary: Positive for pelvic pain and vaginal bleeding. Negative for dysuria and urgency.  Musculoskeletal: Negative for arthralgias and myalgias.  Skin: Negative for pallor and wound.  Neurological: Negative for dizziness and headaches.     Physical Exam Updated Vital Signs BP 106/65 (BP Location: Right Arm)   Pulse 70   Temp 98.6 F (37 C) (Oral)   Resp 18   Ht 5\' 7"  (1.702  m)   Wt 230 lb (104.3 kg)   LMP 05/14/2016   SpO2 100%   BMI 36.02 kg/m   Physical Exam  Constitutional: She is oriented to person, place, and time. She appears well-developed and well-nourished. No distress.  HENT:  Head: Normocephalic and atraumatic.  Eyes: EOM are normal. Pupils are equal, round, and reactive to light.  Neck: Normal range of motion. Neck supple.  Cardiovascular: Normal rate and regular rhythm.  Exam reveals no gallop and no friction rub.   No murmur heard. Pulmonary/Chest: Effort normal. She has no wheezes. She has no rales.  Abdominal: Soft. She exhibits no distension and  no mass. There is no tenderness. There is no guarding.  Genitourinary: Uterus is tender. Cervix exhibits no motion tenderness and no discharge. Right adnexum displays no mass and no tenderness. Left adnexum displays no mass and no tenderness. There is bleeding in the vagina.  Genitourinary Comments: Blood in the vault  Musculoskeletal: She exhibits no edema or tenderness.  Neurological: She is alert and oriented to person, place, and time.  Skin: Skin is warm and dry. She is not diaphoretic.  Psychiatric: She has a normal mood and affect. Her behavior is normal.  Nursing note and vitals reviewed.    ED Treatments / Results  Labs (all labs ordered are listed, but only abnormal results are displayed) Labs Reviewed  WET PREP, GENITAL - Abnormal; Notable for the following:       Result Value   Clue Cells Wet Prep HPF POC PRESENT (*)    WBC, Wet Prep HPF POC MODERATE (*)    All other components within normal limits  PREGNANCY, URINE - Abnormal; Notable for the following:    Preg Test, Ur POSITIVE (*)    All other components within normal limits  URINALYSIS, ROUTINE W REFLEX MICROSCOPIC - Abnormal; Notable for the following:    Color, Urine RED (*)    APPearance CLOUDY (*)    Glucose, UA   (*)    Value: TEST NOT REPORTED DUE TO COLOR INTERFERENCE OF URINE PIGMENT   Hgb urine dipstick   (*)    Value: TEST NOT REPORTED DUE TO COLOR INTERFERENCE OF URINE PIGMENT   Bilirubin Urine   (*)    Value: TEST NOT REPORTED DUE TO COLOR INTERFERENCE OF URINE PIGMENT   Ketones, ur   (*)    Value: TEST NOT REPORTED DUE TO COLOR INTERFERENCE OF URINE PIGMENT   Protein, ur   (*)    Value: TEST NOT REPORTED DUE TO COLOR INTERFERENCE OF URINE PIGMENT   Nitrite   (*)    Value: TEST NOT REPORTED DUE TO COLOR INTERFERENCE OF URINE PIGMENT   Leukocytes, UA   (*)    Value: TEST NOT REPORTED DUE TO COLOR INTERFERENCE OF URINE PIGMENT   All other components within normal limits  HCG, QUANTITATIVE, PREGNANCY  - Abnormal; Notable for the following:    hCG, Beta Chain, Quant, S 50 (*)    All other components within normal limits  URINALYSIS, MICROSCOPIC (REFLEX) - Abnormal; Notable for the following:    Bacteria, UA MANY (*)    Squamous Epithelial / LPF 0-5 (*)    All other components within normal limits  GC/CHLAMYDIA PROBE AMP () NOT AT [facility]    EKG  EKG Interpretation None       Radiology [place] Ob Comp < 14 Wks  Result Date: 06/23/2016 CLINICAL DATA:  Vaginal bleeding for 2 days EXAM: OBSTETRIC <14 WK  [place] AND TRANSVAGINAL OB US TECHNIQUE: Both transabdominal and transvaginal ultrasound examinations were performed for complete evaluation of the gestation as well as the maternal uterus, adnexal regions, and pelvic cul-de-sac. Transvaginal technique was performed to assess early pregnancy. COMPARISON:  None. FINDINGS: Intrauterine gestational sac: None visualized Yolk sac:  None visualized Embryo:  None visualized Cardiac Activity: Heart Rate:   bpm MSD:   mm    w     d CRL:    mm    w    d                  [place] EDC: Subchorionic hemorrhage:  None visualized. Maternal uterus/adnexae: No adnexal masses or free fluid. IMPRESSION: No intrauterine pregnancy visualized. Differential considerations would include early intrauterine pregnancy too early to visualize, spontaneous abortion, or occult ectopic pregnancy. Recommend close clinical followup and serial quantitative beta HCGs and ultrasounds. Electronically Signed   By: Charlett Nose M.D.   On: 06/23/2016 11:25   US Ob Transvaginal  Result Date: 06/23/2016 CLINICAL DATA:  Vaginal bleeding for 2 days EXAM: OBSTETRIC <14 WK [place] AND TRANSVAGINAL OB US TECHNIQUE: Both transabdominal and transvaginal ultrasound examinations were performed for complete evaluation of the gestation as well as the maternal uterus, adnexal regions, and pelvic cul-de-sac. Transvaginal technique was performed to assess early pregnancy. COMPARISON:  None. FINDINGS: Intrauterine  gestational sac: None visualized Yolk sac:  None visualized Embryo:  None visualized Cardiac Activity: Heart Rate:   bpm MSD:   mm    w     d CRL:    mm    w    d                  [place] EDC: Subchorionic hemorrhage:  None visualized. Maternal uterus/adnexae: No adnexal masses or free fluid. IMPRESSION: No intrauterine pregnancy visualized. Differential considerations would include early intrauterine pregnancy too early to visualize, spontaneous abortion, or occult ectopic pregnancy. Recommend close clinical followup and serial quantitative beta HCGs and ultrasounds. Electronically Signed   By: Charlett Nose M.D.   On: 06/23/2016 11:25    Procedures Procedures (including critical care time) EMERGENCY DEPARTMENT [place] PREGNANCY "Study: Limited Ultrasound of the Pelvis"  INDICATIONS:Pregnancy(required) and Vaginal bleeding Multiple views of the uterus and pelvic cavity are obtained with a multi-frequency probe.  APPROACH:Transabdominal   PERFORMED BY: Myself  IMAGES ARCHIVED?: Yes  LIMITATIONS: Body habitus and Decompressed bladder  PREGNANCY FREE FLUID: None  PREGNANCY UTERUS FINDINGS:Uterus enlarged and Non-specific intrauterine fluid noted ADNEXAL FINDINGS:Left ovary not seen and Right ovary not seen  PREGNANCY FINDINGS: No yolk sac noted and No fetal pole seen  INTERPRETATION: No visualized intrauterine pregnancy and Pelvic free fluid absent  GESTATIONAL AGE, ESTIMATE: n/a  Medications Ordered in ED Medications  fosfomycin (MONUROL) packet 3 g (3 g Oral Given 06/23/16 1106)     Initial Impression / Assessment and Plan / ED Course  I have reviewed the triage vital signs and the nursing notes.  Pertinent labs & imaging results that were available during my care of the patient were reviewed by me and considered in my medical decision making (see chart for details).  Clinical Course     25 yo F With a chief complaint of vaginal bleeding. Patient had a positive home pregnancy test.  Initial bedside ultrasound after urinating with no IUP.  Formal US also without IUP, no noted ectopic.  Patient stable, given strict return precautions.  OB or ED follow up in 48 hours  for repeat quant us.   11:51 AM:  I have discussed the diagnosis/risks/treatment options with the patient and family and believe the pt to be eligible for discharge home to follow-up with OB/ED. We also discussed returning to the ED immediately if new or worsening sx occur. We discussed the sx which are most concerning (e.g., sudden worsening pain, fever, inability to tolerate by mouth, sudden worsening bleeding, syncope) that necessitate immediate return. Medications administered to the patient during their visit and any new prescriptions provided to the patient are listed below.  Medications given during this visit Medications  fosfomycin (MONUROL) packet 3 g (3 g Oral Given 06/23/16 1106)     The patient appears reasonably screen and/or stabilized for discharge and I doubt any other medical condition or other [facility] requiring further screening, evaluation, or treatment in the ED at this time prior to discharge.    Final Clinical Impressions(s) / ED Diagnoses   Final diagnoses:  Vaginal bleeding  Threatened miscarriage    New Prescriptions New Prescriptions   METRONIDAZOLE (FLAGYL) 500 MG TABLET    Take 1 tablet (500 mg total) by mouth 2 (two) times daily. One po bid x 7 days     Melene Planan Gevorg Brum, DO 06/23/16 1151

## 2016-06-23 NOTE — ED Notes (Addendum)
ED Provider at bedside with u/s.

## 2016-06-24 LAB — GC/CHLAMYDIA PROBE AMP (~~LOC~~) NOT AT ARMC
CHLAMYDIA, DNA PROBE: NEGATIVE
NEISSERIA GONORRHEA: NEGATIVE

## 2016-11-01 ENCOUNTER — Emergency Department (HOSPITAL_BASED_OUTPATIENT_CLINIC_OR_DEPARTMENT_OTHER)
Admission: EM | Admit: 2016-11-01 | Discharge: 2016-11-01 | Disposition: A | Discharge status: Home / Self Care | Payer: Medicaid Other | Attending: Emergency Medicine | Admitting: Emergency Medicine

## 2016-11-01 ENCOUNTER — Emergency Department (HOSPITAL_BASED_OUTPATIENT_CLINIC_OR_DEPARTMENT_OTHER): Payer: Medicaid Other

## 2016-11-01 ENCOUNTER — Encounter (HOSPITAL_BASED_OUTPATIENT_CLINIC_OR_DEPARTMENT_OTHER): Payer: Self-pay

## 2016-11-01 DIAGNOSIS — Z79899 Other long term (current) drug therapy: Secondary | ICD-10-CM | POA: Diagnosis not present

## 2016-11-01 DIAGNOSIS — O99512 Diseases of the respiratory system complicating pregnancy, second trimester: Secondary | ICD-10-CM | POA: Diagnosis not present

## 2016-11-01 DIAGNOSIS — Z3A18 18 weeks gestation of pregnancy: Secondary | ICD-10-CM | POA: Insufficient documentation

## 2016-11-01 DIAGNOSIS — J189 Pneumonia, unspecified organism: Secondary | ICD-10-CM

## 2016-11-01 DIAGNOSIS — Z87891 Personal history of nicotine dependence: Secondary | ICD-10-CM | POA: Insufficient documentation

## 2016-11-01 DIAGNOSIS — J45909 Unspecified asthma, uncomplicated: Secondary | ICD-10-CM | POA: Insufficient documentation

## 2016-11-01 DIAGNOSIS — J181 Lobar pneumonia, unspecified organism: Secondary | ICD-10-CM | POA: Diagnosis not present

## 2016-11-01 DIAGNOSIS — O26892 Other specified pregnancy related conditions, second trimester: Secondary | ICD-10-CM | POA: Diagnosis present

## 2016-11-01 MED ORDER — AZITHROMYCIN 250 MG PO TABS: ORAL_TABLET | ORAL | 0 refills | Status: DC

## 2016-11-01 MED ORDER — AZITHROMYCIN 250 MG PO TABS
500.0000 mg | ORAL_TABLET | Freq: Once | ORAL | Status: AC
  Administered 2016-11-01: 500 mg via ORAL
  Filled 2016-11-01: qty 2

## 2016-11-01 MED FILL — AZITHROMYCIN 250 MG TABLET: 250 | 5 days supply | Qty: 6 | Fill #0

## 2016-11-01 NOTE — ED Triage Notes (Signed)
c/o flu like sx x 1 week-NAD-steady gait

## 2016-11-01 NOTE — Discharge Instructions (Signed)
Please read and follow all provided instructions.  Your diagnoses today include:  1. Community acquired pneumonia of left lower lobe of lung (HCC)     Tests performed today include: Blood counts and electrolytes Chest x-ray -- shows pneumonia Vital signs. See below for your results today.   Medications prescribed:   Take any prescribed medications only as directed.  Home care instructions:  Follow any educational materials contained in this packet.  Take the complete course of antibiotics that you were prescribed.   BE VERY CAREFUL not to take multiple medicines containing Tylenol (also called acetaminophen). Doing so can lead to an overdose which can damage your liver and cause liver failure and possibly death.   Follow-up instructions: Please follow-up with your primary care provider in the next 3 days for further evaluation of your symptoms and to ensure resolution of your infection.   Return instructions:  Please return to the Emergency Department if you experience worsening symptoms.  Return immediately with worsening breathing, worsening shortness of breath, or if you feel it is taking you more effort to breathe.  Please return if you have any other emergent concerns.  Additional Information:  Your vital signs today were: BP 133/81 (BP Location: Left Arm)    Pulse 98    Temp 99.3 F (37.4 C) (Oral)    Resp 20    Ht  (1.702 m)    Wt 107 kg    LMP 05/16/2016    SpO2 99%    BMI 36.96 kg/m  If your blood pressure (BP) was elevated above 135/85 this visit, please have this repeated by your doctor within one month. --------------

## 2016-11-01 NOTE — ED Provider Notes (Signed)
[facility]   CSN: [id number] Arrival date & time: 11/01/16  1230     History   Chief Complaint Chief Complaint  Patient presents with  . Cough    HPI Osha Dieter is a 26 y.o. female.  HPI  26 y.o. female presents to the Emergency Department today complaining of cough x 1 week. Subjective fevers. No N/V. No diarrhea. No CP/SOB. Notes productive cough. Seen by PCP yesterday and given cough medications. No meds PTA. Of note, pt [redacted] weeks gestation. No abdominal pain. No vaginal bleeding. No other symptoms noted.   Past Medical History:  Diagnosis Date  . Asthma    childhood    Patient Active Problem List   Diagnosis Date Noted  . 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 Insufficient prenatal care 05/02/2012    Past Surgical History:  Procedure Laterality Date  . NASAL SINUS SURGERY      OB History    Gravida Para Term Preterm AB Living   SAB TAB Ectopic Multiple Live Births           2       Home Medications    Prior to Admission medications   Medication Sig Start Date End Date Taking? Authorizing Provider  Pseudoephedrine-Guaifenesin [facility] D PO) Take by mouth.   Yes Historical Provider, MD  albuterol (PROVENTIL HFA;VENTOLIN HFA) 108 (90 Base) MCG/ACT inhaler Inhale 1-2 puffs into the lungs every 6 (six) hours as needed for wheezing or shortness of breath. 05/23/16   Maia Plan, MD    Family History Family History  Problem Relation Age of Onset  . Hypertension Mother   . Sleep apnea Daughter     Social History Social History  Substance Use Topics  . Smoking status: Former Games developer  . Smokeless tobacco: Never Used  . Alcohol use No     Allergies   Patient has no known allergies.   Review of Systems Review of Systems  Constitutional: Positive for fever.  HENT: Positive for congestion and sore throat.   Respiratory: Positive for cough.   Gastrointestinal: Negative for diarrhea, nausea and  vomiting.  Allergic/Immunologic: Negative for immunocompromised state.   Physical Exam Updated Vital Signs BP 133/81 (BP Location: Left Arm)   Pulse 98   Temp 99.3 F (37.4 C) (Oral)   Resp 20   Ht  (1.702 m)   Wt 107 kg   LMP 05/16/2016   SpO2 99%   BMI 36.96 kg/m   Physical Exam  Constitutional: She is oriented to person, place, and time. She appears well-developed and well-nourished. No distress.  HENT:  Head: Normocephalic and atraumatic.  Right Ear: Tympanic membrane, external ear and ear canal normal.  Left Ear: Tympanic membrane, external ear and ear canal normal.  Nose: Nose normal.  Mouth/Throat: Uvula is midline, oropharynx is clear and moist and mucous membranes are normal. No trismus in the jaw. No oropharyngeal exudate, posterior oropharyngeal erythema or tonsillar abscesses.  Eyes: EOM are normal. Pupils are equal, round, and reactive to light.  Neck: Normal range of motion. Neck supple. No tracheal deviation present.  Cardiovascular: Normal rate, regular rhythm, S1 normal, S2 normal, normal heart sounds, intact distal pulses and normal pulses.   Pulmonary/Chest: Effort normal. No respiratory distress. She has decreased breath sounds in the left lower field. She has no wheezes. She has no rhonchi. She has no rales.  Abdominal: Normal appearance and bowel  sounds are normal. There is no tenderness.  Musculoskeletal: Normal range of motion.  Neurological: She is alert and oriented to person, place, and time.  Skin: Skin is warm and dry.  Psychiatric: She has a normal mood and affect. Her speech is normal and behavior is normal. Thought content normal.   ED Treatments / Results  Labs (all labs ordered are listed, but only abnormal results are displayed) Labs Reviewed - No data to display  EKG  EKG Interpretation None       Radiology Dg Chest 2 View  Result Date: 11/01/2016 CLINICAL DATA:  Flu-like symptoms for the past week including cough, chest  congestion, fever, any ear pains. Patient is currently pregnant. EXAM: CHEST  2 VIEW COMPARISON:  Chest x-ray of September 09, 2012 FINDINGS: There is patchy infiltrate in the left lower lobe. There is no significant pleural effusion. There are coarse lung markings in the retrosternal region inferiorly which may lie in the anterior aspect of the right middle lobe. The heart and pulmonary vascularity are normal. The trachea is midline. The bony thorax is unremarkable. IMPRESSION: Left lower lobe pneumonia. Possible anterior right middle lobe atelectasis or pneumonia. Electronically Signed   By: David  [place] M.D.   On: 11/01/2016 12:57    Procedures Procedures (including critical care time)  Medications Ordered in ED Medications - No data to display   Initial Impression / Assessment and Plan / ED Course  I have reviewed the triage vital signs and the nursing notes.  Pertinent labs & imaging results that were available during my care of the patient were reviewed by me and considered in my medical decision making (see chart for details).  Final Clinical Impressions(s) / ED Diagnoses   {I have reviewed and evaluated the relevant imaging studies.  {I have reviewed the relevant previous healthcare records.  {I obtained HPI from historian.   ED Course:  Assessment: Pt is a 26 y.o. female presents with cough x 1 week. Subjective fevers. No N/V/D. On exam, pt in NAD. VSS. Afebrile. Lungs CTA, Heart RRR. Abdomen nontender/soft. Pt CXR will left lower lobe PNA. VSS. Normotensive. Afebrile. Given Azithro. Pt will be discharged with symptomatic treatment.  Verbalizes understanding and is agreeable with plan. Pt is hemodynamically stable & in NAD prior to dc.  Disposition/Plan:  DC Home Additional Verbal discharge instructions given and discussed with patient.  Pt Instructed to f/u with PCP in the next week for evaluation and treatment of symptoms. Return precautions given Pt acknowledges and agrees with  plan  Supervising Physician Zadie Rhine, MD  Final diagnoses:  Community acquired pneumonia of left lower lobe of lung [facility])    New Prescriptions New Prescriptions   No medications on file     Audry Pili, PA-C 11/01/16 1317    Zadie Rhine, MD 11/01/16 [phone]

## 2017-11-23 ENCOUNTER — Emergency Department (HOSPITAL_BASED_OUTPATIENT_CLINIC_OR_DEPARTMENT_OTHER)
Admission: EM | Admit: 2017-11-23 | Discharge: 2017-11-23 | Disposition: A | Discharge status: Home / Self Care | Payer: Self-pay | Attending: Emergency Medicine | Admitting: Emergency Medicine

## 2017-11-23 ENCOUNTER — Encounter (HOSPITAL_BASED_OUTPATIENT_CLINIC_OR_DEPARTMENT_OTHER): Payer: Self-pay

## 2017-11-23 DIAGNOSIS — R030 Elevated blood-pressure reading, without diagnosis of hypertension: Secondary | ICD-10-CM | POA: Insufficient documentation

## 2017-11-23 DIAGNOSIS — Z5321 Procedure and treatment not carried out due to patient leaving prior to being seen by health care provider: Secondary | ICD-10-CM | POA: Insufficient documentation

## 2017-11-23 NOTE — ED Notes (Signed)
Pt not in room when RN entered

## 2017-11-23 NOTE — ED Triage Notes (Signed)
Pt states her mother in law took her BP today and it was elevated-pt with no hx-NAD-steady gait

## 2018-02-12 ENCOUNTER — Encounter (HOSPITAL_BASED_OUTPATIENT_CLINIC_OR_DEPARTMENT_OTHER): Payer: Self-pay | Admitting: *Deleted

## 2018-02-12 ENCOUNTER — Other Ambulatory Visit: Payer: Self-pay

## 2018-02-12 ENCOUNTER — Emergency Department (HOSPITAL_BASED_OUTPATIENT_CLINIC_OR_DEPARTMENT_OTHER)
Admission: EM | Admit: 2018-02-12 | Discharge: 2018-02-12 | Disposition: A | Discharge status: Home / Self Care | Payer: Self-pay | Attending: Emergency Medicine | Admitting: Emergency Medicine

## 2018-02-12 ENCOUNTER — Emergency Department (HOSPITAL_BASED_OUTPATIENT_CLINIC_OR_DEPARTMENT_OTHER): Payer: Self-pay

## 2018-02-12 DIAGNOSIS — W19XXXA Unspecified fall, initial encounter: Secondary | ICD-10-CM

## 2018-02-12 DIAGNOSIS — J45909 Unspecified asthma, uncomplicated: Secondary | ICD-10-CM | POA: Insufficient documentation

## 2018-02-12 DIAGNOSIS — Z87891 Personal history of nicotine dependence: Secondary | ICD-10-CM | POA: Insufficient documentation

## 2018-02-12 DIAGNOSIS — Y92480 Sidewalk as the place of occurrence of the external cause: Secondary | ICD-10-CM | POA: Insufficient documentation

## 2018-02-12 DIAGNOSIS — Y999 Unspecified external cause status: Secondary | ICD-10-CM | POA: Insufficient documentation

## 2018-02-12 DIAGNOSIS — Y939 Activity, unspecified: Secondary | ICD-10-CM | POA: Insufficient documentation

## 2018-02-12 DIAGNOSIS — W010XXA Fall on same level from slipping, tripping and stumbling without subsequent striking against object, initial encounter: Secondary | ICD-10-CM | POA: Insufficient documentation

## 2018-02-12 DIAGNOSIS — S161XXA Strain of muscle, fascia and tendon at neck level, initial encounter: Secondary | ICD-10-CM | POA: Insufficient documentation

## 2018-02-12 MED ORDER — CYCLOBENZAPRINE HCL 10 MG PO TABS: 10.0000 mg | ORAL_TABLET | Freq: Three times a day (TID) | ORAL | 0 refills | Status: DC | PRN

## 2018-02-12 MED ORDER — CYCLOBENZAPRINE HCL 10 MG PO TABS
10.0000 mg | ORAL_TABLET | Freq: Once | ORAL | Status: AC
  Administered 2018-02-12: 10 mg via ORAL
  Filled 2018-02-12: qty 1

## 2018-02-12 NOTE — Discharge Instructions (Addendum)
You were seen in the emergency department for neck pain after a fall.  You had a CAT scan that did not show any obvious fractures.  Please continue ibuprofen and Tylenol as needed for pain and we are prescribing you some cyclobenzaprine to help with spasm.  Follow-up with your doctor or return to the emergency department if any worsening symptoms.

## 2018-02-12 NOTE — ED Triage Notes (Signed)
She lost her balance and fell backward 2 days ago. She landed on a concrete side walk. No LOC. Pain to her shoulders, neck and back. She is ambulatory.

## 2018-02-12 NOTE — ED Provider Notes (Signed)
[facility] EMERGENCY DEPARTMENT Provider Note   CSN: [id number] Arrival date & time: 02/12/18  2111     History   Chief Complaint Chief Complaint  Patient presents with  . Fall    HPI Rachel Earlene PlaterDavis is a 27 y.o. female.  She had a fall 2 days ago she lost her balance and landed on concrete.  Since then she has had moderate to severe posterior neck pain and radiation into her upper back.  It is not associate with any numbness or weakness.  She is been trying ibuprofen and Tylenol with any relief.  The pain is increased with any kind of moving or twisting of her neck.  She denies any headache any loss of consciousness.  She denies prior history of any neck issues.  The history is provided by the patient.  Fall  This is a new problem. The current episode started 2 days ago. The problem occurs constantly. The problem has not changed since onset.Pertinent negatives include no chest pain, no abdominal pain, no headaches and no shortness of breath. The symptoms are aggravated by bending and twisting. Nothing relieves the symptoms. She has tried acetaminophen for the symptoms. The treatment provided no relief.    Past Medical History:  Diagnosis Date  . Asthma    childhood    Patient Active Problem List   Diagnosis Date Noted  . 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 Insufficient prenatal care 05/02/2012    Past Surgical History:  Procedure Laterality Date  . NASAL SINUS SURGERY       OB History    Gravida  4   Para  2   Term  2   Preterm      AB      Living  2     SAB      TAB      Ectopic      Multiple      Live Births  2            Home Medications    Prior to Admission medications   Medication Sig Start Date End Date Taking? Authorizing Provider  albuterol (PROVENTIL HFA;VENTOLIN HFA) 108 (90 Base) MCG/ACT inhaler Inhale 1-2 puffs into the lungs every 6 (six) hours as needed for wheezing or shortness of breath. 05/23/16   Long,  Arlyss RepressJoshua G, MD  azithromycin (ZITHROMAX Z-PAK) 250 MG tablet 500mg  PO once daily on day 1. THEN 250mg  PO once daily for 4 days 11/01/16   Audry PiliMohr, Tyler, PA-C  Pseudoephedrine-Guaifenesin [facility](MUCINEX D PO) Take by mouth.    [provider]    Family History Family History  Problem Relation Age of Onset  . Hypertension Mother   . Sleep apnea Daughter     Social History Social History   Tobacco Use  . Smoking status: Former Games developermoker  . Smokeless tobacco: Never Used  Substance Use Topics  . Alcohol use: No  . Drug use: No     Allergies   Patient has no known allergies.   Review of Systems Review of Systems  Constitutional: Negative for fever.  HENT: Negative for sore throat.   Eyes: Negative for visual disturbance.  Respiratory: Negative for shortness of breath.   Cardiovascular: Negative for chest pain.  Gastrointestinal: Negative for abdominal pain.  Genitourinary: Negative for dysuria.  Musculoskeletal: Positive for neck pain. Negative for back pain.  Skin: Negative for rash.  Neurological: Negative for weakness, numbness and headaches.     Physical Exam Updated  Vital Signs BP (!) 143/87   Pulse 85   Temp 98.5 F (36.9 C) (Oral)   Resp 20   Ht 5\' 7"  (1.702 m)   Wt 108.9 kg (240 lb)   LMP 02/05/2018   SpO2 100%   BMI 37.59 kg/m   Physical Exam  Constitutional: She appears well-developed and well-nourished. No distress.  HENT:  Head: Normocephalic and atraumatic.  Eyes: Conjunctivae are normal.  Neck: Normal range of motion.  She has no midline posterior neck pain.  She is quite tender through her paracervical and trapezius areas.  Her pain is reproduced by palpation of these areas.  No obvious step-offs.  No bruising or open wounds noted.  Cardiovascular: Normal rate and regular rhythm.  No murmur heard. Pulmonary/Chest: Effort normal and breath sounds normal. No respiratory distress.  Abdominal: Soft. There is no tenderness.  Musculoskeletal: Normal  range of motion. She exhibits no edema.  Neurological: She is alert. She has normal strength. No cranial nerve deficit or sensory deficit. Gait normal. GCS eye subscore is 4. GCS verbal subscore is 5. GCS motor subscore is 6.  Skin: Skin is warm and dry.  Psychiatric: She has a normal mood and affect.  Nursing note and vitals reviewed.    ED Treatments / Results  Labs (all labs ordered are listed, but only abnormal results are displayed) Labs Reviewed - No data to display  EKG None  Radiology Ct Cervical Spine Wo Contrast  Result Date: 02/12/2018 CLINICAL DATA:  Fall with neck pain EXAM: CT CERVICAL SPINE WITHOUT CONTRAST TECHNIQUE: Multidetector CT imaging of the cervical spine was performed without intravenous contrast. Multiplanar CT image reconstructions were also generated. COMPARISON:  None. FINDINGS: Alignment: No static subluxation. Facets are aligned. Occipital condyles and the lateral masses of C1 and C2 are normally approximated. Skull base and vertebrae: No acute fracture. Soft tissues and spinal canal: No prevertebral fluid or swelling. No visible canal hematoma. Disc levels: No advanced spinal canal or neural foraminal stenosis. Upper chest: No pneumothorax, pulmonary nodule or pleural effusion. Other: Normal visualized paraspinal cervical soft tissues. IMPRESSION: No fracture or static subluxation of the cervical spine. Electronically Signed   By: Deatra Robinson M.D.   On: 02/12/2018 22:40    Procedures Procedures (including critical care time)  Medications Ordered in ED Medications  cyclobenzaprine (FLEXERIL) tablet 10 mg (10 mg Oral Given 02/12/18 2315)     Initial Impression / Assessment and Plan / ED Course  I have reviewed the triage vital signs and the nursing notes.  Pertinent labs & imaging results that were available during my care of the patient were reviewed by me and considered in my medical decision making (see chart for details).      Final Clinical  Impressions(s) / ED Diagnoses   Final diagnoses:  Cervical strain, acute, initial encounter  Fall, initial encounter    ED Discharge Orders        Ordered    cyclobenzaprine (FLEXERIL) 10 MG tablet  3 times daily PRN     02/12/18 2301       Terrilee Files, MD 02/12/18 2324

## 2018-06-20 ENCOUNTER — Other Ambulatory Visit: Payer: Self-pay

## 2018-06-20 ENCOUNTER — Emergency Department (HOSPITAL_BASED_OUTPATIENT_CLINIC_OR_DEPARTMENT_OTHER)
Admission: EM | Admit: 2018-06-20 | Discharge: 2018-06-20 | Disposition: A | Discharge status: Home / Self Care | Payer: Self-pay | Attending: Emergency Medicine | Admitting: Emergency Medicine

## 2018-06-20 ENCOUNTER — Encounter (HOSPITAL_BASED_OUTPATIENT_CLINIC_OR_DEPARTMENT_OTHER): Payer: Self-pay | Admitting: Emergency Medicine

## 2018-06-20 ENCOUNTER — Emergency Department (HOSPITAL_BASED_OUTPATIENT_CLINIC_OR_DEPARTMENT_OTHER): Payer: Self-pay

## 2018-06-20 DIAGNOSIS — J189 Pneumonia, unspecified organism: Secondary | ICD-10-CM | POA: Insufficient documentation

## 2018-06-20 DIAGNOSIS — Z720 Tobacco use: Secondary | ICD-10-CM

## 2018-06-20 DIAGNOSIS — F17200 Nicotine dependence, unspecified, uncomplicated: Secondary | ICD-10-CM | POA: Insufficient documentation

## 2018-06-20 DIAGNOSIS — J181 Lobar pneumonia, unspecified organism: Secondary | ICD-10-CM

## 2018-06-20 DIAGNOSIS — J45909 Unspecified asthma, uncomplicated: Secondary | ICD-10-CM | POA: Insufficient documentation

## 2018-06-20 MED ORDER — ALBUTEROL SULFATE HFA 108 (90 BASE) MCG/ACT IN AERS
1.0000 | INHALATION_SPRAY | RESPIRATORY_TRACT | Status: DC | PRN
  Administered 2018-06-20: 2 via RESPIRATORY_TRACT
  Filled 2018-06-20: qty 6.7

## 2018-06-20 MED ORDER — OXYMETAZOLINE HCL 0.05 % NA SOLN
1.0000 | Freq: Once | NASAL | Status: AC
  Administered 2018-06-20: 1 via NASAL
  Filled 2018-06-20: qty 15

## 2018-06-20 MED ORDER — DOXYCYCLINE HYCLATE 100 MG PO TABS
100.0000 mg | ORAL_TABLET | Freq: Once | ORAL | Status: AC
  Administered 2018-06-20: 100 mg via ORAL
  Filled 2018-06-20: qty 1

## 2018-06-20 MED ORDER — AEROCHAMBER PLUS FLO-VU MEDIUM MISC
1.0000 | Freq: Once | Status: AC
  Administered 2018-06-20: 1
  Filled 2018-06-20: qty 1

## 2018-06-20 MED ORDER — DOXYCYCLINE HYCLATE 100 MG PO CAPS: 100.0000 mg | ORAL_CAPSULE | Freq: Two times a day (BID) | ORAL | 0 refills | Status: DC

## 2018-06-20 NOTE — ED Triage Notes (Signed)
Reports productive cough since Saturday.

## 2018-06-20 NOTE — ED Provider Notes (Signed)
[facility] EMERGENCY DEPARTMENT Provider Note   CSN: [id number] Arrival date & time: 06/20/18  4098     History   Chief Complaint Chief Complaint  Patient presents with  . Cough    HPI Rachel Clay is a 27 y.o. female.  Pt presents to the ED today with cough since Saturday, 11/30.  She has been coughing up yellow/green sputum.  No fever.  She does smoke, but is trying to cut down.  She has had sinus congesting and has been taking otc cold meds, but they have not been helping.     Past Medical History:  Diagnosis Date  . Asthma    childhood    Patient Active Problem List   Diagnosis Date Noted  . 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 Insufficient prenatal care 05/02/2012    Past Surgical History:  Procedure Laterality Date  . NASAL SINUS SURGERY       OB History    Gravida  4   Para  2   Term  2   Preterm      AB      Living  2     SAB      TAB      Ectopic      Multiple      Live Births  2            Home Medications    Prior to Admission medications   Medication Sig Start Date End Date Taking? Authorizing Provider  albuterol (PROVENTIL HFA;VENTOLIN HFA) 108 (90 Base) MCG/ACT inhaler Inhale 1-2 puffs into the lungs every 6 (six) hours as needed for wheezing or shortness of breath. 05/23/16   Long, Arlyss Repress, MD  azithromycin (ZITHROMAX Z-PAK) 250 MG tablet 500mg  PO once daily on day 1. THEN 250mg  PO once daily for 4 days 11/01/16   Audry Pili, PA-C  cyclobenzaprine (FLEXERIL) 10 MG tablet Take 1 tablet (10 mg total) by mouth 3 (three) times daily as needed for muscle spasms. 02/12/18   Terrilee Files, MD  doxycycline (VIBRAMYCIN) 100 MG capsule Take 1 capsule (100 mg total) by mouth 2 (two) times daily. 06/20/18   Jacalyn Lefevre, MD  Pseudoephedrine-Guaifenesin [facility] D PO) Take by mouth.    [provider]    Family History Family History  Problem Relation Age of Onset  . Hypertension Mother   .  Sleep apnea Daughter     Social History Social History   Tobacco Use  . Smoking status: Former Games developer  . Smokeless tobacco: Never Used  Substance Use Topics  . Alcohol use: No  . Drug use: No     Allergies   Patient has no known allergies.   Review of Systems Review of Systems  HENT: Positive for congestion.   Respiratory: Positive for cough.   All other systems reviewed and are negative.    Physical Exam Updated Vital Signs BP 126/80   Pulse 81   Temp 98.4 F (36.9 C) (Oral)   Resp 16   Ht 5\' 7"  (1.702 m)   Wt 104.3 kg   LMP 06/06/2018 (Approximate)   SpO2 100%   BMI 36.02 kg/m   Physical Exam  Constitutional: She is oriented to person, place, and time. She appears well-developed and well-nourished.  HENT:  Head: Normocephalic and atraumatic.  Right Ear: External ear normal.  Left Ear: External ear normal.  Nose: Nose normal.  Mouth/Throat: Oropharynx is clear and moist.  Eyes: Pupils are equal, round,  and reactive to light. Conjunctivae and EOM are normal.  Neck: Normal range of motion. Neck supple.  Cardiovascular: Normal rate, regular rhythm, normal heart sounds and intact distal pulses.  Pulmonary/Chest: She has wheezes.  Abdominal: Soft. Bowel sounds are normal.  Musculoskeletal: Normal range of motion.  Neurological: She is alert and oriented to person, place, and time.  Skin: Skin is warm and dry. Capillary refill takes less than 2 seconds.  Psychiatric: She has a normal mood and affect. Her behavior is normal. Judgment and thought content normal.  Nursing note and vitals reviewed.    ED Treatments / Results  Labs (all labs ordered are listed, but only abnormal results are displayed) Labs Reviewed - No data to display  EKG None  Radiology Dg Chest 2 View  Result Date: 06/20/2018 CLINICAL DATA:  Productive cough for several days EXAM: CHEST - 2 VIEW COMPARISON:  11/01/2016 chest radiograph. FINDINGS: Stable cardiomediastinal silhouette  with normal heart size. No pneumothorax. No pleural effusion. No pulmonary edema. There is mild hazy opacity at the anterior left lung base. Clear right lung. IMPRESSION: Mild hazy opacity at the anterior left lung base, cannot exclude mild left lower lobe pneumonia versus residual scarring from prior pneumonia on 2018 radiographs. Consider follow-up chest radiographs as clinically warranted. Electronically Signed   By: Delbert PhenixJason A Poff M.D.   On: 06/20/2018 09:58    Procedures Procedures (including critical care time)  Medications Ordered in ED Medications  albuterol (PROVENTIL HFA;VENTOLIN HFA) 108 (90 Base) MCG/ACT inhaler 1-2 puff (2 puffs Inhalation Given 06/20/18 0912)  oxymetazoline (AFRIN) 0.05 % nasal spray 1 spray (has no administration in time range)  doxycycline (VIBRA-TABS) tablet 100 mg (has no administration in time range)  AEROCHAMBER PLUS FLO-VU MEDIUM MISC 1 each (1 each Other Given 06/20/18 0916)     Initial Impression / Assessment and Plan / ED Course  I have reviewed the triage vital signs and the nursing notes.  Pertinent labs & imaging results that were available during my care of the patient were reviewed by me and considered in my medical decision making (see chart for details).    Pt is given an inhaler plus spacer.  She is encouraged to stop smoking.  She will be started on doxy.  She knows to return if worse.  F/u with pcp.  Final Clinical Impressions(s) / ED Diagnoses   Final diagnoses:  Community acquired pneumonia of left lower lobe of lung (HCC)  Tobacco abuse    ED Discharge Orders         Ordered    doxycycline (VIBRAMYCIN) 100 MG capsule  2 times daily     06/20/18 1009           Jacalyn LefevreHaviland, Chaddrick Brue, MD 06/20/18 1012

## 2018-06-20 NOTE — Progress Notes (Signed)
RT note: RT instructed patient with use albuterol MDI with spacer using teach back method. Patient displaced proper use and understanding.

## 2018-06-25 ENCOUNTER — Emergency Department (HOSPITAL_BASED_OUTPATIENT_CLINIC_OR_DEPARTMENT_OTHER)
Admission: EM | Admit: 2018-06-25 | Discharge: 2018-06-25 | Disposition: A | Discharge status: Home / Self Care | Payer: Self-pay | Attending: Emergency Medicine | Admitting: Emergency Medicine

## 2018-06-25 ENCOUNTER — Other Ambulatory Visit: Payer: Self-pay

## 2018-06-25 ENCOUNTER — Encounter (HOSPITAL_BASED_OUTPATIENT_CLINIC_OR_DEPARTMENT_OTHER): Payer: Self-pay | Admitting: *Deleted

## 2018-06-25 DIAGNOSIS — Z87891 Personal history of nicotine dependence: Secondary | ICD-10-CM | POA: Insufficient documentation

## 2018-06-25 DIAGNOSIS — R519 Headache, unspecified: Secondary | ICD-10-CM

## 2018-06-25 DIAGNOSIS — R51 Headache: Secondary | ICD-10-CM | POA: Insufficient documentation

## 2018-06-25 DIAGNOSIS — Z79899 Other long term (current) drug therapy: Secondary | ICD-10-CM | POA: Insufficient documentation

## 2018-06-25 MED ORDER — PROCHLORPERAZINE EDISYLATE 10 MG/2ML IJ SOLN
10.0000 mg | Freq: Once | INTRAMUSCULAR | Status: AC
  Administered 2018-06-25: 10 mg via INTRAVENOUS
  Filled 2018-06-25: qty 2

## 2018-06-25 MED ORDER — ACETAMINOPHEN 325 MG PO TABS
650.0000 mg | ORAL_TABLET | Freq: Once | ORAL | Status: AC | PRN
  Administered 2018-06-25: 650 mg via ORAL
  Filled 2018-06-25: qty 2

## 2018-06-25 MED ORDER — KETOROLAC TROMETHAMINE 30 MG/ML IJ SOLN
30.0000 mg | Freq: Once | INTRAMUSCULAR | Status: AC
  Administered 2018-06-25: 30 mg via INTRAMUSCULAR
  Filled 2018-06-25: qty 1

## 2018-06-25 MED ORDER — DIPHENHYDRAMINE HCL 50 MG/ML IJ SOLN
25.0000 mg | Freq: Once | INTRAMUSCULAR | Status: AC
  Administered 2018-06-25: 25 mg via INTRAVENOUS
  Filled 2018-06-25: qty 1

## 2018-06-25 NOTE — ED Notes (Addendum)
Pt states she feels better. Pt states she is ready to be d/c home. Voiced understanding of d/c instructions.

## 2018-06-25 NOTE — ED Notes (Signed)
Report received 

## 2018-06-25 NOTE — ED Provider Notes (Signed)
[facility] EMERGENCY DEPARTMENT Provider Note   CSN: [id number] Arrival date & time: 06/25/18  0015     History   Chief Complaint Chief Complaint  Patient presents with  . Headache    HPI Rachel Clay is a 27 y.o. female.  HPI  This is a 27 year old female who presents with headache.  Patient reports recent history of pneumonia diagnosis.  She was seen and evaluated on December 4 and prescribed doxycycline.  She denies any fevers.  She states over the last day she has developed a throbbing headache over the bilateral temples.  She rates her pain at 6 out of 10.  She took ibuprofen for pain with minimal relief.  She denies any neck stiffness.  She does report some congestion and continued upper respiratory symptoms.  Denies worst headache of her life.  Past Medical History:  Diagnosis Date  . Asthma    childhood    Patient Active Problem List   Diagnosis Date Noted  . 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 Insufficient prenatal care 05/02/2012    Past Surgical History:  Procedure Laterality Date  . NASAL SINUS SURGERY       OB History    Gravida  4   Para  2   Term  2   Preterm      AB      Living  2     SAB      TAB      Ectopic      Multiple      Live Births  2            Home Medications    Prior to Admission medications   Medication Sig Start Date End Date Taking? Authorizing Provider  albuterol (PROVENTIL HFA;VENTOLIN HFA) 108 (90 Base) MCG/ACT inhaler Inhale 1-2 puffs into the lungs every 6 (six) hours as needed for wheezing or shortness of breath. 05/23/16  Yes Long, Arlyss Repress, MD  doxycycline (VIBRAMYCIN) 100 MG capsule Take 1 capsule (100 mg total) by mouth 2 (two) times daily. 06/20/18  Yes Jacalyn Lefevre, MD  Pseudoephedrine-Guaifenesin [facility] D PO) Take by mouth.   Yes [provider]  azithromycin (ZITHROMAX Z-PAK) 250 MG tablet 500mg  PO once daily on day 1. THEN 250mg  PO once daily for 4 days  11/01/16   Audry Pili, PA-C  cyclobenzaprine (FLEXERIL) 10 MG tablet Take 1 tablet (10 mg total) by mouth 3 (three) times daily as needed for muscle spasms. 02/12/18   Terrilee Files, MD    Family History Family History  Problem Relation Age of Onset  . Hypertension Mother   . Sleep apnea Daughter     Social History Social History   Tobacco Use  . Smoking status: Former Smoker    Last attempt to quit: 06/20/2018    Years since quitting: 0.0  . Smokeless tobacco: Never Used  Substance Use Topics  . Alcohol use: No  . Drug use: No     Allergies   Patient has no known allergies.   Review of Systems Review of Systems  Constitutional: Negative for chills and fever.  HENT: Positive for congestion.   Eyes: Negative for photophobia and visual disturbance.  Respiratory: Positive for cough. Negative for shortness of breath.   Cardiovascular: Negative for chest pain.  Gastrointestinal: Negative for abdominal pain, nausea and vomiting.  Neurological: Positive for headaches. Negative for dizziness.  All other systems reviewed and are negative.    Physical Exam Updated Vital  Signs BP 124/81 (BP Location: Left Arm)   Pulse 94   Temp 99.1 F (37.3 C) (Oral)   Resp 18   Ht 1.702 m (5\' 7" )   Wt 112.9 kg   LMP 06/06/2018 (Approximate)   SpO2 98%   Breastfeeding? No   BMI 39.00 kg/m   Physical Exam  Constitutional: She is oriented to person, place, and time. She appears well-developed and well-nourished.  Overweight, no acute distress  HENT:  Head: Normocephalic and atraumatic.  Mouth/Throat: Oropharynx is clear and moist.  There is to palpation over the frontal sinuses  Eyes: Pupils are equal, round, and reactive to light.  Neck: Normal range of motion. Neck supple.  No meningismus  Cardiovascular: Normal rate, regular rhythm and normal heart sounds.  Pulmonary/Chest: Effort normal. No respiratory distress. She has no wheezes.  Abdominal: Soft. There is no  tenderness.  Neurological: She is alert and oriented to person, place, and time.  Cranial nerves II through XII intact, 5 out of 5 strength in all 4 extremities, no dysmetria to finger-nose-finger  Skin: Skin is warm and dry.  Psychiatric: She has a normal mood and affect.  Nursing note and vitals reviewed.    ED Treatments / Results  Labs (all labs ordered are listed, but only abnormal results are displayed) Labs Reviewed - No data to display  EKG None  Radiology No results found.  Procedures Procedures (including critical care time)  Medications Ordered in ED Medications  acetaminophen (TYLENOL) tablet 650 mg (650 mg Oral Given 06/25/18 0039)  ketorolac (TORADOL) 30 MG/ML injection 30 mg (30 mg Intramuscular Given 06/25/18 0138)  prochlorperazine (COMPAZINE) injection 10 mg (10 mg Intravenous Given 06/25/18 0244)  diphenhydrAMINE (BENADRYL) injection 25 mg (25 mg Intravenous Given 06/25/18 0244)     Initial Impression / Assessment and Plan / ED Course  I have reviewed the triage vital signs and the nursing notes.  Pertinent labs & imaging results that were available during my care of the patient were reviewed by me and considered in my medical decision making (see chart for details).  Clinical Course as of Jun 25 356  Mon Jun 25, 2018  0235 Patient reports persistent headache after Toradol.  Will place IV and give migraine cocktail.   [CH]    Clinical Course User Index [CH] Emerita Berkemeier, Mayer Maskerourtney F, MD    Patient presents with headache.  This is in the setting of recent diagnosis of pneumonia and URI symptoms.  She is overall nontoxic-appearing.  Temperature 100.2 on evaluation.  She was given Tylenol.  She has no signs or symptoms of meningitis.  Suspect this may be sinus related or related to ongoing upper respiratory illness.  She has no other red flags.  Neuro exam is benign.  She was initially given Toradol with minimal relief.  She was subsequently given Benadryl and  Compazine.  On recheck, she states she feels much better.  She remains neurologically intact.  After history, exam, and medical workup I feel the patient has been appropriately medically screened and is safe for discharge home. Pertinent diagnoses were discussed with the patient. Patient was given return precautions.   Final Clinical Impressions(s) / ED Diagnoses   Final diagnoses:  Acute nonintractable headache, unspecified headache type    ED Discharge Orders    None       Shon BatonHorton, Starsha Morning F, MD 06/25/18 [phone]

## 2018-06-25 NOTE — ED Notes (Signed)
MD in with pt

## 2018-06-25 NOTE — ED Triage Notes (Signed)
Pt states she was Dx'ed with pneumonia on 12/4. She has been taking antibiotics. Reports headache today. Took ibuprofen at [employer] tonight

## 2018-07-22 ENCOUNTER — Emergency Department (HOSPITAL_BASED_OUTPATIENT_CLINIC_OR_DEPARTMENT_OTHER)
Admission: EM | Admit: 2018-07-22 | Discharge: 2018-07-23 | Disposition: A | Discharge status: Home / Self Care | Payer: Self-pay | Attending: Emergency Medicine | Admitting: Emergency Medicine

## 2018-07-22 DIAGNOSIS — J069 Acute upper respiratory infection, unspecified: Secondary | ICD-10-CM | POA: Insufficient documentation

## 2018-07-22 DIAGNOSIS — R062 Wheezing: Secondary | ICD-10-CM | POA: Insufficient documentation

## 2018-07-22 DIAGNOSIS — B9789 Other viral agents as the cause of diseases classified elsewhere: Secondary | ICD-10-CM | POA: Insufficient documentation

## 2018-07-22 DIAGNOSIS — Z79899 Other long term (current) drug therapy: Secondary | ICD-10-CM | POA: Insufficient documentation

## 2018-07-22 NOTE — ED Triage Notes (Signed)
Pt reports cough starting this week, hx of pneumonia last month. Denies fever. Expiratory wheezing in triage.

## 2018-07-23 ENCOUNTER — Other Ambulatory Visit: Payer: Self-pay

## 2018-07-23 ENCOUNTER — Encounter (HOSPITAL_BASED_OUTPATIENT_CLINIC_OR_DEPARTMENT_OTHER): Payer: Self-pay | Admitting: Emergency Medicine

## 2018-07-23 ENCOUNTER — Emergency Department (HOSPITAL_BASED_OUTPATIENT_CLINIC_OR_DEPARTMENT_OTHER): Payer: Self-pay

## 2018-07-23 MED ORDER — IPRATROPIUM-ALBUTEROL 0.5-2.5 (3) MG/3ML IN SOLN
3.0000 mL | Freq: Four times a day (QID) | RESPIRATORY_TRACT | Status: DC
  Administered 2018-07-23: 3 mL via RESPIRATORY_TRACT
  Filled 2018-07-23: qty 3

## 2018-07-23 MED ORDER — DOXYCYCLINE HYCLATE 100 MG PO CAPS: 100.0000 mg | ORAL_CAPSULE | Freq: Two times a day (BID) | ORAL | 0 refills | Status: DC

## 2018-07-23 MED ORDER — ALBUTEROL SULFATE HFA 108 (90 BASE) MCG/ACT IN AERS: 1.0000 | INHALATION_SPRAY | Freq: Four times a day (QID) | RESPIRATORY_TRACT | 0 refills | Status: DC | PRN

## 2018-07-23 NOTE — ED Provider Notes (Signed)
[facility] EMERGENCY DEPARTMENT Provider Note   CSN: [id number] Arrival date & time: 07/22/18  2346     History   Chief Complaint Chief Complaint  Patient presents with  . Cough    HPI Rachel Clay is a 28 y.o. female.  Patient presents with a one-week history of cough.  Is been intermittently productive of clear and white mucus.  She states all of her children at home have been sick but are improving.  She has been trying over-the-counter Robitussin without relief.  Denies fevers, chills, nausea or vomiting.  No rhinorrhea or sore throat.  Good p.o. intake and urine output.  No vomiting or diarrhea.  She does have chest pain with coughing.  She was diagnosed with asthma when she was a child but does not take any regular asthma medications.  She is a previous smoker and last smoked several weeks ago.  She denies any leg pain or leg swelling.  She does have a Mirena IUD.  The history is provided by the patient.  Cough  Associated symptoms include chest pain. Pertinent negatives include no headaches, no rhinorrhea and no myalgias.    Past Medical History:  Diagnosis Date  . Asthma    childhood    Patient Active Problem List   Diagnosis Date Noted  . 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 Insufficient prenatal care 05/02/2012    Past Surgical History:  Procedure Laterality Date  . NASAL SINUS SURGERY       OB History    Gravida  4   Para  2   Term  2   Preterm      AB      Living  2     SAB      TAB      Ectopic      Multiple      Live Births  2            Home Medications    Prior to Admission medications   Medication Sig Start Date End Date Taking? Authorizing Provider  albuterol (PROVENTIL HFA;VENTOLIN HFA) 108 (90 Base) MCG/ACT inhaler Inhale 1-2 puffs into the lungs every 6 (six) hours as needed for wheezing or shortness of breath. 05/23/16   Long, Arlyss Repress, MD  azithromycin (ZITHROMAX Z-PAK) 250 MG tablet 500mg  PO  once daily on day 1. THEN 250mg  PO once daily for 4 days 11/01/16   Audry Pili, PA-C  cyclobenzaprine (FLEXERIL) 10 MG tablet Take 1 tablet (10 mg total) by mouth 3 (three) times daily as needed for muscle spasms. 02/12/18   Terrilee Files, MD  doxycycline (VIBRAMYCIN) 100 MG capsule Take 1 capsule (100 mg total) by mouth 2 (two) times daily. 06/20/18   Jacalyn Lefevre, MD  Pseudoephedrine-Guaifenesin [facility] D PO) Take by mouth.    [provider]    Family History Family History  Problem Relation Age of Onset  . Hypertension Mother   . Sleep apnea Daughter     Social History Social History   Tobacco Use  . Smoking status: Former Smoker    Last attempt to quit: 06/20/2018    Years since quitting: 0.0  . Smokeless tobacco: Never Used  Substance Use Topics  . Alcohol use: No  . Drug use: No     Allergies   Patient has no known allergies.   Review of Systems Review of Systems  Constitutional: Negative for activity change and appetite change.  HENT: Negative for congestion and rhinorrhea.  Respiratory: Positive for cough and chest tightness.   Cardiovascular: Positive for chest pain.  Gastrointestinal: Negative for nausea and vomiting.  Genitourinary: Negative for dysuria, hematuria, vaginal bleeding and vaginal discharge.  Musculoskeletal: Negative for arthralgias and myalgias.  Skin: Negative for rash.  Neurological: Negative for dizziness, weakness and headaches.     Physical Exam Updated Vital Signs BP (!) 157/100 (BP Location: Right Arm)   Pulse 100   Temp 99.8 F (37.7 C) (Oral)   Ht 5\' 7"  (1.702 m)   Wt 102.1 kg   LMP  (LMP Unknown)   SpO2 100%   BMI 35.24 kg/m   Physical Exam Vitals signs and nursing note reviewed.  Constitutional:      General: She is not in acute distress.    Appearance: She is well-developed.     Comments: Speaking in full sentences  HENT:     Head: Normocephalic and atraumatic.     Right Ear: Tympanic membrane  normal.     Left Ear: Tympanic membrane normal.     Nose: Nose normal. No rhinorrhea.     Mouth/Throat:     Pharynx: No oropharyngeal exudate.  Eyes:     Conjunctiva/sclera: Conjunctivae normal.     Pupils: Pupils are equal, round, and reactive to light.  Neck:     Musculoskeletal: Normal range of motion and neck supple.     Comments: No meningismus. Cardiovascular:     Rate and Rhythm: Normal rate and regular rhythm.     Heart sounds: Normal heart sounds. No murmur.  Pulmonary:     Effort: Pulmonary effort is normal. No respiratory distress.     Breath sounds: Wheezing present.  Chest:     Chest wall: Tenderness present.  Abdominal:     Palpations: Abdomen is soft.     Tenderness: There is no abdominal tenderness. There is no guarding or rebound.  Musculoskeletal: Normal range of motion.        General: No tenderness.  Skin:    General: Skin is warm.  Neurological:     Mental Status: She is alert and oriented to person, place, and time.     Cranial Nerves: No cranial nerve deficit.     Motor: No abnormal muscle tone.     Coordination: Coordination normal.     Comments: No ataxia on finger to nose bilaterally. No pronator drift. 5/5 strength throughout. CN 2-12 intact.Equal grip strength. Sensation intact.   Psychiatric:        Behavior: Behavior normal.      ED Treatments / Results  Labs (all labs ordered are listed, but only abnormal results are displayed) Labs Reviewed - No data to display  EKG None  Radiology Dg Chest 2 View  Result Date: 07/23/2018 CLINICAL DATA:  Cough this week. EXAM: CHEST - 2 VIEW COMPARISON:  06/20/2018 FINDINGS: The cardiomediastinal contours are normal. There is bronchial thickening. Streaky left lung base opacities are similar to prior exam, likely scarring. No new focal airspace disease. Pulmonary vasculature is normal. No consolidation, pleural effusion, or pneumothorax. No acute osseous abnormalities are seen. IMPRESSION: Bronchial  thickening. Streaky left lung base opacities are similar to prior exam, likely scarring. No new airspace disease. Electronically Signed   By: Narda RutherfordMelanie  Sanford M.D.   On: 07/23/2018 00:26    Procedures Procedures (including critical care time)  Medications Ordered in ED Medications  ipratropium-albuterol (DUONEB) 0.5-2.5 (3) MG/3ML nebulizer solution 3 mL (3 mLs Nebulization Given 07/23/18 0013)  ipratropium-albuterol (DUONEB) 0.5-2.5 (3) MG/3ML  nebulizer solution 3 mL (3 mLs Nebulization Given 07/23/18 0135)     Initial Impression / Assessment and Plan / ED Course  I have reviewed the triage vital signs and the nursing notes.  Pertinent labs & imaging results that were available during my care of the patient were reviewed by me and considered in my medical decision making (see chart for details).    Previous smoker with 1 week history of cough and chest pain with coughing.  No fevers.  She is in no respiratory distress.  Intermittent wheezing on exam.  No hypoxia.  X-ray negative for new airspace disease.  Does show left basilar scarring.  Wheezing has improved after nebulizers.  Will hold steroids at this time. Patient states she feels better.  We will treat supportively with bronchodilators and antibiotics for bronchitis.  Advised to avoid smoke exposure.  Her chest pain is reproducible.  No hypoxia. Mild tachycardia after albuterol.  Pulmonary embolism seems less likely at this time.  No pleuritic chest pain, hypoxia.  followup with PCP. Return precautions discussed.  Final Clinical Impressions(s) / ED Diagnoses   Final diagnoses:  Viral URI with cough    ED Discharge Orders    None       Intisar Claudio, Jeannett Senior, MD 07/23/18 [phone]

## 2018-07-23 NOTE — Discharge Instructions (Addendum)
Take the antibiotics as prescribed.  Do your best to stop smoking and avoid any smoke exposure.  Use anti-inflammatories such as ibuprofen as needed for your chest pain with coughing.  Return to the ED with difficulty breathing, worsening chest pain, shortness of breath or any other concerns.

## 2018-07-23 NOTE — ED Notes (Signed)
Patient transported to X-ray 

## 2020-04-05 ENCOUNTER — Other Ambulatory Visit: Payer: Self-pay

## 2020-04-05 ENCOUNTER — Emergency Department (HOSPITAL_BASED_OUTPATIENT_CLINIC_OR_DEPARTMENT_OTHER)
Admission: EM | Admit: 2020-04-05 | Discharge: 2020-04-05 | Disposition: A | Discharge status: Home / Self Care | Payer: Medicaid Other | Attending: Emergency Medicine | Admitting: Emergency Medicine

## 2020-04-05 ENCOUNTER — Encounter (HOSPITAL_BASED_OUTPATIENT_CLINIC_OR_DEPARTMENT_OTHER): Payer: Self-pay | Admitting: Emergency Medicine

## 2020-04-05 DIAGNOSIS — R0981 Nasal congestion: Secondary | ICD-10-CM | POA: Diagnosis present

## 2020-04-05 DIAGNOSIS — R5383 Other fatigue: Secondary | ICD-10-CM | POA: Diagnosis not present

## 2020-04-05 DIAGNOSIS — J3489 Other specified disorders of nose and nasal sinuses: Secondary | ICD-10-CM | POA: Insufficient documentation

## 2020-04-05 DIAGNOSIS — Z7951 Long term (current) use of inhaled steroids: Secondary | ICD-10-CM | POA: Insufficient documentation

## 2020-04-05 DIAGNOSIS — I10 Essential (primary) hypertension: Secondary | ICD-10-CM | POA: Insufficient documentation

## 2020-04-05 DIAGNOSIS — F17210 Nicotine dependence, cigarettes, uncomplicated: Secondary | ICD-10-CM | POA: Diagnosis not present

## 2020-04-05 DIAGNOSIS — J45909 Unspecified asthma, uncomplicated: Secondary | ICD-10-CM | POA: Insufficient documentation

## 2020-04-05 MED ORDER — FLUTICASONE PROPIONATE 50 MCG/ACT NA SUSP: 2.0000 | Freq: Every day | NASAL | 0 refills | Status: DC

## 2020-04-05 MED ORDER — IBUPROFEN 600 MG PO TABS: 600.0000 mg | ORAL_TABLET | Freq: Four times a day (QID) | ORAL | 0 refills | Status: DC | PRN

## 2020-04-05 NOTE — ED Triage Notes (Signed)
Nasal congestion and reports a swollen gland in throat x 3 days. She is fully vaccinated.

## 2020-04-05 NOTE — Discharge Instructions (Signed)
Please buy a Nettie pot which she can buy over-the-counter at [facility], [place], [facility] etc.  Use this at least twice daily in the mornings and evenings.  Please continue to take Zyrtec 10 mg every day.  Also please use the fluticasone that I prescribed you.  Patient plenty of water and alternate Tylenol ibuprofen as described below.  Please use Tylenol or ibuprofen for pain.  You may use 600 mg ibuprofen every 6 hours or 1000 mg of Tylenol every 6 hours.  You may choose to alternate between the 2.  This would be most effective.  Not to exceed 4 g of Tylenol within 24 hours.  Not to exceed 3200 mg ibuprofen 24 hours.

## 2020-04-05 NOTE — ED Provider Notes (Signed)
[facility] EMERGENCY DEPARTMENT Provider Note   CSN: [id number] Arrival date & time: 04/05/20  3846     History Chief Complaint  Patient presents with  . Nasal Congestion    Rachel Clay is a 29 y.o. female.  HPI  Patient is a 29 year old female with past medical history significant for asthma presented today with sinus congestion for 3 days.  She states that she also has a swollen lymph node in her neck.  She states that she has had no fevers, chills, nausea, abdominal pain, chest pain or shortness of breath.  She denies any cough.  She states that the congestion is yellow to clear.  She states that it is been frequent that she has had to blow her nose.  She states that she was tested for Covid 2 days ago which was negative.  She has been using Tylenol and no other medicines for her symptoms.  She states she is fully vaccinated against Covid has not had any recent exposure that she knows of.     Past Medical History:  Diagnosis Date  . Asthma    childhood    Patient Active Problem List   Diagnosis Date Noted  . 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 Insufficient prenatal care 05/02/2012    Past Surgical History:  Procedure Laterality Date  . NASAL SINUS SURGERY       OB History    Gravida  4   Para  2   Term  2   Preterm      AB      Living  2     SAB      TAB      Ectopic      Multiple      Live Births  2           Family History  Problem Relation Age of Onset  . Hypertension Mother   . Sleep apnea Daughter     Social History   Tobacco Use  . Smoking status: Current Every Day Smoker    Last attempt to quit: 06/20/2018    Years since quitting: 1.7  . Smokeless tobacco: Never Used  Vaping Use  . Vaping Use: Never used  Substance Use Topics  . Alcohol use: Yes  . Drug use: No    Home Medications Prior to Admission medications   Medication Sig Start Date End Date Taking? Authorizing Provider  albuterol  (PROVENTIL HFA;VENTOLIN HFA) 108 (90 Base) MCG/ACT inhaler Inhale 1-2 puffs into the lungs every 6 (six) hours as needed for wheezing or shortness of breath. 07/23/18   Rancour, Jeannett Senior, MD  azithromycin (ZITHROMAX Z-PAK) 250 MG tablet 500mg  PO once daily on day 1. THEN 250mg  PO once daily for 4 days 11/01/16   , PA-C  cyclobenzaprine (FLEXERIL) 10 MG tablet Take 1 tablet (10 mg total) by mouth 3 (three) times daily as needed for muscle spasms. 02/12/18   Audry Pili, MD  doxycycline (VIBRAMYCIN) 100 MG capsule Take 1 capsule (100 mg total) by mouth 2 (two) times daily. 07/23/18   Rancour, Terrilee Files, MD  fluticasone (FLONASE) 50 MCG/ACT nasal spray Place 2 sprays into both nostrils daily for 14 days. 04/05/20 04/19/20  04/07/20, PA  ibuprofen (ADVIL) 600 MG tablet Take 1 tablet (600 mg total) by mouth every 6 (six) hours as needed. 04/05/20   Gailen Shelter, PA  Pseudoephedrine-Guaifenesin (MUCINEX D PO) Take by mouth.    [provider]  Allergies    Patient has no known allergies.  Review of Systems   Review of Systems  Constitutional: Positive for fatigue. Negative for chills and fever.  HENT: Positive for congestion.   Eyes: Negative for pain.  Respiratory: Negative for cough and shortness of breath.   Cardiovascular: Negative for chest pain and leg swelling.  Gastrointestinal: Negative for abdominal pain and vomiting.  Genitourinary: Negative for dysuria.  Musculoskeletal: Negative for myalgias.  Skin: Negative for rash.  Neurological: Negative for dizziness and headaches.    Physical Exam Updated Vital Signs BP (!) 157/98 (BP Location: Right Arm)   Pulse (!) 101   Temp 98.6 F (37 C) (Oral)   Resp 18   Ht 5\' 7"  (1.702 m)   Wt 113.4 kg   LMP 03/17/2020   SpO2 98%   BMI 39.16 kg/m   Physical Exam Vitals and nursing note reviewed.  Constitutional:      General: She is not in acute distress. HENT:     Head: Normocephalic and atraumatic.      Comments: No tenderness to palpation of the maxillary or frontal sinuses.    Right Ear: External ear normal.     Left Ear: External ear normal.     Nose: Congestion and rhinorrhea present.     Mouth/Throat:     Mouth: Mucous membranes are moist.     Pharynx: No oropharyngeal exudate or posterior oropharyngeal erythema.  Eyes:     General: No scleral icterus.       Right eye: No discharge.        Left eye: No discharge.     Conjunctiva/sclera: Conjunctivae normal.  Neck:     Comments: Generalized cervical lymphadenopathy. Cardiovascular:     Rate and Rhythm: Normal rate and regular rhythm.  Pulmonary:     Effort: Pulmonary effort is normal.     Breath sounds: Normal breath sounds.  Abdominal:     Palpations: Abdomen is soft.     Tenderness: There is no abdominal tenderness.  Lymphadenopathy:     Cervical: No cervical adenopathy.  Skin:    General: Skin is warm and dry.  Neurological:     Mental Status: She is alert. Mental status is at baseline.  Psychiatric:        Mood and Affect: Mood normal.        Behavior: Behavior normal.     ED Results / Procedures / Treatments   Labs (all labs ordered are listed, but only abnormal results are displayed) Labs Reviewed - No data to display  EKG None  Radiology No results found.  Procedures Procedures (including critical care time)  Medications Ordered in ED Medications - No data to display  ED Course  I have reviewed the triage vital signs and the nursing notes.  Pertinent labs & imaging results that were available during my care of the patient were reviewed by me and considered in my medical decision making (see chart for details).    MDM Rules/Calculators/A&P                          Patient is 30 year old female with no pertinent past medical history apart from asthma presented today with congestion for 3 days.  No significant other associated symptoms.  She is otherwise feeling well.  She does have some  lymphadenopathy in her neck.  No splenomegaly palpated.  No abdominal tenderness.  She is well-appearing.  Low suspicion for mononucleosis but did  give her not on contact precautions and no contact sports.  Vital signs are within normal limits apart from very mild hypertension.  She is not tachycardic on examination.  Conservative therapy at this time.  Will treat with Flonase and ibuprofen as well as the Tylenol the patient is using at home.  Will drink plenty water and use a Nettie pot.   Final Clinical Impression(s) / ED Diagnoses Final diagnoses:  Sinus congestion    Rx / DC Orders ED Discharge Orders         Ordered    fluticasone (FLONASE) 50 MCG/ACT nasal spray  Daily        04/05/20 1019    ibuprofen (ADVIL) 600 MG tablet  Every 6 hours PRN        04/05/20 1019           Gailen Shelter, [place] 04/05/20 1024    [place], DO 04/05/20 1029

## 2020-04-16 ENCOUNTER — Telehealth: Payer: Medicaid Other | Admitting: Physician Assistant

## 2020-04-16 DIAGNOSIS — K047 Periapical abscess without sinus: Secondary | ICD-10-CM

## 2020-04-16 DIAGNOSIS — K0889 Other specified disorders of teeth and supporting structures: Secondary | ICD-10-CM | POA: Diagnosis not present

## 2020-04-16 MED ORDER — PENICILLIN V POTASSIUM 500 MG PO TABS: 500.0000 mg | ORAL_TABLET | Freq: Three times a day (TID) | ORAL | 0 refills | Status: DC

## 2020-04-16 MED ORDER — IBUPROFEN 600 MG PO TABS: 600.0000 mg | ORAL_TABLET | Freq: Three times a day (TID) | ORAL | 0 refills | Status: DC | PRN

## 2020-04-16 NOTE — Progress Notes (Signed)
E-Visit for Dental Pain  We are sorry that you are not feeling well.  Here is how we plan to help!  Based on what you have shared with me in the questionnaire, it sounds like you have dental pain secondary to possible infection of your tooth.   Ibuprofen 600mg  3 times a day for 7 days for discomfort and PCN 500mg  3 times per day for 10 days  It is imperative that you see a dentist within 10 days of this eVisit to determine the cause of the dental pain and be sure it is adequately treated  A toothache or tooth pain is caused when the nerve in the root of a tooth or surrounding a tooth is irritated. Dental (tooth) infection, decay, injury, or loss of a tooth are the most common causes of dental pain. Pain may also occur after an extraction (tooth is pulled out). Pain sometimes originates from other areas and radiates to the jaw, thus appearing to be tooth pain.Bacteria growing inside your mouth can contribute to gum disease and dental decay, both of which can cause pain. A toothache occurs from inflammation of the central portion of the tooth called pulp. The pulp contains nerve endings that are very sensitive to pain. Inflammation to the pulp or pulpitis may be caused by dental cavities, trauma, and infection.    HOME CARE:   For toothaches: . Over-the-counter pain medications such as acetaminophen or ibuprofen may be used. Take these as directed on the package while you arrange for a dental appointment. . Avoid very cold or hot foods, because they may make the pain worse. . You may get relief from biting on a cotton ball soaked in oil of cloves. You can get oil of cloves at most drug stores.  For jaw pain: .  Aspirin may be helpful for problems in the joint of the jaw in adults. . If pain happens every time you open your mouth widely, the temporomandibular joint (TMJ) may be the source of the pain. Yawning or taking a large bite of food may worsen the pain. An appointment with your doctor or  dentist will help you find the cause.     GET HELP RIGHT AWAY IF:  . You have a high fever or chills . If you have had a recent head or face injury and develop headache, light headedness, nausea, vomiting, or other symptoms that concern you after an injury to your face or mouth, you could have a more serious injury in addition to your dental injury. . A facial rash associated with a toothache: This condition may improve with medication. Contact your doctor for them to decide what is appropriate. . Any jaw pain occurring with chest pain: Although jaw pain is most commonly caused by dental disease, it is sometimes referred pain from other areas. People with heart disease, especially people who have had stents placed, people with diabetes, or those who have had heart surgery may have jaw pain as a symptom of heart attack or angina. If your jaw or tooth pain is associated with lightheadedness, sweating, or shortness of breath, you should see a doctor as soon as possible. . Trouble swallowing or excessive pain or bleeding from gums: If you have a history of a weakened immune system, diabetes, or steroid use, you may be more susceptible to infections. Infections can often be more severe and extensive or caused by unusual organisms. Dental and gum infections in people with these conditions may require more aggressive treatment. An  abscess may need draining or IV antibiotics, for example.  MAKE SURE YOU    Understand these instructions.  Will watch your condition.  Will get help right away if you are not doing well or get worse.  Thank you for choosing an e-visit. Your e-visit answers were reviewed by a board certified advanced clinical practitioner to complete your personal care plan. Depending upon the condition, your plan could have included both over the counter or prescription medications. Please review your pharmacy choice. Make sure the pharmacy is open so you can pick up prescription now. If  there is a problem, you may contact your provider through [employer] and have the prescription routed to another pharmacy. Your safety is important to [place]. If you have drug allergies check your prescription carefully.  For the next 24 hours you can use MyChart to ask questions about today's visit, request a non-urgent call back, or ask for a work or school excuse. You will get an email in the next two days asking about your experience. I hope that your e-visit has been valuable and will speed your recovery.  Greater than 5 minutes, yet less than 10 minutes of time have been spent researching, coordinating and implementing care for this patient today.

## 2020-04-20 ENCOUNTER — Telehealth: Payer: Medicaid Other | Admitting: Physician Assistant

## 2020-04-20 DIAGNOSIS — K0889 Other specified disorders of teeth and supporting structures: Secondary | ICD-10-CM

## 2020-04-20 MED ORDER — CLINDAMYCIN HCL 300 MG PO CAPS: 300.0000 mg | ORAL_CAPSULE | Freq: Four times a day (QID) | ORAL | 0 refills | Status: DC

## 2020-04-20 NOTE — Progress Notes (Signed)
E-Visit for Dental Pain  We are sorry that you are not feeling well.  Here is how we plan to help!  You can try to switch antibiotics and try this one instead. I have sent in the following prescription.:  Clindamycin 300mg  4 times per day for days (7)  It is imperative that you see a dentist within 10 days of this eVisit to determine the cause of the dental pain and be sure it is adequately treated  A toothache or tooth pain is caused when the nerve in the root of a tooth or surrounding a tooth is irritated. Dental (tooth) infection, decay, injury, or loss of a tooth are the most common causes of dental pain. Pain may also occur after an extraction (tooth is pulled out). Pain sometimes originates from other areas and radiates to the jaw, thus appearing to be tooth pain.Bacteria growing inside your mouth can contribute to gum disease and dental decay, both of which can cause pain. A toothache occurs from inflammation of the central portion of the tooth called pulp. The pulp contains nerve endings that are very sensitive to pain. Inflammation to the pulp or pulpitis may be caused by dental cavities, trauma, and infection.    HOME CARE:   For toothaches: . Over-the-counter pain medications such as acetaminophen or ibuprofen may be used. Take these as directed on the package while you arrange for a dental appointment. . Avoid very cold or hot foods, because they may make the pain worse. . You may get relief from biting on a cotton ball soaked in oil of cloves. You can get oil of cloves at most drug stores.  For jaw pain: .  Aspirin may be helpful for problems in the joint of the jaw in adults. . If pain happens every time you open your mouth widely, the temporomandibular joint (TMJ) may be the source of the pain. Yawning or taking a large bite of food may worsen the pain. An appointment with your doctor or dentist will help you find the cause.     GET HELP RIGHT AWAY IF:  . You have a high  fever or chills . If you have had a recent head or face injury and develop headache, light headedness, nausea, vomiting, or other symptoms that concern you after an injury to your face or mouth, you could have a more serious injury in addition to your dental injury. . A facial rash associated with a toothache: This condition may improve with medication. Contact your doctor for them to decide what is appropriate. . Any jaw pain occurring with chest pain: Although jaw pain is most commonly caused by dental disease, it is sometimes referred pain from other areas. People with heart disease, especially people who have had stents placed, people with diabetes, or those who have had heart surgery may have jaw pain as a symptom of heart attack or angina. If your jaw or tooth pain is associated with lightheadedness, sweating, or shortness of breath, you should see a doctor as soon as possible. . Trouble swallowing or excessive pain or bleeding from gums: If you have a history of a weakened immune system, diabetes, or steroid use, you may be more susceptible to infections. Infections can often be more severe and extensive or caused by unusual organisms. Dental and gum infections in people with these conditions may require more aggressive treatment. An abscess may need draining or IV antibiotics, for example.  MAKE SURE YOU    Understand these instructions.  Will watch your condition.  Will get help right away if you are not doing well or get worse.  Thank you for choosing an e-visit. Your e-visit answers were reviewed by a board certified advanced clinical practitioner to complete your personal care plan. Depending upon the condition, your plan could have included both over the counter or prescription medications. Please review your pharmacy choice. Make sure the pharmacy is open so you can pick up prescription now. If there is a problem, you may contact your provider through [employer] and have the  prescription routed to another pharmacy. Your safety is important to [place]. If you have drug allergies check your prescription carefully.  For the next 24 hours you can use MyChart to ask questions about today's visit, request a non-urgent call back, or ask for a work or school excuse. You will get an email in the next two days asking about your experience. I hope that your e-visit has been valuable and will speed your recovery.  Approximately 5 minutes was spent documenting and reviewing patient's chart.

## 2020-04-23 ENCOUNTER — Telehealth: Payer: Medicaid Other | Admitting: Family

## 2020-04-23 DIAGNOSIS — K0889 Other specified disorders of teeth and supporting structures: Secondary | ICD-10-CM

## 2020-04-23 MED ORDER — IBUPROFEN 600 MG PO TABS: 600.0000 mg | ORAL_TABLET | Freq: Three times a day (TID) | ORAL | 0 refills | Status: DC | PRN

## 2020-04-23 MED ORDER — CLINDAMYCIN HCL 300 MG PO CAPS: 300.0000 mg | ORAL_CAPSULE | Freq: Four times a day (QID) | ORAL | 0 refills | Status: AC

## 2020-04-23 NOTE — Progress Notes (Signed)
E-Visit for Dental Pain  We are sorry that you are not feeling well.  Here is how we plan to help!  Based on what you have shared with me in the questionnaire, it sounds like you have chronic dental issues.    I will extend the antibiotics that you were given on Monday by 3 more days. I will also update your prescription for Ibuprofen. Please call your dentist and see if you can get an appointment as soon as possible.   It is imperative that you see a dentist within AS SOON AS POSSIBLE to determine the cause of the dental pain and be sure it is adequately treated. This is the 3rd e-visit you have done for your mouth in the past 7 days. Please call your dentist and see if they can schedule you before October 25.    A toothache or tooth pain is caused when the nerve in the root of a tooth or surrounding a tooth is irritated. Dental (tooth) infection, decay, injury, or loss of a tooth are the most common causes of dental pain. Pain may also occur after an extraction (tooth is pulled out). Pain sometimes originates from other areas and radiates to the jaw, thus appearing to be tooth pain.Bacteria growing inside your mouth can contribute to gum disease and dental decay, both of which can cause pain. A toothache occurs from inflammation of the central portion of the tooth called pulp. The pulp contains nerve endings that are very sensitive to pain. Inflammation to the pulp or pulpitis may be caused by dental cavities, trauma, and infection.    HOME CARE:   For toothaches: . Over-the-counter pain medications such as acetaminophen or ibuprofen may be used. Take these as directed on the package while you arrange for a dental appointment. . Avoid very cold or hot foods, because they may make the pain worse. . You may get relief from biting on a cotton ball soaked in oil of cloves. You can get oil of cloves at most drug stores.  For jaw pain: .  Aspirin may be helpful for problems in the joint of the jaw  in adults. . If pain happens every time you open your mouth widely, the temporomandibular joint (TMJ) may be the source of the pain. Yawning or taking a large bite of food may worsen the pain. An appointment with your doctor or dentist will help you find the cause.     GET HELP RIGHT AWAY IF:  . You have a high fever or chills . If you have had a recent head or face injury and develop headache, light headedness, nausea, vomiting, or other symptoms that concern you after an injury to your face or mouth, you could have a more serious injury in addition to your dental injury. . A facial rash associated with a toothache: This condition may improve with medication. Contact your doctor for them to decide what is appropriate. . Any jaw pain occurring with chest pain: Although jaw pain is most commonly caused by dental disease, it is sometimes referred pain from other areas. People with heart disease, especially people who have had stents placed, people with diabetes, or those who have had heart surgery may have jaw pain as a symptom of heart attack or angina. If your jaw or tooth pain is associated with lightheadedness, sweating, or shortness of breath, you should see a doctor as soon as possible. . Trouble swallowing or excessive pain or bleeding from gums: If you have a  history of a weakened immune system, diabetes, or steroid use, you may be more susceptible to infections. Infections can often be more severe and extensive or caused by unusual organisms. Dental and gum infections in people with these conditions may require more aggressive treatment. An abscess may need draining or IV antibiotics, for example.  MAKE SURE YOU    Understand these instructions.  Will watch your condition.  Will get help right away if you are not doing well or get worse.  Thank you for choosing an e-visit. Your e-visit answers were reviewed by a board certified advanced clinical practitioner to complete your personal  care plan. Depending upon the condition, your plan could have included both over the counter or prescription medications. Please review your pharmacy choice. Make sure the pharmacy is open so you can pick up prescription now. If there is a problem, you may contact your provider through [employer] and have the prescription routed to another pharmacy. Your safety is important to [place]. If you have drug allergies check your prescription carefully.  For the next 24 hours you can use MyChart to ask questions about today's visit, request a non-urgent call back, or ask for a work or school excuse. You will get an email in the next two days asking about your experience. I hope that your e-visit has been valuable and will speed your recovery.  Greater than 5 minutes, yet less than 10 minutes of time have been spent researching, coordinating, and implementing care for this patient.

## 2020-05-01 ENCOUNTER — Other Ambulatory Visit: Payer: Self-pay | Admitting: Family

## 2020-05-10 ENCOUNTER — Other Ambulatory Visit: Payer: Self-pay

## 2020-05-10 ENCOUNTER — Encounter (HOSPITAL_BASED_OUTPATIENT_CLINIC_OR_DEPARTMENT_OTHER): Payer: Self-pay | Admitting: Emergency Medicine

## 2020-05-10 ENCOUNTER — Emergency Department (HOSPITAL_BASED_OUTPATIENT_CLINIC_OR_DEPARTMENT_OTHER)
Admission: EM | Admit: 2020-05-10 | Discharge: 2020-05-11 | Disposition: A | Discharge status: Home / Self Care | Payer: Medicaid Other | Attending: Emergency Medicine | Admitting: Emergency Medicine

## 2020-05-10 DIAGNOSIS — F17200 Nicotine dependence, unspecified, uncomplicated: Secondary | ICD-10-CM | POA: Diagnosis not present

## 2020-05-10 DIAGNOSIS — J4521 Mild intermittent asthma with (acute) exacerbation: Secondary | ICD-10-CM | POA: Insufficient documentation

## 2020-05-10 DIAGNOSIS — R0981 Nasal congestion: Secondary | ICD-10-CM | POA: Diagnosis present

## 2020-05-10 MED ORDER — ALBUTEROL SULFATE HFA 108 (90 BASE) MCG/ACT IN AERS
2.0000 | INHALATION_SPRAY | Freq: Once | RESPIRATORY_TRACT | Status: AC
  Administered 2020-05-11: 2 via RESPIRATORY_TRACT
  Filled 2020-05-10: qty 6.7

## 2020-05-10 MED ORDER — DEXAMETHASONE 6 MG PO TABS
10.0000 mg | ORAL_TABLET | Freq: Once | ORAL | Status: AC
  Administered 2020-05-10: 10 mg via ORAL
  Filled 2020-05-10: qty 1

## 2020-05-10 NOTE — ED Triage Notes (Signed)
Reports hx congestion for two days, states hx of asthma and still smoking reports smoking is making her symptoms worse, states she feels like she needs a treatment.

## 2020-05-11 ENCOUNTER — Emergency Department (HOSPITAL_BASED_OUTPATIENT_CLINIC_OR_DEPARTMENT_OTHER): Payer: Medicaid Other

## 2020-05-11 MED ORDER — ALBUTEROL SULFATE HFA 108 (90 BASE) MCG/ACT IN AERS: 1.0000 | INHALATION_SPRAY | Freq: Four times a day (QID) | RESPIRATORY_TRACT | 0 refills | Status: DC | PRN

## 2020-05-11 NOTE — Discharge Instructions (Addendum)
You were seen today for shortness of breath.  Your chest x-ray does not show any evidence of infection or pneumonia.  This is likely a mild asthma exacerbation.  You should avoid smoking and other triggers.

## 2020-05-11 NOTE — ED Provider Notes (Signed)
[facility] EMERGENCY DEPARTMENT Provider Note   CSN: [id number] Arrival date & time: 05/10/20  2320     History Chief Complaint  Patient presents with  . Nasal Congestion  . Asthma    Rachel Clay is a 29 y.o. female.  HPI     This is a 29 year old female with a history of asthma who presents with congestion and shortness of breath.  Patient reports over the last 2 days she has had increased congestion and shortness of breath.  She states that her shortness of breath worsened after smoking.  She states that she tries not to smoke much but feels that this triggered potentially her asthma.  She has not used an inhaler in over 2 years and does not have one at home.  She not had any fevers.  No known sick contacts or Covid exposures.  No loss of sense of taste or smell and she is fully vaccinated.  Denies chest pain, lower extremity swelling, history of blood clots.  Past Medical History:  Diagnosis Date  . Asthma    childhood    Patient Active Problem List   Diagnosis Date Noted  . 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 Insufficient prenatal care 05/02/2012    Past Surgical History:  Procedure Laterality Date  . NASAL SINUS SURGERY       OB History    Gravida  4   Para  2   Term  2   Preterm      AB      Living  2     SAB      TAB      Ectopic      Multiple      Live Births  2           Family History  Problem Relation Age of Onset  . Hypertension Mother   . Sleep apnea Daughter     Social History   Tobacco Use  . Smoking status: Current Every Day Smoker    Last attempt to quit: 06/20/2018    Years since quitting: 1.8  . Smokeless tobacco: Never Used  Vaping Use  . Vaping Use: Never used  Substance Use Topics  . Alcohol use: Yes  . Drug use: No    Home Medications Prior to Admission medications   Medication Sig Start Date End Date Taking? Authorizing Provider  albuterol (VENTOLIN HFA) 108 (90 Base) MCG/ACT  inhaler Inhale 1-2 puffs into the lungs every 6 (six) hours as needed for wheezing or shortness of breath. 05/11/20   Richar Dunklee, Mayer Masker, MD  cyclobenzaprine (FLEXERIL) 10 MG tablet Take 1 tablet (10 mg total) by mouth 3 (three) times daily as needed for muscle spasms. 02/12/18   Terrilee Files, MD  fluticasone (FLONASE) 50 MCG/ACT nasal spray Place 2 sprays into both nostrils daily for 14 days. 04/05/20 04/19/20  Gailen Shelter, PA  ibuprofen (ADVIL) 600 MG tablet Take 1 tablet (600 mg total) by mouth every 6 (six) hours as needed. 04/05/20   Gailen Shelter, PA  ibuprofen (ADVIL) 600 MG tablet Take 1 tablet (600 mg total) by mouth every 8 (eight) hours as needed. 04/23/20   Olive Bass, FNP  Pseudoephedrine-Guaifenesin [facility] D PO) Take by mouth.    [provider]    Allergies    Patient has no known allergies.  Review of Systems   Review of Systems  Constitutional: Negative for fever.  HENT: Positive for congestion.  Respiratory: Positive for cough and shortness of breath.   Cardiovascular: Negative for chest pain and leg swelling.  Gastrointestinal: Negative for abdominal pain, nausea and vomiting.  All other systems reviewed and are negative.   Physical Exam Updated Vital Signs BP (!) 139/92 (BP Location: Left Arm)   Pulse 92   Temp 98.5 F (36.9 C) (Oral)   Resp 18   Wt 121.2 kg   LMP 05/03/2020 (Approximate)   SpO2 100%   BMI 41.87 kg/m   Physical Exam Vitals and nursing note reviewed.  Constitutional:      Appearance: She is well-developed. She is obese. She is not ill-appearing.  HENT:     Head: Normocephalic and atraumatic.     Nose: Nose normal.     Mouth/Throat:     Mouth: Mucous membranes are moist.  Eyes:     Pupils: Pupils are equal, round, and reactive to light.  Cardiovascular:     Rate and Rhythm: Normal rate and regular rhythm.     Heart sounds: Normal heart sounds.  Pulmonary:     Effort: Pulmonary effort is normal. No  respiratory distress.     Breath sounds: No wheezing.     Comments: Diminished breath sounds in all lung fields, no audible wheezing noted Abdominal:     Palpations: Abdomen is soft.     Tenderness: There is no abdominal tenderness.  Musculoskeletal:     Cervical back: Neck supple.     Right lower leg: No edema.     Left lower leg: No edema.  Skin:    General: Skin is warm and dry.  Neurological:     Mental Status: She is alert and oriented to person, place, and time.  Psychiatric:        Mood and Affect: Mood normal.     ED Results / Procedures / Treatments   Labs (all labs ordered are listed, but only abnormal results are displayed) Labs Reviewed - No data to display  EKG None  Radiology DG Chest 2 View  Result Date: 05/11/2020 CLINICAL DATA:  Shortness of breath and congestion for 2 days. EXAM: CHEST - 2 VIEW COMPARISON:  07/23/2018 FINDINGS: The cardiomediastinal contours are normal. Again seen mild central bronchial thickening. Previous streaky left lung base opacity has improved. Pulmonary vasculature is normal. No consolidation, pleural effusion, or pneumothorax. No acute osseous abnormalities are seen. IMPRESSION: Mild central bronchial thickening suggesting asthma or bronchitis. Electronically Signed   By: Narda Rutherford M.D.   On: 05/11/2020 00:49    Procedures Procedures (including critical care time)  Medications Ordered in ED Medications  albuterol (VENTOLIN HFA) 108 (90 Base) MCG/ACT inhaler 2 puff (2 puffs Inhalation Given 05/11/20 0000)  dexamethasone (DECADRON) tablet 10 mg (10 mg Oral Given 05/10/20 2358)    ED Course  I have reviewed the triage vital signs and the nursing notes.  Pertinent labs & imaging results that were available during my care of the patient were reviewed by me and considered in my medical decision making (see chart for details).    MDM Rules/Calculators/A&P                          Patient presents with shortness of breath  and congestion.  History of asthma.  She is nontoxic on exam and vital signs are reassuring.  She is afebrile.  She has no audible wheezing but she has diminished breath sounds in all lung fields.  She was given  an inhaler and Decadron given her history of asthma.  She also had a trigger of smoking.  She is fully vaccinated against COVID-19 and has no other infectious symptoms.  We'll hold off on testing at this time.  I did obtain a chest x-ray given that she has no significant wheezing on exam to rule out pneumonia or other etiology.  Chest x-ray independently reviewed by myself and without evidence of pneumonia or pneumothorax.  Is consistent with bronchial thickening and asthma.  Patient has remained hemodynamically stable and satting 100% on room air with no respiratory distress.  Will discharge home with an inhaler for symptom control.  After history, exam, and medical workup I feel the patient has been appropriately medically screened and is safe for discharge home. Pertinent diagnoses were discussed with the patient. Patient was given return precautions.  Final Clinical Impression(s) / ED Diagnoses Final diagnoses:  Mild intermittent asthma with acute exacerbation    Rx / DC Orders ED Discharge Orders         Ordered    albuterol (VENTOLIN HFA) 108 (90 Base) MCG/ACT inhaler  Every 6 hours PRN        05/11/20 0107           Shon Baton, MD 05/11/20 [phone]

## 2020-05-14 ENCOUNTER — Emergency Department (HOSPITAL_BASED_OUTPATIENT_CLINIC_OR_DEPARTMENT_OTHER)
Admission: EM | Admit: 2020-05-14 | Discharge: 2020-05-14 | Disposition: A | Discharge status: Home / Self Care | Payer: Medicaid Other | Attending: Emergency Medicine | Admitting: Emergency Medicine

## 2020-05-14 ENCOUNTER — Other Ambulatory Visit: Payer: Self-pay

## 2020-05-14 ENCOUNTER — Encounter (HOSPITAL_BASED_OUTPATIENT_CLINIC_OR_DEPARTMENT_OTHER): Payer: Self-pay | Admitting: *Deleted

## 2020-05-14 ENCOUNTER — Telehealth: Payer: Medicaid Other | Admitting: Physician Assistant

## 2020-05-14 DIAGNOSIS — R5383 Other fatigue: Secondary | ICD-10-CM | POA: Diagnosis not present

## 2020-05-14 DIAGNOSIS — J3489 Other specified disorders of nose and nasal sinuses: Secondary | ICD-10-CM | POA: Insufficient documentation

## 2020-05-14 DIAGNOSIS — Z7951 Long term (current) use of inhaled steroids: Secondary | ICD-10-CM | POA: Insufficient documentation

## 2020-05-14 DIAGNOSIS — Z20822 Contact with and (suspected) exposure to covid-19: Secondary | ICD-10-CM | POA: Diagnosis not present

## 2020-05-14 DIAGNOSIS — R0981 Nasal congestion: Secondary | ICD-10-CM | POA: Insufficient documentation

## 2020-05-14 DIAGNOSIS — F17200 Nicotine dependence, unspecified, uncomplicated: Secondary | ICD-10-CM | POA: Insufficient documentation

## 2020-05-14 DIAGNOSIS — J45909 Unspecified asthma, uncomplicated: Secondary | ICD-10-CM | POA: Insufficient documentation

## 2020-05-14 DIAGNOSIS — R059 Cough, unspecified: Secondary | ICD-10-CM

## 2020-05-14 LAB — RESP PANEL BY RT PCR (RSV, FLU A&B, COVID)
Influenza A by PCR: NEGATIVE
Influenza B by PCR: NEGATIVE
Respiratory Syncytial Virus by PCR: NEGATIVE
SARS Coronavirus 2 by RT PCR: NEGATIVE

## 2020-05-14 NOTE — Discharge Instructions (Signed)
Start claritin or zyrtec and flonase.

## 2020-05-14 NOTE — ED Provider Notes (Signed)
[facility] EMERGENCY DEPARTMENT Provider Note   CSN: [id number] Arrival date & time: 05/14/20  2249     History Chief Complaint  Patient presents with  . Covid Symptoms    Rachel Clay is a 29 y.o. female.  The history is provided by the patient.  URI Presenting symptoms: congestion, fatigue and rhinorrhea   Presenting symptoms: no fever   Severity:  Moderate Onset quality:  Gradual Duration:  1 week Timing:  Constant Progression:  Unchanged Chronicity:  New Relieved by:  Nothing Worsened by:  Nothing Ineffective treatments:  None tried Associated symptoms: no neck pain and no wheezing   Risk factors: no immunosuppression   Patient with a week of covid symptoms.  N CP.  No DOE.  No cough.       Past Medical History:  Diagnosis Date  . Asthma    childhood    Patient Active Problem List   Diagnosis Date Noted  . 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 Insufficient prenatal care 05/02/2012    Past Surgical History:  Procedure Laterality Date  . NASAL SINUS SURGERY       OB History    Gravida  4   Para  2   Term  2   Preterm      AB      Living  2     SAB      TAB      Ectopic      Multiple      Live Births  2           Family History  Problem Relation Age of Onset  . Hypertension Mother   . Sleep apnea Daughter     Social History   Tobacco Use  . Smoking status: Current Every Day Smoker    Last attempt to quit: 06/20/2018    Years since quitting: 1.9  . Smokeless tobacco: Never Used  Vaping Use  . Vaping Use: Never used  Substance Use Topics  . Alcohol use: Yes  . Drug use: No    Home Medications Prior to Admission medications   Medication Sig Start Date End Date Taking? Authorizing Provider  albuterol (VENTOLIN HFA) 108 (90 Base) MCG/ACT inhaler Inhale 1-2 puffs into the lungs every 6 (six) hours as needed for wheezing or shortness of breath. 05/11/20   Horton, Mayer Masker, MD  cyclobenzaprine  (FLEXERIL) 10 MG tablet Take 1 tablet (10 mg total) by mouth 3 (three) times daily as needed for muscle spasms. 02/12/18   Terrilee Files, MD  fluticasone (FLONASE) 50 MCG/ACT nasal spray Place 2 sprays into both nostrils daily for 14 days. 04/05/20 04/19/20  Gailen Shelter, PA  ibuprofen (ADVIL) 600 MG tablet Take 1 tablet (600 mg total) by mouth every 6 (six) hours as needed. 04/05/20   Gailen Shelter, PA  ibuprofen (ADVIL) 600 MG tablet Take 1 tablet (600 mg total) by mouth every 8 (eight) hours as needed. 04/23/20   Olive Bass, FNP  Pseudoephedrine-Guaifenesin [facility] D PO) Take by mouth.    [provider]    Allergies    Patient has no known allergies.  Review of Systems   Review of Systems  Constitutional: Positive for fatigue. Negative for fever.  HENT: Positive for congestion and rhinorrhea.   Eyes: Negative for visual disturbance.  Respiratory: Negative for wheezing.   Cardiovascular: Negative for chest pain, palpitations and leg swelling.  Gastrointestinal: Negative for abdominal pain.  Genitourinary: Negative for  difficulty urinating.  Musculoskeletal: Negative for neck pain.  Skin: Negative for rash.  Neurological: Negative for dizziness.  Psychiatric/Behavioral: Negative for agitation.  All other systems reviewed and are negative.   Physical Exam Updated Vital Signs BP (!) 128/100   Pulse 89   Temp 98.8 F (37.1 C) (Oral)   Resp 18   Ht 5\' 7"  (1.702 m)   Wt 118.6 kg   LMP 05/03/2020 (Approximate)   SpO2 100%   BMI 40.95 kg/m   Physical Exam Vitals and nursing note reviewed.  Constitutional:      General: She is not in acute distress.    Appearance: Normal appearance.  HENT:     Head: Normocephalic and atraumatic.     Nose: Nose normal.  Eyes:     Conjunctiva/sclera: Conjunctivae normal.     Pupils: Pupils are equal, round, and reactive to light.  Cardiovascular:     Rate and Rhythm: Normal rate and regular rhythm.     Pulses:  Normal pulses.     Heart sounds: Normal heart sounds.  Pulmonary:     Effort: Pulmonary effort is normal.     Breath sounds: Normal breath sounds.  Abdominal:     General: Abdomen is flat. Bowel sounds are normal.     Palpations: Abdomen is soft.     Tenderness: There is no abdominal tenderness. There is no guarding.  Musculoskeletal:        General: Normal range of motion.     Cervical back: Normal range of motion and neck supple.  Skin:    General: Skin is warm and dry.     Capillary Refill: Capillary refill takes less than 2 seconds.  Neurological:     General: No focal deficit present.     Mental Status: She is alert and oriented to person, place, and time.     Deep Tendon Reflexes: Reflexes normal.  Psychiatric:        Mood and Affect: Mood normal.        Behavior: Behavior normal.     ED Results / Procedures / Treatments   Labs (all labs ordered are listed, but only abnormal results are displayed) Labs Reviewed  RESP PANEL BY RT PCR (RSV, FLU A&B, COVID)    EKG None  Radiology No results found.  Procedures Procedures (including critical care time)  Medications Ordered in ED Medications - No data to display  ED Course  I have reviewed the triage vital signs and the nursing notes.  Pertinent labs & imaging results that were available during my care of the patient were reviewed by me and considered in my medical decision making (see chart for details).    PERC negative wells 0 highly doubt PE in this low risk patient.  Symptoms consistent  With hay fever.  Start claritin or zyrtec.  Follow up with your PMD.  Vitals and exam are normal.   Second covid swab sent.  Rachel Clay was evaluated in Emergency Department on 05/14/2020 for the symptoms described in the history of present illness. She was evaluated in the context of the global COVID-19 pandemic, which necessitated consideration that the patient might be at risk for infection with the SARS-CoV-2 virus that  causes COVID-19. Institutional protocols and algorithms that pertain to the evaluation of patients at risk for COVID-19 are in a state of rapid change based on information released by regulatory bodies including the CDC and federal and state organizations. These policies and algorithms were followed during the patient's  care in the ED.  Final Clinical Impression(s) / ED Diagnoses Return for intractable cough, coughing up blood,fevers >100.4 unrelieved by medication, shortness of breath, intractable vomiting, chest pain, shortness of breath, weakness,numbness, changes in speech, facial asymmetry,abdominal pain, passing out,Inability to tolerate liquids or food, cough, altered mental status or any concerns. No signs of systemic illness or infection. The patient is nontoxic-appearing on exam and vital signs are within normal limits.   I have reviewed the triage vital signs and the nursing notes. Pertinent labs &imaging results that were available during my care of the patient were reviewed by me and considered in my medical decision making (see chart for details).After history, exam, and medical workup I feel the patient has beenappropriately medically screened and is safe for discharge home. Pertinent diagnoses were discussed with the patient. Patient was given return precautions.   Early Steel, MD 05/14/20 2326

## 2020-05-14 NOTE — ED Triage Notes (Signed)
Fatigue and SOB x 4 days. Denies cough. No fever. She had a negative Covid test a week ago.

## 2020-05-14 NOTE — Progress Notes (Signed)
Based on what you shared with me, I feel your condition warrants further evaluation and I recommend that you be seen for a face to face office visit.   NOTE: If you entered your credit card information for this eVisit, you will not be charged. You may see a "hold" on your card for the $35 but that hold will drop off and you will not have a charge processed.   If you are having a true medical emergency please call 911.      For an urgent face to face visit, [place] has five urgent care centers for your convenience:     [facility] at [facility] Directions [phone] [address] [place], [place] [address] . 10 am - 6pm Monday - Friday    [facility] Urgent Care Center [facility]) Get Driving Directions [phone] [address] [place], [place] [address] . 10 am to 8 pm Monday-Friday . 12 pm to 8 pm [facility] Ur[facility] Care at [facility] Get Driving Directions [phone] [address] [place], [place] [address] . 8 am to 8 pm Monday-Friday . 9 am to 6 pm Saturday . 11 am to 6 pm Sunday     [facility] Health Urgent Care at [facility] Get Driving Directions  [phone] [address] [place], [place] [address] . 8 am to 8 pm Monday-Friday . 8 am to 4 pm [facility] Urgent Care at [facility] Directions [phone] [address] [place], [place] [address] . 12 pm to 6 pm Monday-Friday      Your e-visit answers were reviewed by a board certified advanced clinical practitioner to complete your personal care plan.  Thank you for using e-Visits.   Greater than 5 minutes, yet less than 10 minutes of time have been spent researching, coordinating, and implementing care for this patient today.  Jarold Motto PA-C

## 2020-05-18 ENCOUNTER — Telehealth: Payer: Medicaid Other | Admitting: Family

## 2020-05-18 DIAGNOSIS — J4521 Mild intermittent asthma with (acute) exacerbation: Secondary | ICD-10-CM | POA: Diagnosis not present

## 2020-05-18 MED ORDER — PREDNISONE 20 MG PO TABS: 40.0000 mg | ORAL_TABLET | Freq: Every day | ORAL | 0 refills | Status: AC

## 2020-05-18 MED ORDER — ALBUTEROL SULFATE HFA 108 (90 BASE) MCG/ACT IN AERS: 1.0000 | INHALATION_SPRAY | Freq: Four times a day (QID) | RESPIRATORY_TRACT | 0 refills | Status: DC | PRN

## 2020-05-18 NOTE — Progress Notes (Signed)
Visit for Asthma  Based on what you have shared with me, it looks like you may have a flare up of your asthma.  Asthma is a chronic (ongoing) lung disease which results in airway obstruction, inflammation and hyper-responsiveness.   Asthma symptoms vary from person to person, with common symptoms including nighttime awakening and decreased ability to participate in normal activities as a result of shortness of breath. It is often triggered by changes in weather, changes in the season, changes in air temperature, or inside (home, school, daycare or work) allergens such as animal dander, mold, mildew, woodstoves or cockroaches.   It can also be triggered by hormonal changes, extreme emotion, physical exertion or an upper respiratory tract illness.     It is important to identify the trigger, and then eliminate or avoid the trigger if possible.   If you have been prescribed medications to be taken on a regular basis, it is important to follow the asthma action plan and to follow guidelines to adjust medication in response to increasing symptoms of decreased peak expiratory flow rate  Treatment: I have prescribed: Albuterol (Proventil HFA; Ventolin HFA) 108 (90 Base) MCG/ACT Inhaler 2 puffs into the lungs every six hours as needed for wheezing or shortness of breath and Prednisone 40mg by mouth per day for  7 days  HOME CARE . Only take medications as instructed by your medical team. . Consider wearing a mask or scarf to improve breathing air temperature have been shown to decrease irritation and decrease exacerbations . Get rest. . Taking a steamy shower or using a humidifier may help nasal congestion sand ease sore throat pain. You can place a towel over your head and breathe in the steam from hot water coming from a faucet. . Using a saline nasal spray works much the same way.  . Cough  drops, hare candies and sore throat lozenges may ease your cough.  . Avoid close contacts especially the very you and the elderly . Cover your mouth if you cough or sneeze . Always remember to wash your hands.    GET HELP RIGHT AWAY IF: . You develop worsening symptoms; breathlessness at rest, drowsy, confused or agitated, unable to speak in full sentences . You have coughing fits . You develop a severe headache or visual changes . You develop shortness of breath, difficulty breathing or start having chest pain . Your symptoms persist after you have completed your treatment plan . If your symptoms do not improve within 10 days  MAKE SURE YOU . Understand these instructions. . Will watch your condition. . Will get help right away if you are not doing well or get worse.   Your e-visit answers were reviewed by a board certified advanced clinical practitioner to complete your personal care plan, Depending upon the condition, your plan could have included both over the counter or prescription medications.  Please review your pharmacy choice. Your safety is important to us. If you have drug allergies check your prescription carefully. You can use MyChart to ask questions about today's visit, request a non-urgent call back, or ask for a work or school excuse for 24 hours related to this e-Visit. If it has been greater than 24 hours you will need to follow up with your provider, or enter a new e-Visit to address those concerns.  You will get an e-mail in the next two days asking about your experience. I hope that your e-visit has been valuable and will speed your recovery. Thank   you for using e-visits.   Approximately 5 minutes was spent documenting and reviewing patient's chart.   

## 2020-06-26 ENCOUNTER — Telehealth: Payer: Medicaid Other | Admitting: Physician Assistant

## 2020-06-26 DIAGNOSIS — J329 Chronic sinusitis, unspecified: Secondary | ICD-10-CM

## 2020-06-26 DIAGNOSIS — B9789 Other viral agents as the cause of diseases classified elsewhere: Secondary | ICD-10-CM

## 2020-06-26 DIAGNOSIS — R0981 Nasal congestion: Secondary | ICD-10-CM

## 2020-06-26 MED ORDER — AZELASTINE HCL 0.1 % NA SOLN: 1.0000 | Freq: Two times a day (BID) | NASAL | 0 refills | Status: DC

## 2020-06-26 NOTE — Progress Notes (Signed)
We are sorry that you are not feeling well.  Here is how we plan to help!  Based on what you have shared with me it looks like you have sinusitis.  Sinusitis is inflammation and infection in the sinus cavities of the head.  Based on your presentation I believe you most likely have Acute Viral Sinusitis.This is an infection most likely caused by a virus.   I believe you would benefit from saline nasal rinse. You can get this at your pharmacy.  There is not specific treatment for viral sinusitis other than to help you with the symptoms until the infection runs its course.  You may use an oral decongestant such as Mucinex D or if you have glaucoma or high blood pressure use plain Mucinex. Saline nasal spray help and can safely be used as often as needed for congestion, I have prescribed: Azelastine nasal spray 2 sprays in each nostril twice a day  Some authorities believe that zinc sprays or the use of Echinacea may shorten the course of your symptoms.  Sinus infections are not as easily transmitted as other respiratory infection, however we still recommend that you avoid close contact with loved ones, especially the very young and elderly.  Remember to wash your hands thoroughly throughout the day as this is the number one way to prevent the spread of infection!  Home Care:  Only take medications as instructed by your medical team.  Do not take these medications with alcohol.  A steam or ultrasonic humidifier can help congestion.  You can place a towel over your head and breathe in the steam from hot water coming from a faucet.  Avoid close contacts especially the very young and the elderly.  Cover your mouth when you cough or sneeze.  Always remember to wash your hands.  Get Help Right Away If:  You develop worsening fever or sinus pain.  You develop a severe head ache or visual changes.  Your symptoms persist after you have completed your treatment plan.  Make sure you  Understand  these instructions.  Will watch your condition.  Will get help right away if you are not doing well or get worse.  Your e-visit answers were reviewed by a board certified advanced clinical practitioner to complete your personal care plan.  Depending on the condition, your plan could have included both over the counter or prescription medications.  If there is a problem please reply  once you have received a response from your provider.  Your safety is important to [place].  If you have drug allergies check your prescription carefully.    You can use MyChart to ask questions about today's visit, request a non-urgent call back, or ask for a work or school excuse for 24 hours related to this e-Visit. If it has been greater than 24 hours you will need to follow up with your provider, or enter a new e-Visit to address those concerns.  You will get an e-mail in the next two days asking about your experience.  I hope that your e-visit has been valuable and will speed your recovery. Thank you for using e-visits.   Greater than 5 minutes, yet less than 10 minutes of time have been spent researching, coordinating and implementing care for this patient today.

## 2020-07-01 ENCOUNTER — Telehealth: Payer: Medicaid Other | Admitting: Family

## 2020-07-01 ENCOUNTER — Telehealth: Payer: Self-pay | Admitting: Nurse Practitioner

## 2020-07-01 DIAGNOSIS — J019 Acute sinusitis, unspecified: Secondary | ICD-10-CM

## 2020-07-01 DIAGNOSIS — J0100 Acute maxillary sinusitis, unspecified: Secondary | ICD-10-CM

## 2020-07-01 MED ORDER — AMOXICILLIN-POT CLAVULANATE 875-125 MG PO TABS: 1.0000 | ORAL_TABLET | Freq: Two times a day (BID) | ORAL | 0 refills | Status: DC

## 2020-07-01 NOTE — Progress Notes (Signed)

## 2020-07-01 NOTE — Progress Notes (Signed)
We are sorry that you are not feeling well.  Here is how we plan to help!  I have resent your Augmentin.   Based on what you have shared with me it looks like you have sinusitis.  Sinusitis is inflammation and infection in the sinus cavities of the head.  Based on your presentation I believe you most likely have Acute Bacterial Sinusitis.  This is an infection caused by bacteria and is treated with antibiotics. I have prescribed Augmentin 875mg /125mg  one tablet twice daily with food, for 7 days. You may use an oral decongestant such as Mucinex D or if you have glaucoma or high blood pressure use plain Mucinex. Saline nasal spray help and can safely be used as often as needed for congestion.  If you develop worsening sinus pain, fever or notice severe headache and vision changes, or if symptoms are not better after completion of antibiotic, please schedule an appointment with a health care provider.    Sinus infections are not as easily transmitted as other respiratory infection, however we still recommend that you avoid close contact with loved ones, especially the very young and elderly.  Remember to wash your hands thoroughly throughout the day as this is the number one way to prevent the spread of infection!  Home Care:  Only take medications as instructed by your medical team.  Complete the entire course of an antibiotic.  Do not take these medications with alcohol.  A steam or ultrasonic humidifier can help congestion.  You can place a towel over your head and breathe in the steam from hot water coming from a faucet.  Avoid close contacts especially the very young and the elderly.  Cover your mouth when you cough or sneeze.  Always remember to wash your hands.  Get Help Right Away If:  You develop worsening fever or sinus pain.  You develop a severe head ache or visual changes.  Your symptoms persist after you have completed your treatment plan.  Make sure you  Understand these  instructions.  Will watch your condition.  Will get help right away if you are not doing well or get worse.  Your e-visit answers were reviewed by a board certified advanced clinical practitioner to complete your personal care plan.  Depending on the condition, your plan could have included both over the counter or prescription medications.  If there is a problem please reply  once you have received a response from your provider.  Your safety is important to .  If you have drug allergies check your prescription carefully.    You can use MyChart to ask questions about todays visit, request a non-urgent call back, or ask for a work or school excuse for 24 hours related to this e-Visit. If it has been greater than 24 hours you will need to follow up with your provider, or enter a new e-Visit to address those concerns.  You will get an e-mail in the next two days asking about your experience.  I hope that your e-visit has been valuable and will speed your recovery. Thank you for using e-visits.   Approximately 5 minutes was spent documenting and reviewing patient's chart.

## 2020-07-21 ENCOUNTER — Telehealth: Payer: Medicaid Other | Admitting: Nurse Practitioner

## 2020-07-21 DIAGNOSIS — J019 Acute sinusitis, unspecified: Secondary | ICD-10-CM

## 2020-07-21 NOTE — Progress Notes (Signed)
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 You were just on augmentin 2 weeks ago. Since this is recurrent you will need a face to face visit.    NOTE: If you entered your credit card information for this eVisit, you will not be charged. You may see a "hold" on your card for the $35 but that hold will drop off and you will not have a charge processed.  If you are having a true medical emergency please call 911.     For an urgent face to face visit, [place] has four urgent care centers for your convenience:   . [facility] Center    [phone]                  Get Driving Directions  [address] [place], [place] [address] . 10 am to 8 pm Monday-Friday . 12 pm to 8 pm Saturday-Sunday   . [facility] Health Urgent Care at [facility]  [phone]                  Get Driving Directions  [address] [place], [place] [address] . 8 am to 8 pm Monday-Friday . 9 am to 6 pm Saturday . 11 am to 6 pm Sunday   . [facility] Health Urgent Care at [facility]  [phone]                  Get Driving Directions   [address] [place], [place] [address] . 8 am to 8 pm Monday-Friday . 8 am to 4 pm Saturday-Sunday    . [facility] Health Urgent Care at [facility] Directions  [phone]  [address] [place], [place] [address]  . Monday-Friday, 12 PM to 6 PM    Your e-visit answers were reviewed by a board certified advanced clinical practitioner to complete your personal care plan.  Thank you for using e-Visits.

## 2020-07-31 ENCOUNTER — Telehealth: Payer: Medicaid Other | Admitting: Family

## 2020-07-31 DIAGNOSIS — J329 Chronic sinusitis, unspecified: Secondary | ICD-10-CM

## 2020-07-31 NOTE — Progress Notes (Signed)
Based on what you shared with me, I feel your condition warrants further evaluation and I recommend that you be seen for a face to face visit.  Please contact your primary care physician practice to be seen. Many offices offer virtual options to be seen via video if you are not comfortable going in person to a medical facility at this time.  You may need to see a specialist. This has been on-going too long.  If you do not have a PCP, [facility] offers a free physician referral service available at [phone]. Our trained staff has the experience, knowledge and resources to put you in touch with a physician who is right for you.   You also have the option of a video visit through [email or web address]  If you are having a true medical emergency please call 911.  NOTE: If you entered your credit card information for this eVisit, you will not be charged. You may see a "hold" on your card for the $35 but that hold will drop off and you will not have a charge processed.  Your e-visit answers were reviewed by a board certified advanced clinical practitioner to complete your personal care plan.  Thank you for using e-Visits.

## 2020-08-14 ENCOUNTER — Telehealth: Payer: Medicaid Other | Admitting: Nurse Practitioner

## 2020-08-14 DIAGNOSIS — N3000 Acute cystitis without hematuria: Secondary | ICD-10-CM | POA: Diagnosis not present

## 2020-08-14 MED ORDER — NITROFURANTOIN MONOHYD MACRO 100 MG PO CAPS: 100.0000 mg | ORAL_CAPSULE | Freq: Two times a day (BID) | ORAL | 0 refills | Status: DC

## 2020-08-14 NOTE — Progress Notes (Signed)

## 2020-08-15 ENCOUNTER — Telehealth: Payer: Medicaid Other | Admitting: Nurse Practitioner

## 2020-08-15 DIAGNOSIS — B9689 Other specified bacterial agents as the cause of diseases classified elsewhere: Secondary | ICD-10-CM

## 2020-08-15 DIAGNOSIS — N760 Acute vaginitis: Secondary | ICD-10-CM

## 2020-08-15 MED ORDER — METRONIDAZOLE 500 MG PO TABS: 500.0000 mg | ORAL_TABLET | Freq: Two times a day (BID) | ORAL | 0 refills | Status: DC

## 2020-08-15 NOTE — Progress Notes (Signed)

## 2020-08-18 ENCOUNTER — Telehealth: Payer: Medicaid Other | Admitting: Physician Assistant

## 2020-08-18 DIAGNOSIS — R0789 Other chest pain: Secondary | ICD-10-CM

## 2020-08-18 NOTE — Progress Notes (Signed)
Based on what you shared with me, I feel your condition warrants further evaluation and I recommend that you be seen for a face to face office visit.  In review of your chart, your PCP advised you to seek care in the emergency department for your symptoms. I am not able to adequately assess your chest discomfort through this type of visit. You will need to be seen in person to have a physical exam completed and may need further workup including a chest xray.    NOTE: If you entered your credit card information for this eVisit, you will not be charged. You may see a "hold" on your card for the $35 but that hold will drop off and you will not have a charge processed.   If you are having a true medical emergency please call 911.      For an urgent face to face visit, [place] has five urgent care centers for your convenience:     [facility] Health Urgent Care Center at [facility] Directions [phone] [address] [place], [place] [address] . 10 am - 6pm Monday - Friday    [facility] Health Urgent Care Center [facility]) Get Driving Directions [phone] [address] [place], [place] [address] . 10 am to 8 pm Monday-Friday . 12 pm to 8 pm [facility] Urgent Care at [facility] Get Driving Directions [phone] [address] [place], [place] [address] . 8 am to 8 pm Monday-Friday . 9 am to 6 pm Saturday . 11 am to 6 pm Sunday     [facility] Urgent Care at [facility] Get Driving Directions  [phone] [address] [place], [place] [address] . 8 am to 8 pm Monday-Friday . 8 am to 4 pm [facility] Urgent Care at [facility] Directions [phone] [address] [place], [place] [address] . 12 pm to 6 pm Monday-Friday      Your e-visit answers were reviewed by a board certified advanced clinical practitioner to complete your personal care plan.   Thank you for using e-Visits.   Approximately 5 minutes was spent documenting and reviewing patient's chart.

## 2020-08-20 ENCOUNTER — Other Ambulatory Visit: Payer: Self-pay

## 2020-08-20 ENCOUNTER — Encounter (HOSPITAL_BASED_OUTPATIENT_CLINIC_OR_DEPARTMENT_OTHER): Payer: Self-pay | Admitting: *Deleted

## 2020-08-20 ENCOUNTER — Emergency Department (HOSPITAL_BASED_OUTPATIENT_CLINIC_OR_DEPARTMENT_OTHER): Payer: Medicaid Other

## 2020-08-20 DIAGNOSIS — J45909 Unspecified asthma, uncomplicated: Secondary | ICD-10-CM | POA: Insufficient documentation

## 2020-08-20 DIAGNOSIS — T50995A Adverse effect of other drugs, medicaments and biological substances, initial encounter: Secondary | ICD-10-CM | POA: Insufficient documentation

## 2020-08-20 DIAGNOSIS — F17200 Nicotine dependence, unspecified, uncomplicated: Secondary | ICD-10-CM | POA: Insufficient documentation

## 2020-08-20 DIAGNOSIS — R072 Precordial pain: Secondary | ICD-10-CM | POA: Diagnosis not present

## 2020-08-20 DIAGNOSIS — T887XXA Unspecified adverse effect of drug or medicament, initial encounter: Secondary | ICD-10-CM | POA: Diagnosis not present

## 2020-08-20 LAB — CBC
HCT: 38.7 % (ref 36.0–46.0)
Hemoglobin: 12.5 g/dL (ref 12.0–15.0)
MCH: 24.3 pg — ABNORMAL LOW (ref 26.0–34.0)
MCHC: 32.3 g/dL (ref 30.0–36.0)
MCV: 75.3 fL — ABNORMAL LOW (ref 80.0–100.0)
Platelets: 377 10*3/uL (ref 150–400)
RBC: 5.14 MIL/uL — ABNORMAL HIGH (ref 3.87–5.11)
RDW: 13.8 % (ref 11.5–15.5)
WBC: 8.2 10*3/uL (ref 4.0–10.5)
nRBC: 0 % (ref 0.0–0.2)

## 2020-08-20 LAB — TROPONIN I (HIGH SENSITIVITY): Troponin I (High Sensitivity): <2 ng/L (ref <18)

## 2020-08-20 LAB — BASIC METABOLIC PANEL
Anion gap: 11 (ref 5–15)
BUN: 22 mg/dL — ABNORMAL HIGH (ref 6–20)
CO2: 21 mmol/L — ABNORMAL LOW (ref 22–32)
Calcium: 9.1 mg/dL (ref 8.9–10.3)
Chloride: 103 mmol/L (ref 98–111)
Creatinine, Ser: 0.84 mg/dL (ref 0.44–1.00)
GFR, Estimated: >60 mL/min (ref >60)
Glucose, Bld: 105 mg/dL — ABNORMAL HIGH (ref 70–99)
Potassium: 3.6 mmol/L (ref 3.5–5.1)
Sodium: 135 mmol/L (ref 135–145)

## 2020-08-20 NOTE — ED Triage Notes (Signed)
Chest pain x 3 days. She started taking Rx diet pills prior to pain.

## 2020-08-21 ENCOUNTER — Emergency Department (HOSPITAL_BASED_OUTPATIENT_CLINIC_OR_DEPARTMENT_OTHER)
Admission: EM | Admit: 2020-08-21 | Discharge: 2020-08-21 | Disposition: A | Discharge status: Home / Self Care | Payer: Medicaid Other | Attending: Emergency Medicine | Admitting: Emergency Medicine

## 2020-08-21 ENCOUNTER — Encounter (HOSPITAL_BASED_OUTPATIENT_CLINIC_OR_DEPARTMENT_OTHER): Payer: Self-pay | Admitting: Emergency Medicine

## 2020-08-21 DIAGNOSIS — T50905A Adverse effect of unspecified drugs, medicaments and biological substances, initial encounter: Secondary | ICD-10-CM

## 2020-08-21 LAB — TROPONIN I (HIGH SENSITIVITY): Troponin I (High Sensitivity): <2 ng/L (ref <18)

## 2020-08-21 LAB — PREGNANCY, URINE: Preg Test, Ur: NEGATIVE

## 2020-08-21 MED ORDER — ACETAMINOPHEN 500 MG PO TABS
1000.0000 mg | ORAL_TABLET | Freq: Once | ORAL | Status: AC
  Administered 2020-08-21: 1000 mg via ORAL
  Filled 2020-08-21: qty 2

## 2020-08-21 MED ORDER — ALUM & MAG HYDROXIDE-SIMETH 200-200-20 MG/5ML PO SUSP
30.0000 mL | Freq: Once | ORAL | Status: AC
  Administered 2020-08-21: 30 mL via ORAL
  Filled 2020-08-21: qty 30

## 2020-08-21 NOTE — ED Provider Notes (Signed)
[facility] EMERGENCY DEPARTMENT Provider Note   CSN: [id number] Arrival date & time: 08/20/20  2239     History Chief Complaint  Patient presents with  . Chest Pain    Rachel Clay is a 30 y.o. female.  The history is provided by the patient.  Chest Pain Pain location:  Substernal area Pain quality: dull   Pain radiates to:  Does not radiate Pain severity:  Mild Onset quality:  Gradual Duration:  4 days Timing:  Constant Progression:  Unchanged Chronicity:  New Context: at rest   Relieved by:  Nothing Worsened by:  Nothing Ineffective treatments:  None tried Associated symptoms: no anorexia, no back pain, no cough, no diaphoresis, no fatigue, no fever, no heartburn, no lower extremity edema, no numbness, no orthopnea, no palpitations, no PND, no shortness of breath, no syncope, no vomiting and no weakness   Associated symptoms comment:  No travel no OCP Risk factors: no birth control and not female   Chest pain x 3 days since starting 3 diet pills.  No f/c/r.  No travel, no leg pain.       Past Medical History:  Diagnosis Date  . Asthma    childhood    Patient Active Problem List   Diagnosis Date Noted  . 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 Insufficient prenatal care 05/02/2012    Past Surgical History:  Procedure Laterality Date  . NASAL SINUS SURGERY       OB History    Gravida  4   Para  2   Term  2   Preterm      AB      Living  2     SAB      IAB      Ectopic      Multiple      Live Births  2           Family History  Problem Relation Age of Onset  . Hypertension Mother   . Sleep apnea Daughter     Social History   Tobacco Use  . Smoking status: Current Every Day Smoker    Last attempt to quit: 06/20/2018    Years since quitting: 2.1  . Smokeless tobacco: Never Used  Vaping Use  . Vaping Use: Never used  Substance Use Topics  . Alcohol use: Yes  . Drug use: No    Home Medications Prior  to Admission medications   Medication Sig Start Date End Date Taking? Authorizing Provider  albuterol (VENTOLIN HFA) 108 (90 Base) MCG/ACT inhaler Inhale 1-2 puffs into the lungs every 6 (six) hours as needed for wheezing or shortness of breath. 05/18/20  Yes Hawks, Christy A, FNP  azelastine (ASTELIN) 0.1 % nasal spray Place 1 spray into both nostrils 2 (two) times daily. Use in each nostril as directed 06/26/20  Yes McVey, Madelaine Bhat, PA-C  metroNIDAZOLE (FLAGYL) 500 MG tablet Take 1 tablet (500 mg total) by mouth 2 (two) times daily. 08/15/20  Yes Martin, Mary-Margaret, FNP  nitrofurantoin, macrocrystal-monohydrate, (MACROBID) 100 MG capsule Take 1 capsule (100 mg total) by mouth 2 (two) times daily. 1 po BId 08/14/20  Yes Daphine Deutscher, Mary-Margaret, FNP  Pseudoephedrine-Guaifenesin [facility] D PO) Take by mouth.   Yes [provider]  amoxicillin-clavulanate (AUGMENTIN) 875-125 MG tablet Take 1 tablet by mouth 2 (two) times daily. 07/01/20   Junie Spencer, FNP  cyclobenzaprine (FLEXERIL) 10 MG tablet Take 1 tablet (10 mg total) by mouth 3 (three)  times daily as needed for muscle spasms. 02/12/18   Terrilee Files, MD  fluticasone (FLONASE) 50 MCG/ACT nasal spray Place 2 sprays into both nostrils daily for 14 days. 04/05/20 04/19/20  Gailen Shelter, PA  ibuprofen (ADVIL) 600 MG tablet Take 1 tablet (600 mg total) by mouth every 8 (eight) hours as needed. 04/23/20   Olive Bass, FNP    Allergies    Patient has no known allergies.  Review of Systems   Review of Systems  Constitutional: Negative for diaphoresis, fatigue and fever.  HENT: Negative for congestion.   Eyes: Negative for visual disturbance.  Respiratory: Negative for cough and shortness of breath.   Cardiovascular: Positive for chest pain. Negative for palpitations, orthopnea, syncope and PND.  Gastrointestinal: Negative for anorexia, heartburn and vomiting.  Genitourinary: Negative for difficulty urinating.   Musculoskeletal: Negative for back pain.  Neurological: Negative for weakness and numbness.  Psychiatric/Behavioral: Negative for agitation.  All other systems reviewed and are negative.   Physical Exam Updated Vital Signs BP 121/80 (BP Location: Left Arm)   Pulse 67   Temp 98.4 F (36.9 C) (Oral)   Resp 16   Ht 5\' 7"  (1.702 m)   Wt 119.7 kg   LMP 07/30/2020   SpO2 100%   BMI 41.32 kg/m   Physical Exam Vitals and nursing note reviewed.  Constitutional:      Appearance: Normal appearance. She is not ill-appearing.  HENT:     Head: Normocephalic and atraumatic.     Nose: Nose normal.  Eyes:     Conjunctiva/sclera: Conjunctivae normal.     Pupils: Pupils are equal, round, and reactive to light.  Cardiovascular:     Rate and Rhythm: Normal rate and regular rhythm.     Pulses: Normal pulses.     Heart sounds: Normal heart sounds.  Pulmonary:     Effort: Pulmonary effort is normal.     Breath sounds: Normal breath sounds.  Abdominal:     General: Abdomen is flat. Bowel sounds are normal.     Palpations: Abdomen is soft.     Tenderness: There is no abdominal tenderness. There is no guarding or rebound.  Musculoskeletal:        General: Normal range of motion.     Cervical back: Normal range of motion and neck supple.  Skin:    General: Skin is warm and dry.     Capillary Refill: Capillary refill takes less than 2 seconds.  Neurological:     General: No focal deficit present.     Mental Status: She is alert and oriented to person, place, and time.     Deep Tendon Reflexes: Reflexes normal.  Psychiatric:        Mood and Affect: Mood normal.        Behavior: Behavior normal.     ED Results / Procedures / Treatments   Labs (all labs ordered are listed, but only abnormal results are displayed) Results for orders placed or performed during the hospital encounter of 08/21/20  Basic metabolic panel  Result Value Ref Range   Sodium 135 135 - 145 mmol/L   Potassium  3.6 3.5 - 5.1 mmol/L   Chloride 103 98 - 111 mmol/L   CO2 21 (L) 22 - 32 mmol/L   Glucose, Bld 105 (H) 70 - 99 mg/dL   BUN 22 (H) 6 - 20 mg/dL   Creatinine, Ser 10/19/20 0.44 - 1.00 mg/dL   Calcium 9.1 8.9 - 3.47 mg/dL  GFR, Estimated >60 >60 mL/min   Anion gap 11 5 - 15  CBC  Result Value Ref Range   WBC 8.2 4.0 - 10.5 K/uL   RBC 5.14 (H) 3.87 - 5.11 MIL/uL   Hemoglobin 12.5 12.0 - 15.0 g/dL   HCT 33.5 45.6 - 25.6 %   MCV 75.3 (L) 80.0 - 100.0 fL   MCH 24.3 (L) 26.0 - 34.0 pg   MCHC 32.3 30.0 - 36.0 g/dL   RDW 38.9 37.3 - 42.8 %   Platelets 377 150 - 400 K/uL   nRBC 0.0 0.0 - 0.2 %  Pregnancy, urine  Result Value Ref Range   Preg Test, Ur NEGATIVE NEGATIVE  Troponin I (High Sensitivity)  Result Value Ref Range   Troponin I (High Sensitivity) <2 <18 ng/L  Troponin I (High Sensitivity)  Result Value Ref Range   Troponin I (High Sensitivity) <2 <18 ng/L   DG Chest 2 View  Result Date: 08/20/2020 CLINICAL DATA:  30 year old female with chest pain. EXAM: CHEST - 2 VIEW COMPARISON:  Chest radiograph dated 05/11/2020 FINDINGS: The heart size and mediastinal contours are within normal limits. Both lungs are clear. The visualized skeletal structures are unremarkable. IMPRESSION: No active cardiopulmonary disease. Electronically Signed   By: Elgie Collard M.D.   On: 08/20/2020 23:02    EKG EKG Interpretation  Date/Time:  Thursday August 20 2020 22:41:14 EST Ventricular Rate:  84 PR Interval:  166 QRS Duration: 82 QT Interval:  382 QTC Calculation: 451 R Axis:   76 Text Interpretation: Normal sinus rhythm Nonspecific T wave abnormality improved from previous Confirmed by Nicanor Alcon, Elyana Grabski ([id number]) on 08/21/2020 12:06:21 AM   Radiology DG Chest 2 View  Result Date: 08/20/2020 CLINICAL DATA:  30 year old female with chest pain. EXAM: CHEST - 2 VIEW COMPARISON:  Chest radiograph dated 05/11/2020 FINDINGS: The heart size and mediastinal contours are within normal limits. Both lungs  are clear. The visualized skeletal structures are unremarkable. IMPRESSION: No active cardiopulmonary disease. Electronically Signed   By: Elgie Collard M.D.   On: 08/20/2020 23:02    Procedures Procedures   Medications Ordered in ED Medications  acetaminophen (TYLENOL) tablet 1,000 mg (1,000 mg Oral Given 08/21/20 0130)  alum & mag hydroxide-simeth (MAALOX/MYLANTA) 200-200-20 MG/5ML suspension 30 mL (30 mLs Oral Given 08/21/20 0130)    ED Course  I have reviewed the triage vital signs and the nursing notes.  Pertinent labs & imaging results that were available during my care of the patient were reviewed by me and considered in my medical decision making (see chart for details).    PERC negative wells 0 highly doubt PE in this low risk patient.  I suspect this is a medication side effect.  Patient has ruled out for MI in the ED HEART score is 1 low risk for MACE.  Stop these medications and discuss this with your regular doctor.  Strict return precautions given.    Mykalia Satterlee was evaluated in Emergency Department on 08/21/2020 for the symptoms described in the history of present illness. She was evaluated in the context of the global COVID-19 pandemic, which necessitated consideration that the patient might be at risk for infection with the SARS-CoV-2 virus that causes COVID-19. Institutional protocols and algorithms that pertain to the evaluation of patients at risk for COVID-19 are in a state of rapid change based on information released by regulatory bodies including the CDC and federal and state organizations. These policies and algorithms were followed during the patient's care  in the ED.  Final Clinical Impression(s) / ED Diagnoses Return for intractable cough, coughing up blood, fevers >100.4 unrelieved by medication, shortness of breath, intractable vomiting, chest pain, shortness of breath, weakness, numbness, changes in speech, facial asymmetry, abdominal pain, passing out, Inability  to tolerate liquids or food, cough, altered mental status or any concerns. No signs of systemic illness or infection. The patient is nontoxic-appearing on exam and vital signs are within normal limits.  I have reviewed the triage vital signs and the nursing notes. Pertinent labs & imaging results that were available during my care of the patient were reviewed by me and considered in my medical decision making (see chart for details). After history, exam, and medical workup I feel the patient has been appropriately medically screened and is safe for discharge home. Pertinent diagnoses were discussed with the patient. Patient was given return precautions.    Steffan Caniglia, MD 08/21/20 [phone]

## 2020-08-21 NOTE — ED Notes (Signed)
Delta trop

## 2020-08-24 ENCOUNTER — Telehealth: Payer: Medicaid Other | Admitting: Physician Assistant

## 2020-08-24 DIAGNOSIS — R0602 Shortness of breath: Secondary | ICD-10-CM

## 2020-08-24 DIAGNOSIS — R0789 Other chest pain: Secondary | ICD-10-CM

## 2020-08-24 NOTE — Progress Notes (Signed)
Based on what you shared with me, I feel your condition warrants further evaluation and I recommend that you be seen for a face to face office visit.   NOTE: If you entered your credit card information for this eVisit, you will not be charged. You may see a "hold" on your card for the $35 but that hold will drop off and you will not have a charge processed.   If you are having a true medical emergency please call 911.      For an urgent face to face visit, [facility] has five urgent care centers for your convenience:     [facility] [place] Get Driving Directions [phone] [address] [place], [place] [address] . 10 am - 6pm Monday - Friday    [facility] ([place]) Get Driving Directions [phone] [address] [place], [place] [address] . 10 am to 8 pm Monday-Friday . 12 pm to 8 pm Saturday-Sunday     [facility] Get Driving Directions [phone] [address], [address] [place], [place] [address] . 8 am to 8 pm Monday-Friday . 9 am to 6 pm Saturday . 11 am to 6 pm Sunday     [facility] [place] Get Driving Directions  [phone] [address] [place], [place] [address] . 8 am to 8 pm Monday-Friday . 8 am to 4 pm Saturday-Sunday    [facility] [place] Get Driving Directions [phone] [address] [place], [place] [address] . 12 pm to 6 pm Monday-Friday      Your e-visit answers were reviewed by a board certified advanced clinical practitioner to complete your personal care plan.  Thank you for using e-Visits.     

## 2020-09-25 ENCOUNTER — Telehealth: Payer: Medicaid Other | Admitting: Nurse Practitioner

## 2020-09-25 DIAGNOSIS — J4542 Moderate persistent asthma with status asthmaticus: Secondary | ICD-10-CM | POA: Diagnosis not present

## 2020-09-25 MED ORDER — ALBUTEROL SULFATE HFA 108 (90 BASE) MCG/ACT IN AERS: 1.0000 | INHALATION_SPRAY | Freq: Four times a day (QID) | RESPIRATORY_TRACT | 0 refills | Status: DC | PRN

## 2020-09-25 NOTE — Progress Notes (Signed)
Visit for Asthma  Based on what you have shared with me, it looks like you may have a flare up of your asthma.  Asthma is a chronic (ongoing) lung disease which results in airway obstruction, inflammation and hyper-responsiveness.   Asthma symptoms vary from person to person, with common symptoms including nighttime awakening and decreased ability to participate in normal activities as a result of shortness of breath. It is often triggered by changes in weather, changes in the season, changes in air temperature, or inside (home, school, daycare or work) allergens such as animal dander, mold, mildew, woodstoves or cockroaches.   It can also be triggered by hormonal changes, extreme emotion, physical exertion or an upper respiratory tract illness.     It is important to identify the trigger, and then eliminate or avoid the trigger if possible.   If you have been prescribed medications to be taken on a regular basis, it is important to follow the asthma action plan and to follow guidelines to adjust medication in response to increasing symptoms of decreased peak expiratory flow rate  Treatment: I have prescribed: Albuterol (Proventil HFA; Ventolin HFA) 108 (90 Base) MCG/ACT Inhaler 2 puffs into the lungs every six hours as needed for wheezing or shortness of breath  HOME CARE . Only take medications as instructed by your medical team. . Consider wearing a mask or scarf to improve breathing air temperature have been shown to decrease irritation and decrease exacerbations . Get rest. . Taking a steamy shower or using a humidifier may help nasal congestion sand ease sore throat pain. You can place a towel over your head and breathe in the steam from hot water coming from a faucet. . Using a saline nasal spray works much the same way.  . Cough drops, hare candies and sore throat lozenges may  ease your cough.  . Avoid close contacts especially the very you and the elderly . Cover your mouth if you cough or sneeze . Always remember to wash your hands.    GET HELP RIGHT AWAY IF: . You develop worsening symptoms; breathlessness at rest, drowsy, confused or agitated, unable to speak in full sentences . You have coughing fits . You develop a severe headache or visual changes . You develop shortness of breath, difficulty breathing or start having chest pain . Your symptoms persist after you have completed your treatment plan . If your symptoms do not improve within 10 days  MAKE SURE YOU . Understand these instructions. . Will watch your condition. . Will get help right away if you are not doing well or get worse.   Your e-visit answers were reviewed by a board certified advanced clinical practitioner to complete your personal care plan, Depending upon the condition, your plan could have included both over the counter or prescription medications.  Please review your pharmacy choice. Your safety is important to us. If you have drug allergies check your prescription carefully. You can use MyChart to ask questions about today's visit, request a non-urgent call back, or ask for a work or school excuse for 24 hours related to this e-Visit. If it has been greater than 24 hours you will need to follow up with your provider, or enter a new e-Visit to address those concerns.  You will get an e-mail in the next two days asking about your experience. I hope that your e-visit has been valuable and will speed your recovery. Thank you for using e-visits.  5-10 minutes spent reviewing and documenting   in chart.

## 2020-10-07 ENCOUNTER — Encounter (HOSPITAL_BASED_OUTPATIENT_CLINIC_OR_DEPARTMENT_OTHER): Payer: Self-pay | Admitting: Emergency Medicine

## 2020-10-07 ENCOUNTER — Emergency Department (HOSPITAL_BASED_OUTPATIENT_CLINIC_OR_DEPARTMENT_OTHER): Payer: Medicaid Other

## 2020-10-07 ENCOUNTER — Other Ambulatory Visit: Payer: Self-pay

## 2020-10-07 ENCOUNTER — Emergency Department (HOSPITAL_BASED_OUTPATIENT_CLINIC_OR_DEPARTMENT_OTHER)
Admission: EM | Admit: 2020-10-07 | Discharge: 2020-10-07 | Disposition: A | Discharge status: Home / Self Care | Payer: Medicaid Other | Attending: Emergency Medicine | Admitting: Emergency Medicine

## 2020-10-07 DIAGNOSIS — R52 Pain, unspecified: Secondary | ICD-10-CM

## 2020-10-07 DIAGNOSIS — Z7952 Long term (current) use of systemic steroids: Secondary | ICD-10-CM | POA: Diagnosis not present

## 2020-10-07 DIAGNOSIS — R0789 Other chest pain: Secondary | ICD-10-CM | POA: Insufficient documentation

## 2020-10-07 DIAGNOSIS — R079 Chest pain, unspecified: Secondary | ICD-10-CM

## 2020-10-07 DIAGNOSIS — F419 Anxiety disorder, unspecified: Secondary | ICD-10-CM | POA: Diagnosis not present

## 2020-10-07 DIAGNOSIS — F17200 Nicotine dependence, unspecified, uncomplicated: Secondary | ICD-10-CM | POA: Insufficient documentation

## 2020-10-07 DIAGNOSIS — J45909 Unspecified asthma, uncomplicated: Secondary | ICD-10-CM | POA: Insufficient documentation

## 2020-10-07 LAB — CBC WITH DIFFERENTIAL/PLATELET
Abs Immature Granulocytes: 0.01 10*3/uL (ref 0.00–0.07)
Basophils Absolute: 0.1 10*3/uL (ref 0.0–0.1)
Basophils Relative: 1 %
Eosinophils Absolute: 0.5 10*3/uL (ref 0.0–0.5)
Eosinophils Relative: 6 %
HCT: 37.6 % (ref 36.0–46.0)
Hemoglobin: 11.8 g/dL — ABNORMAL LOW (ref 12.0–15.0)
Immature Granulocytes: 0 %
Lymphocytes Relative: 28 %
Lymphs Abs: 2.1 10*3/uL (ref 0.7–4.0)
MCH: 23.9 pg — ABNORMAL LOW (ref 26.0–34.0)
MCHC: 31.4 g/dL (ref 30.0–36.0)
MCV: 76.1 fL — ABNORMAL LOW (ref 80.0–100.0)
Monocytes Absolute: 0.6 10*3/uL (ref 0.1–1.0)
Monocytes Relative: 7 %
Neutro Abs: 4.4 10*3/uL (ref 1.7–7.7)
Neutrophils Relative %: 58 %
Platelets: 365 10*3/uL (ref 150–400)
RBC: 4.94 MIL/uL (ref 3.87–5.11)
RDW: 14.1 % (ref 11.5–15.5)
WBC: 7.6 10*3/uL (ref 4.0–10.5)
nRBC: 0 % (ref 0.0–0.2)

## 2020-10-07 LAB — BASIC METABOLIC PANEL
Anion gap: 10 (ref 5–15)
BUN: 14 mg/dL (ref 6–20)
CO2: 26 mmol/L (ref 22–32)
Calcium: 9.2 mg/dL (ref 8.9–10.3)
Chloride: 100 mmol/L (ref 98–111)
Creatinine, Ser: 0.65 mg/dL (ref 0.44–1.00)
GFR, Estimated: >60 mL/min (ref >60)
Glucose, Bld: 101 mg/dL — ABNORMAL HIGH (ref 70–99)
Potassium: 4 mmol/L (ref 3.5–5.1)
Sodium: 136 mmol/L (ref 135–145)

## 2020-10-07 LAB — TROPONIN I (HIGH SENSITIVITY): Troponin I (High Sensitivity): <2 ng/L (ref <18)

## 2020-10-07 NOTE — Discharge Instructions (Signed)
Call your primary care doctor or specialist as discussed in the next 2-3 days.   Return immediately back to the ER if:  Your symptoms worsen within the next 12-24 hours. You develop new symptoms such as new fevers, persistent vomiting, new pain, shortness of breath, or new weakness or numbness, or if you have any other concerns.  

## 2020-10-07 NOTE — ED Triage Notes (Signed)
Reports having issues with anxiety for the last year on and off.  This episode started the beginning of February.  Endorses feeling chest tightness, heart racing, and feel lightheaded.

## 2020-10-07 NOTE — ED Provider Notes (Signed)
[facility] EMERGENCY DEPARTMENT Provider Note   CSN: [id number] Arrival date & time: 10/07/20  1518     History Chief Complaint  Patient presents with  . Anxiety    Rachel Clay is a 30 y.o. female.  Patient presents with concern of chest tightness.  Describes as intermittent lasting 25 minutes at a time.  Occurred 3 times today while at rest.  She had similar episodes several times within the last few months.  Last episode was last month where she had it off and on for about a week.  She was evaluated by the primary care doctor and given to metoprolol.  At some point she also went to emergency department and had a negative cardiac work-up and was advised to follow-up again with her doctor.  She had recurrence of symptoms again today but currently symptoms have resolved and she has no discomfort.  No reports of fevers or cough.  No vomiting or diarrhea.  Symptoms do not appear to be related to exertional effort.        Past Medical History:  Diagnosis Date  . Asthma    childhood    Patient Active Problem List   Diagnosis Date Noted  . 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 Insufficient prenatal care 05/02/2012    Past Surgical History:  Procedure Laterality Date  . NASAL SINUS SURGERY       OB History    Gravida  4   Para  2   Term  2   Preterm      AB      Living  2     SAB      IAB      Ectopic      Multiple      Live Births  2           Family History  Problem Relation Age of Onset  . Hypertension Mother   . Sleep apnea Daughter     Social History   Tobacco Use  . Smoking status: Current Every Day Smoker    Last attempt to quit: 06/20/2018    Years since quitting: 2.3  . Smokeless tobacco: Never Used  Vaping Use  . Vaping Use: Never used  Substance Use Topics  . Alcohol use: Yes  . Drug use: No    Home Medications Prior to Admission medications   Medication Sig Start Date End Date Taking? Authorizing  Provider  albuterol (VENTOLIN HFA) 108 (90 Base) MCG/ACT inhaler Inhale 1-2 puffs into the lungs every 6 (six) hours as needed for wheezing or shortness of breath. 09/25/20   Daphine Deutscher Mary-Margaret, FNP  amoxicillin-clavulanate (AUGMENTIN) 875-125 MG tablet Take 1 tablet by mouth 2 (two) times daily. 07/01/20   Jannifer Rodney A, FNP  azelastine (ASTELIN) 0.1 % nasal spray Place 1 spray into both nostrils 2 (two) times daily. Use in each nostril as directed 06/26/20   McVey, Madelaine Bhat, PA-C  cyclobenzaprine (FLEXERIL) 10 MG tablet Take 1 tablet (10 mg total) by mouth 3 (three) times daily as needed for muscle spasms. 02/12/18   Terrilee Files, MD  fluticasone (FLONASE) 50 MCG/ACT nasal spray Place 2 sprays into both nostrils daily for 14 days. 04/05/20 04/19/20  Gailen Shelter, PA  ibuprofen (ADVIL) 600 MG tablet Take 1 tablet (600 mg total) by mouth every 8 (eight) hours as needed. 04/23/20   Olive Bass, FNP  metroNIDAZOLE (FLAGYL) 500 MG tablet Take 1 tablet (500 mg total) by mouth  2 (two) times daily. 08/15/20   Daphine Deutscher, Mary-Margaret, FNP  nitrofurantoin, macrocrystal-monohydrate, (MACROBID) 100 MG capsule Take 1 capsule (100 mg total) by mouth 2 (two) times daily. 1 po BId 08/14/20   Daphine Deutscher, Mary-Margaret, FNP  Pseudoephedrine-Guaifenesin [facility] D PO) Take by mouth.    [provider]    Allergies    Patient has no known allergies.  Review of Systems   Review of Systems  Constitutional: Negative for fever.  HENT: Negative for ear pain.   Eyes: Negative for pain.  Respiratory: Negative for cough.   Cardiovascular: Positive for chest pain.  Gastrointestinal: Negative for abdominal pain.  Genitourinary: Negative for flank pain.  Musculoskeletal: Negative for back pain.  Skin: Negative for rash.  Neurological: Negative for headaches.    Physical Exam Updated Vital Signs BP 116/88   Pulse 77   Temp 98.9 F (37.2 C) (Oral)   Resp 16   Ht 5\' 7"  (1.702 m)    Wt 117.9 kg   SpO2 100%   BMI 40.72 kg/m   Physical Exam Constitutional:      General: She is not in acute distress.    Appearance: Normal appearance.  HENT:     Head: Normocephalic.     Nose: Nose normal.  Eyes:     Extraocular Movements: Extraocular movements intact.  Cardiovascular:     Rate and Rhythm: Normal rate.  Pulmonary:     Effort: Pulmonary effort is normal.  Musculoskeletal:        General: Normal range of motion.     Cervical back: Normal range of motion.     Right lower leg: No edema.     Left lower leg: No edema.  Neurological:     General: No focal deficit present.     Mental Status: She is alert. Mental status is at baseline.     ED Results / Procedures / Treatments   Labs (all labs ordered are listed, but only abnormal results are displayed) Labs Reviewed  CBC WITH DIFFERENTIAL/PLATELET - Abnormal; Notable for the following components:      Result Value   Hemoglobin 11.8 (*)    MCV 76.1 (*)    MCH 23.9 (*)    All other components within normal limits  BASIC METABOLIC PANEL - Abnormal; Notable for the following components:   Glucose, Bld 101 (*)    All other components within normal limits  TROPONIN I (HIGH SENSITIVITY)    EKG None  Radiology DG Chest Port 1 View  Result Date: 10/07/2020 CLINICAL DATA:  Chest tightness EXAM: PORTABLE CHEST 1 VIEW COMPARISON:  09/28/2020 FINDINGS: The heart size and mediastinal contours are within normal limits. Both lungs are clear. The visualized skeletal structures are unremarkable. IMPRESSION: No active disease. Electronically Signed   By: 09/30/2020   On: 10/07/2020 16:29    Procedures Procedures   Medications Ordered in ED Medications - No data to display  ED Course  I have reviewed the triage vital signs and the nursing notes.  Pertinent labs & imaging results that were available during my care of the patient were reviewed by me and considered in my medical decision making (see chart for  details).    MDM Rules/Calculators/A&P                          EKG shows borderline ST depressions in inferior leads, these are similar to prior EKGs in the past.  Otherwise sinus rhythm, normal rate.  Half labs are said is unremarkable except electrolytes are normal troponin is undetectable.  I will recommend outpatient follow-up with your doctor or cardiologist within the week.  Advising immediate return for worsening symptoms trouble breathing or any additional concerns.   Final Clinical Impression(s) / ED Diagnoses Final diagnoses:  Chest pain, unspecified type    Rx / DC Orders ED Discharge Orders    None       Cheryll Cockayne, MD 10/07/20 [phone]

## 2020-10-12 ENCOUNTER — Ambulatory Visit (INDEPENDENT_AMBULATORY_CARE_PROVIDER_SITE_OTHER): Payer: Medicaid Other | Admitting: Allergy

## 2020-10-12 ENCOUNTER — Other Ambulatory Visit: Payer: Self-pay

## 2020-10-12 ENCOUNTER — Encounter: Payer: Self-pay | Admitting: Allergy

## 2020-10-12 VITALS — BP 124/70 | HR 87 | Temp 98.8°F | Resp 16 | Ht 67.0 in | Wt 263.4 lb

## 2020-10-12 DIAGNOSIS — L2089 Other atopic dermatitis: Secondary | ICD-10-CM

## 2020-10-12 DIAGNOSIS — J4540 Moderate persistent asthma, uncomplicated: Secondary | ICD-10-CM | POA: Diagnosis not present

## 2020-10-12 DIAGNOSIS — T7800XD Anaphylactic reaction due to unspecified food, subsequent encounter: Secondary | ICD-10-CM | POA: Diagnosis not present

## 2020-10-12 MED ORDER — EPINEPHRINE 0.3 MG/0.3ML IJ SOAJ: 0.3000 mg | INTRAMUSCULAR | 1 refills | Status: DC | PRN

## 2020-10-12 MED ORDER — LEVALBUTEROL TARTRATE 45 MCG/ACT IN AERO: 2.0000 | INHALATION_SPRAY | Freq: Four times a day (QID) | RESPIRATORY_TRACT | 1 refills | Status: AC | PRN

## 2020-10-12 MED ORDER — BUDESONIDE-FORMOTEROL FUMARATE 160-4.5 MCG/ACT IN AERO: 2.0000 | INHALATION_SPRAY | Freq: Two times a day (BID) | RESPIRATORY_TRACT | 5 refills | Status: DC

## 2020-10-12 NOTE — Patient Instructions (Addendum)
-   have access to Xopenex inhaler 2 puffs every 4-6 hours as needed for cough/wheeze/shortness of breath/chest tightness.  May use 15-20 minutes prior to activity.   Monitor frequency of use.   - Xopenex replaced your albuterol.  Xopenex should not cause heart racing or jitteriness you experience with albuterol use   - start Symbicort 2 puffs twice a day with spacer device  Asthma control goals:   Full participation in all desired activities (may need albuterol before activity)  Albuterol use two time or less a week on average (not counting use with activity)  Cough interfering with sleep two time or less a month  Oral steroids no more than once a year  No hospitalizations  - continue daily moisturization - continue as needed use of Triamcinolone (topical steroid ointment) and Eucrisa (non-steroid ointment) for eczema rash.   Use Triamcinolone on neck down.  Can use Eucrisa anywhere on body if needed  - continue avoidance of all milk including baked products - have access to self-injectable epinephrine (Epipen or AuviQ) 0.3mg  at all times - follow emergency action plan in case of allergic reaction   Follow-up 2-3 months or sooner if needed

## 2020-10-12 NOTE — Progress Notes (Signed)
New Patient Note  RE: Rachel Clay MRN: [id number] DOB: May 21, 1991 Date of Office Visit: 10/12/2020  Referring provider: No ref. provider found Primary care provider: Verlee Rossetti, PA-C    Chief Complaint: Asthma  History of present illness: Rachel Clay is a 30 y.o. female presenting today for evaluation of asthma.  She has a history of childhood.  She states she had asthma 'real bad' as a child and states it stopped and didn't have issues with it for about 17 years.  She states she had an asthma flare in October 2021 as she was having chest pain, shortness of breath and felt like she couldn't breath.  She states she stopped smoking with this episode.  She states another flareup in February 2022.  She states she has been to the ED about 6 times now since the onset of her symptoms. She still reports chest tightness, shortness of breath that is happening about every other day.  Denies nighttime awakenings.  She feels like the albuterol makes her symptoms worse but states it makes her heart race more and feel jittery.  She states the albuterol does help relieve chest tightness.  She has been given prednisone course at ED visit in February.   She is not sure what set her asthma off other than the smoking.  She does states weather changes was a trigger as a child.   She did have her most recent ED visit on 10/07/2020 for concern of chest tightness.  She had a another previous ED visit for similar concerns where she had a negative cardiac work-up.  She had a normal chest x-ray on 09/28/2020 at a another ED visit. She did see her PCP in regards to the symptoms and was prescribed metoprolol as well as to take antireflux medication. She states she stopped taking metoprolol as was advised by EMS to stop.  She also is not taking antireflux medication.  She was prescribed hydroxyzine as needed for anxiety but she does not believe she has anxiety.    She does report getting eczema moreso in the summer  due to sweating. She will use triamcinolone and also has been prescribed eucrisa.  These topical therapies do help her eczema control.  She does moisturize daily.   She does report a dairy allergy.  She reports facial swelling and difficulty breathing.  This started to be an issue in 2011.  She avoids all dairy products including baked milk products.  She states when she has eaten baked milk she may get a tingle in her mouth and hive on face.  She does not have an epinephrine device.   She denies any symptoms consistent with allergic rhinitis or conjunctivitis. She states she did stop smoking in October 2021.   Review of systems in the past 4 weeks: Review of Systems  Constitutional: Negative.   HENT: Negative.   Eyes: Negative.   Respiratory:       See HPI  Cardiovascular: Negative.   Gastrointestinal: Negative.   Musculoskeletal: Negative.   Skin: Negative.   Neurological: Negative.     All other systems negative unless noted above in HPI  Past medical history: Past Medical History:  Diagnosis Date  . Asthma    childhood  . Eczema     Past surgical history: Past Surgical History:  Procedure Laterality Date  . NASAL SINUS SURGERY      Family history:  Family History  Problem Relation Age of Onset  . Hypertension Mother   .  Eczema Mother   . Sleep apnea Daughter   . Eczema Maternal Aunt   . Allergic rhinitis Neg Hx   . Angioedema Neg Hx   . Asthma Neg Hx   . Atopy Neg Hx   . Immunodeficiency Neg Hx   . Urticaria Neg Hx     Social history: Lives in a home with carpeting with gas heating and window cooling.  No pets in the home.  There is no concern for water damage, mildew or roaches in the home.  She is a Lawyer.  She quit smoking in October 2021.  Started smoking in April 2000.  Does report smoking cigars.  Medication List: Current Outpatient Medications  Medication Sig Dispense Refill  . azelastine (ASTELIN) 0.1 % nasal spray Place 1 spray into both nostrils 2  (two) times daily. Use in each nostril as directed 30 mL 0  . budesonide-formoterol (SYMBICORT) 160-4.5 MCG/ACT inhaler Inhale 2 puffs into the lungs 2 (two) times daily. 1 each 5  . EPINEPHrine 0.3 mg/0.3 mL IJ SOAJ injection Inject 0.3 mg into the muscle as needed for anaphylaxis. 1 each 1  . EUCRISA 2 % OINT APPLY TO AFFECTED AREA TWICE A DAY AS NEEDED    . fluticasone (FLONASE) 50 MCG/ACT nasal spray Place 2 sprays into both nostrils daily for 14 days. 11.1 mL 0  . hydrOXYzine (ATARAX/VISTARIL) 10 MG tablet Take 10 mg by mouth daily as needed.    Marland Kitchen ibuprofen (ADVIL) 600 MG tablet Take 1 tablet (600 mg total) by mouth every 8 (eight) hours as needed. 20 tablet 0  . levalbuterol (XOPENEX HFA) 45 MCG/ACT inhaler Inhale 2 puffs into the lungs every 6 (six) hours as needed for wheezing. 1 each 1  . levonorgestrel (MIRENA) 20 MCG/24HR IUD by Intrauterine route.    Maudry Mayhew AD 4-10-325 MG TABS Take 1 tablet by mouth 2 (two) times daily.    . Pseudoephedrine-Guaifenesin (MUCINEX D PO) Take by mouth.    . triamcinolone (KENALOG) 0.1 % APPLY TO AFFECTED AREA TWICE DAILY AS NEEDED     No current facility-administered medications for this visit.    Known medication allergies: Allergies  Allergen Reactions  . Milk-Related Compounds Shortness Of Breath    dairy     Physical examination: Blood pressure 124/70, pulse 87, temperature 98.8 F (37.1 C), temperature source Temporal, resp. rate 16, height 5\' 7"  (1.702 m), weight 263 lb 6.4 oz (119.5 kg), SpO2 99 %.  General: Alert, interactive, in no acute distress. HEENT: PERRLA, TMs pearly gray, turbinates non-edematous without discharge, post-pharynx non erythematous. Neck: Supple without lymphadenopathy. Lungs: Clear to auscultation without wheezing, rhonchi or rales. {no increased work of breathing. CV: Normal S1, S2 without murmurs. Abdomen: Nondistended, nontender. Skin: Warm and dry, without lesions or rashes. Extremities:  No clubbing,  cyanosis or edema. Neuro:   Grossly intact.  Diagnositics/Labs: Labs:  Component     Latest Ref Rng & Units 10/07/2020  WBC     4.0 - 10.5 K/uL 7.6  RBC     3.87 - 5.11 MIL/uL 4.94  Hemoglobin     12.0 - 15.0 g/dL 10/09/2020 (L)  HCT     40.9 - 46.0 % 37.6  MCV     80.0 - 100.0 fL 76.1 (L)  MCH     26.0 - 34.0 pg 23.9 (L)  MCHC     30.0 - 36.0 g/dL 81.1  RDW     91.4 - 78.2 % 14.1  Platelets  150 - 400 K/uL 365  nRBC     0.0 - 0.2 % 0.0  Neutrophils     % 58  NEUT#     1.7 - 7.7 K/uL 4.4  Lymphocytes     % 28  Lymphocyte #     0.7 - 4.0 K/uL 2.1  Monocytes Relative     % 7  Monocyte #     0.1 - 1.0 K/uL 0.6  Eosinophil     % 6  Eosinophils Absolute     0.0 - 0.5 K/uL 0.5  Basophil     % 1  Basophils Absolute     0.0 - 0.1 K/uL 0.1  Immature Granulocytes     % 0  Abs Immature Granulocytes     0.00 - 0.07 K/uL 0.01  Sodium     135 - 145 mmol/L 136  Potassium     3.5 - 5.1 mmol/L 4.0  Chloride     98 - 111 mmol/L 100  CO2     22 - 32 mmol/L 26  Glucose     70 - 99 mg/dL 144 (H)  BUN     6 - 20 mg/dL 14  Creatinine     8.18 - 1.00 mg/dL 5.63  Calcium     8.9 - 10.3 mg/dL 9.2  GFR, Estimated     >60 mL/min >60  Anion gap     5 - 15 10  Troponin I (High Sensitivity)     <18 ng/L <2    Spirometry: FEV1: 3.03L 101%, FVC: 3.36L 95%, ratio consistent with Nonobstructive pattern   Assessment and plan: Asthma, moderate persistent  -Not well controlled at this time - have access to Xopenex inhaler 2 puffs every 4-6 hours as needed for cough/wheeze/shortness of breath/chest tightness.  May use 15-20 minutes prior to activity.   Monitor frequency of use.   - Xopenex replaced your albuterol.  Xopenex should not cause heart racing or jitteriness you experience with albuterol use   - start Symbicort 2 puffs twice a day with spacer device  Asthma control goals:   Full participation in all desired activities (may need albuterol before  activity)  Albuterol use two time or less a week on average (not counting use with activity)  Cough interfering with sleep two time or less a month  Oral steroids no more than once a year  No hospitalizations  Eczema - continue daily moisturization - continue as needed use of Triamcinolone (topical steroid ointment) and Eucrisa (non-steroid ointment) for eczema rash.   Use Triamcinolone on neck down.  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 Please do not hesitate to contact me with questions.  Sincerely,   Margo Aye, MD Allergy/Immunology [facility]

## 2020-10-14 ENCOUNTER — Other Ambulatory Visit: Payer: Self-pay

## 2020-10-14 MED ORDER — EPINEPHRINE 0.3 MG/0.3ML IJ SOAJ: 0.3000 mg | INTRAMUSCULAR | 1 refills | Status: DC | PRN

## 2020-10-14 NOTE — Telephone Encounter (Signed)
Pt. Aware that her epipens were sent to [facility] bc [facility] always wants a PA for the mylan generic brand of epipens.

## 2020-10-20 ENCOUNTER — Telehealth: Payer: Self-pay

## 2020-10-20 MED ORDER — PREDNISONE 10 MG PO TABS: ORAL_TABLET | ORAL | 0 refills | Status: DC

## 2020-10-20 NOTE — Telephone Encounter (Signed)
She thinks did use Symbicort 2 puffs twice a day with the spacer. Use Xopenex as needed every 4-6 hours for symptom relief. To jumpstart symptom control can have her take prednisone course: 20 mg twice a day for 3 days, 20 mg once a day for 2 days, 10 mg once a day for 1 day and stop.  This should help open up her airway so that the Symbicort can be more effective to maintain control.  She needs to continue the Symbicort while doing the prednisone course.

## 2020-10-20 NOTE — Telephone Encounter (Signed)
Patient called today saying she is still having trouble with her airways/chest tightness, wheezing, and chest pain.  She thinks it might be a little worse than when she first came to see you last week.  She is using the Symbicort about 2 puffs once daily.  No change to her other medications since last visit.  Please advise.

## 2020-10-20 NOTE — Telephone Encounter (Signed)
Patient informed of Dr. Randell Patient message and Al Corpus sent to pharmacy.

## 2020-10-20 NOTE — Addendum Note (Signed)
Addended by: Alphonzo Cruise on: 10/20/2020 11:39 AM   Modules accepted: Orders

## 2020-10-21 ENCOUNTER — Emergency Department (HOSPITAL_BASED_OUTPATIENT_CLINIC_OR_DEPARTMENT_OTHER): Payer: Medicaid Other

## 2020-10-21 ENCOUNTER — Other Ambulatory Visit: Payer: Self-pay

## 2020-10-21 ENCOUNTER — Encounter (HOSPITAL_BASED_OUTPATIENT_CLINIC_OR_DEPARTMENT_OTHER): Payer: Self-pay

## 2020-10-21 ENCOUNTER — Emergency Department (HOSPITAL_BASED_OUTPATIENT_CLINIC_OR_DEPARTMENT_OTHER)
Admission: EM | Admit: 2020-10-21 | Discharge: 2020-10-22 | Disposition: A | Discharge status: Home / Self Care | Payer: Medicaid Other | Attending: Emergency Medicine | Admitting: Emergency Medicine

## 2020-10-21 DIAGNOSIS — R0789 Other chest pain: Secondary | ICD-10-CM | POA: Insufficient documentation

## 2020-10-21 DIAGNOSIS — R002 Palpitations: Secondary | ICD-10-CM | POA: Diagnosis not present

## 2020-10-21 DIAGNOSIS — R0602 Shortness of breath: Secondary | ICD-10-CM | POA: Insufficient documentation

## 2020-10-21 DIAGNOSIS — Z7951 Long term (current) use of inhaled steroids: Secondary | ICD-10-CM | POA: Diagnosis not present

## 2020-10-21 DIAGNOSIS — Z87891 Personal history of nicotine dependence: Secondary | ICD-10-CM | POA: Diagnosis not present

## 2020-10-21 DIAGNOSIS — J45909 Unspecified asthma, uncomplicated: Secondary | ICD-10-CM | POA: Insufficient documentation

## 2020-10-21 DIAGNOSIS — R079 Chest pain, unspecified: Secondary | ICD-10-CM

## 2020-10-21 LAB — COMPREHENSIVE METABOLIC PANEL
ALT: 26 U/L (ref 0–44)
AST: 22 U/L (ref 15–41)
Albumin: 4.6 g/dL (ref 3.5–5.0)
Alkaline Phosphatase: 38 U/L (ref 38–126)
Anion gap: 12 (ref 5–15)
BUN: 13 mg/dL (ref 6–20)
CO2: 22 mmol/L (ref 22–32)
Calcium: 9.6 mg/dL (ref 8.9–10.3)
Chloride: 102 mmol/L (ref 98–111)
Creatinine, Ser: 0.76 mg/dL (ref 0.44–1.00)
GFR, Estimated: >60 mL/min (ref >60)
Glucose, Bld: 117 mg/dL — ABNORMAL HIGH (ref 70–99)
Potassium: 3.9 mmol/L (ref 3.5–5.1)
Sodium: 136 mmol/L (ref 135–145)
Total Bilirubin: 0.3 mg/dL (ref 0.3–1.2)
Total Protein: 8.2 g/dL — ABNORMAL HIGH (ref 6.5–8.1)

## 2020-10-21 LAB — CBC WITH DIFFERENTIAL/PLATELET
Abs Immature Granulocytes: 0.03 10*3/uL (ref 0.00–0.07)
Basophils Absolute: 0.1 10*3/uL (ref 0.0–0.1)
Basophils Relative: 1 %
Eosinophils Absolute: 0.2 10*3/uL (ref 0.0–0.5)
Eosinophils Relative: 2 %
HCT: 38.5 % (ref 36.0–46.0)
Hemoglobin: 12.5 g/dL (ref 12.0–15.0)
Immature Granulocytes: 0 %
Lymphocytes Relative: 30 %
Lymphs Abs: 3.2 10*3/uL (ref 0.7–4.0)
MCH: 24.1 pg — ABNORMAL LOW (ref 26.0–34.0)
MCHC: 32.5 g/dL (ref 30.0–36.0)
MCV: 74.2 fL — ABNORMAL LOW (ref 80.0–100.0)
Monocytes Absolute: 0.7 10*3/uL (ref 0.1–1.0)
Monocytes Relative: 6 %
Neutro Abs: 6.7 10*3/uL (ref 1.7–7.7)
Neutrophils Relative %: 61 %
Platelets: 377 10*3/uL (ref 150–400)
RBC: 5.19 MIL/uL — ABNORMAL HIGH (ref 3.87–5.11)
RDW: 13.7 % (ref 11.5–15.5)
WBC: 10.8 10*3/uL — ABNORMAL HIGH (ref 4.0–10.5)
nRBC: 0 % (ref 0.0–0.2)

## 2020-10-21 LAB — TROPONIN I (HIGH SENSITIVITY): Troponin I (High Sensitivity): <2 ng/L (ref <18)

## 2020-10-21 LAB — D-DIMER, QUANTITATIVE: D-Dimer, Quant: 0.29 ug/mL-FEU (ref 0.00–0.50)

## 2020-10-21 NOTE — ED Triage Notes (Addendum)
Pt c/o intermittent CP x 2 months-states she has been seen for same multiple times-dx with anxiety with rx and change in asthma meds with no relief-NAD-steady

## 2020-10-21 NOTE — ED Provider Notes (Signed)
[facility] EMERGENCY DEPARTMENT Provider Note   CSN: [id number] Arrival date & time: 10/21/20  2033     History Chief Complaint  Patient presents with  . Chest Pain    Rachel Clay is a 30 y.o. female.  HPI      2 months of episodes of chest pain, sometimes palpitations and heart racing Feels like a pressure right side of chest and shortness of breath/tight airway Has been seen several times in ED and urgent cares regarding these symptoms. Similar today Worse with stress, when crying, a little bit worse with exertion No associated nausea, vomiting, sweating. Sometimes lightheadedness. No abdominal pain/cough/leg pain or swelling/hx of DVT/PE All week has had these symptoms, somedays ok and the next day feeling   Taking OCPs, no hx recent surgeries, long trips car/airplane No family hx of early heart disease Stopped smoking,no etoh, drugs     Past Medical History:  Diagnosis Date  . Asthma    childhood  . Eczema     Patient Active Problem List   Diagnosis Date Noted  . Moderate persistent asthma, uncomplicated 10/12/2020  . Allergy with anaphylaxis due to food, subsequent encounter 10/12/2020  . Flexural atopic dermatitis 10/12/2020  . 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 Insufficient prenatal care 05/02/2012    Past Surgical History:  Procedure Laterality Date  . NASAL SINUS SURGERY       OB History    Gravida  4   Para  2   Term  2   Preterm      AB      Living  2     SAB      IAB      Ectopic      Multiple      Live Births  2           Family History  Problem Relation Age of Onset  . Hypertension Mother   . Eczema Mother   . Sleep apnea Daughter   . Eczema Maternal Aunt   . Allergic rhinitis Neg Hx   . Angioedema Neg Hx   . Asthma Neg Hx   . Atopy Neg Hx   . Immunodeficiency Neg Hx   . Urticaria Neg Hx     Social History   Tobacco Use  . Smoking status: Former Smoker    Types: Cigarettes     Quit date: 04/17/2020    Years since quitting: 0.5  . Smokeless tobacco: Never Used  Vaping Use  . Vaping Use: Never used  Substance Use Topics  . Alcohol use: Yes    Comment: occ  . Drug use: No    Home Medications Prior to Admission medications   Medication Sig Start Date End Date Taking? Authorizing Provider  azelastine (ASTELIN) 0.1 % nasal spray Place 1 spray into both nostrils 2 (two) times daily. Use in each nostril as directed 06/26/20   McVey, Madelaine Bhat, PA-C  budesonide-formoterol [facility]) 160-4.5 MCG/ACT inhaler Inhale 2 puffs into the lungs 2 (two) times daily. 10/12/20   Marcelyn Bruins, MD  EPINEPHrine 0.3 mg/0.3 mL IJ SOAJ injection Inject 0.3 mg into the muscle as needed for anaphylaxis. 10/14/20   Marcelyn Bruins, MD  EUCRISA 2 % OINT APPLY TO AFFECTED AREA TWICE A DAY AS NEEDED 10/05/20   [provider]  fluticasone (FLONASE) 50 MCG/ACT nasal spray Place 2 sprays into both nostrils daily for 14 days. 04/05/20 04/19/20  Gailen Shelter, PA  ibuprofen (ADVIL) 600  MG tablet Take 1 tablet (600 mg total) by mouth every 8 (eight) hours as needed. 04/23/20   Olive Bass, FNP  levalbuterol [facility] HFA) 45 MCG/ACT inhaler Inhale 2 puffs into the lungs every 6 (six) hours as needed for wheezing. 10/12/20   Marcelyn Bruins, MD  levonorgestrel (MIRENA) 20 MCG/24HR IUD by Intrauterine route.    [provider]  NOREL AD 4-10-325 MG TABS Take 1 tablet by mouth 2 (two) times daily. 07/08/20   [provider]  predniSONE (DELTASONE) 10 MG tablet Take two tablets (20mg ) twice daily for three days. Then two tablets (20mg ) once daily for two days.  Then one tablet (10mg ) once daily for one day. 10/20/20   , MD  Pseudoephedrine-Guaifenesin [facility] D PO) Take by mouth.    [provider]  triamcinolone (KENALOG) 0.1 % APPLY TO AFFECTED AREA TWICE DAILY AS NEEDED 08/16/19   [provider]    Allergies    Milk-related compounds  Review of Systems   Review of Systems  Constitutional: Negative for fever.  HENT: Negative for sore throat.   Eyes: Negative for visual disturbance.  Respiratory: Positive for shortness of breath. Negative for cough.   Cardiovascular: Positive for chest pain.  Gastrointestinal: Negative for abdominal pain, nausea and vomiting.  Genitourinary: Negative for difficulty urinating.  Musculoskeletal: Negative for back pain and neck pain.  Skin: Negative for rash.  Neurological: Negative for syncope and headaches.    Physical Exam Updated Vital Signs BP 122/86   Pulse 84   Temp 98.4 F (36.9 C) (Oral)   Resp (!) 21   Ht 5\' 7"  (1.702 m)   Wt 118.4 kg   LMP 09/26/2020   SpO2 99%   BMI 40.88 kg/m   Physical Exam Vitals and nursing note reviewed.  Constitutional:      General: She is not in acute distress.    Appearance: She is well-developed. She is not diaphoretic.  HENT:     Head: Normocephalic and atraumatic.  Eyes:     Conjunctiva/sclera: Conjunctivae normal.  Cardiovascular:     Rate and Rhythm: Normal rate and regular rhythm.     Heart sounds: Normal heart sounds. No murmur heard. No friction rub. No gallop.   Pulmonary:     Effort: Pulmonary effort is normal. No respiratory distress.     Breath sounds: Normal breath sounds. No wheezing or rales.  Abdominal:     General: There is no distension.     Palpations: Abdomen is soft.     Tenderness: There is no abdominal tenderness. There is no guarding.  Musculoskeletal:        General: No tenderness.     Cervical back: Normal range of motion.  Skin:    General: Skin is warm and dry.     Findings: No erythema or rash.  Neurological:     Mental Status: She is alert and oriented to person, place, and time.     ED Results / Procedures / Treatments   Labs (all labs ordered are listed, but only abnormal results are displayed) Labs Reviewed  CBC WITH  DIFFERENTIAL/PLATELET - Abnormal; Notable for the following components:      Result Value   WBC 10.8 (*)    RBC 5.19 (*)    MCV 74.2 (*)    MCH 24.1 (*)    All other components within normal limits  COMPREHENSIVE METABOLIC PANEL - Abnormal; Notable for the following components:   Glucose, Bld  117 (*)    Total Protein 8.2 (*)    All other components within normal limits  D-DIMER, QUANTITATIVE  TROPONIN I (HIGH SENSITIVITY)  TROPONIN I (HIGH SENSITIVITY)    EKG EKG Interpretation  Date/Time:  Wednesday October 21 2020 20:43:45 EDT Ventricular Rate:  97 PR Interval:  154 QRS Duration: 84 QT Interval:  346 QTC Calculation: 439 R Axis:   61 Text Interpretation: Normal sinus rhythm Nonspecific T wave abnormality Abnormal ECG Similar ST changes in lateral inferior leads to prior Confirmed by Alvira Monday ([id number]) on 10/21/2020 8:55:30 PM   Radiology DG Chest 2 View  Result Date: 10/21/2020 CLINICAL DATA:  Shortness of breath.  Intermittent chest pain. EXAM: CHEST - 2 VIEW COMPARISON:  Chest x-ray 10/07/2020 FINDINGS: The heart size and mediastinal contours are within normal limits. No focal consolidation. No pulmonary edema. No pleural effusion. No pneumothorax. No acute osseous abnormality. IMPRESSION: No active cardiopulmonary disease. Electronically Signed   By: Tish Frederickson M.D.   On: 10/21/2020 21:38    Procedures Procedures   Medications Ordered in ED Medications - No data to display  ED Course  I have reviewed the triage vital signs and the nursing notes.  Pertinent labs & imaging results that were available during my care of the patient were reviewed by me and considered in my medical decision making (see chart for details).    MDM Rules/Calculators/A&P                          29yo female with history asthma, eczema, positive anti-C antibody), multiple visits to the emergency department with concern for chest pain and shortness of breath, presents with concern for  chest pain.  Differential diagnosis for chest pain includes pulmonary embolus, dissection, pneumothorax, pneumonia, ACS, myocarditis, pericarditis.  EKG was done and evaluate by me and showed no acute ST changes and no signs of pericarditis, similar TW inversion inferiorly as prior. Chest x-ray was done and evaluated by me and radiology and showed sign of pneumonia or pneumothorax. Patient is low risk wells with negative ddimer and have low suspicion for PE.  Patient is low risk HEART score and had delta troponins which were both negative.  Do not feel history or exam are consistent with aortic dissection.  No sign of asthma exacerbation on exam.   Recommend follow up with Cardiology for palpitations and recurrent chest pain. Patient discharged in stable condition with understanding of reasons to return.    Final Clinical Impression(s) / ED Diagnoses Final diagnoses:  Nonspecific chest pain  Palpitations    Rx / DC Orders ED Discharge Orders    None       Alvira Monday, MD 10/22/20 [phone]

## 2020-10-21 NOTE — Telephone Encounter (Signed)
Pt states she does not think the prednisone is going to work for her this time, so pt asked if she could take the steroid shots for asthma.   Please advise.

## 2020-10-21 NOTE — Telephone Encounter (Signed)
Please advise to steroid shot for pt

## 2020-10-22 LAB — TROPONIN I (HIGH SENSITIVITY): Troponin I (High Sensitivity): <2 ng/L (ref <18)

## 2020-10-22 NOTE — Telephone Encounter (Signed)
Correct.  If started the prednisone would complete course and see if it does improve symptoms

## 2020-10-22 NOTE — Telephone Encounter (Signed)
If she has not received and started the prednisone course and if she is still having asthma symptoms then it is fine that she come in and get a depo Medrol 80 mg injection

## 2020-10-22 NOTE — Telephone Encounter (Signed)
So if the pt is currently on/taking oral prednisone then she not receive the depo? Is that correct.

## 2020-10-23 MED ORDER — BUDESONIDE-FORMOTEROL FUMARATE 160-4.5 MCG/ACT IN AERO: 2.0000 | INHALATION_SPRAY | Freq: Two times a day (BID) | RESPIRATORY_TRACT | 5 refills | Status: DC

## 2020-10-23 NOTE — Telephone Encounter (Signed)
Oh my.... well that makes sense if she did not start the Symbicort.  Yes she needs to take it 2 puffs twice a day until she comes back for follow-up.   Please reiterate Symbicort is a maintanence medication that is suppose to stabilize the airway to help prevent symptom onset.

## 2020-10-23 NOTE — Telephone Encounter (Signed)
Pt was informed of all of that when we was on the original call

## 2020-10-23 NOTE — Addendum Note (Signed)
Addended by: Berna Bue on: 10/23/2020 01:45 PM   Modules accepted: Orders

## 2020-10-23 NOTE — Telephone Encounter (Addendum)
Spoke with pt and she states she is doing better this morning, she stopped taking he prednisone yesterday morning because she says her breathing was getting worse. I informed her that she could NOT get a steroid infection while also taking oral prednisone but since she is doing better to continue her medication regimen and she can call us back if her breathing flares up again.

## 2020-10-23 NOTE — Telephone Encounter (Addendum)
Pt woke up and had some shortness of breathing and some wheezing. Pt did xopenex and and is not feeling better. Pt never got the symbicort so sent back in rx for that and told pt to take it 2 puffs bid and let us know if she is not feeling better before 5 today and if she can not be seen here today to go to urgent care or er. Pt stated her understanding

## 2020-10-26 ENCOUNTER — Telehealth: Payer: Self-pay | Admitting: Allergy

## 2020-10-26 NOTE — Telephone Encounter (Signed)
Pt's states symbicort is not working, EMS was called on Friday. Pt asking for Whitney Post to return her call.

## 2020-10-27 NOTE — Telephone Encounter (Signed)
Can she come in tomorrow for one of the sick visit slots?

## 2020-10-27 NOTE — Telephone Encounter (Signed)
Appointment made for tomorrow at 3:20 pm

## 2020-10-28 ENCOUNTER — Encounter: Payer: Self-pay | Admitting: Allergy

## 2020-10-28 ENCOUNTER — Other Ambulatory Visit: Payer: Self-pay

## 2020-10-28 ENCOUNTER — Ambulatory Visit (INDEPENDENT_AMBULATORY_CARE_PROVIDER_SITE_OTHER): Payer: Medicaid Other | Admitting: Allergy

## 2020-10-28 VITALS — BP 126/90 | HR 78 | Temp 98.1°F | Resp 16

## 2020-10-28 DIAGNOSIS — T7800XD Anaphylactic reaction due to unspecified food, subsequent encounter: Secondary | ICD-10-CM

## 2020-10-28 DIAGNOSIS — J4540 Moderate persistent asthma, uncomplicated: Secondary | ICD-10-CM | POA: Diagnosis not present

## 2020-10-28 DIAGNOSIS — L2089 Other atopic dermatitis: Secondary | ICD-10-CM | POA: Diagnosis not present

## 2020-10-28 MED ORDER — METHYLPREDNISOLONE ACETATE 40 MG/ML IJ SUSP
40.0000 mg | Freq: Once | INTRAMUSCULAR | Status: AC
  Administered 2020-10-28: 40 mg via INTRAMUSCULAR

## 2020-10-28 MED ORDER — FLOVENT HFA 110 MCG/ACT IN AERO: INHALATION_SPRAY | RESPIRATORY_TRACT | 5 refills | Status: DC

## 2020-10-28 MED ORDER — LEVALBUTEROL TARTRATE 45 MCG/ACT IN AERO: 2.0000 | INHALATION_SPRAY | Freq: Four times a day (QID) | RESPIRATORY_TRACT | 1 refills | Status: DC | PRN

## 2020-10-28 NOTE — Progress Notes (Signed)
Follow-up Note  RE: Rachel Clay MRN: [id number] DOB: 26-Nov-1990 Date of Office Visit: 10/28/2020   History of present illness: Rachel Clay is a 30 y.o. female presenting today for follow-up of asthma.  She also has history of eczema and food allergy.  She was last seen in the office on 10/12/2020 by myself.  She states she continues to have flares of her asthma about every 2 weeks or so.  It is starting to worry her greatly.  At the last visit recommended she change inhalers to Xopenex to decrease cardiac activity related to albuterol use.  Also recommended she start Symbicort.  She states she only received the Xopenex at the pharmacy the first time.  She did get the Symbicort this past Friday and took her first dose on Friday.  She states all week she was having symptoms of chest tightness, cough and wheeze.  She states about an hours after using the Symbicort she felt her symptoms were worsening and she felt like her chest was super tight.  She did call EMS who came to evaluate her.  She states that she did not receive any treatment but they did do an EKG and check her blood pressure.  She was not taken to the hospital.  She took Symbicort again the next day and still felt like it was worsening her symptoms.  She has not taken Symbicort since.  I also have provided her with a prescription for prednisone due to her increase in asthma symptoms to take to help with relief of symptoms.  She states she took about 1 or 2 days worth and felt like it was making her heart race.  She states that she has taken prednisone before especially for her eczema and had not had any symptoms with use.  She also recalls taking prednisone back in October for respiratory issues and states that helped at that point in time and did not note any symptoms then either.  She did see cardiology yesterday which was a previously scheduled appointment and states that her work-up yesterday was normal but they do plan to do a Holter  monitor upcoming. She continues to do daily moisturization.  She continues to avoid milk.  She has not had any reactions and is use her epinephrine device.     Review of systems: Review of Systems  Constitutional: Negative.   HENT: Negative.   Eyes: Negative.   Respiratory: Positive for cough, shortness of breath and wheezing.   Cardiovascular: Positive for palpitations.  Gastrointestinal: Negative.   Musculoskeletal: Negative.   Skin: Negative.   Neurological: Negative.     All other systems negative unless noted above in HPI  Past medical/social/surgical/family history have been reviewed and are unchanged unless specifically indicated below.  No changes  Medication List: Current Outpatient Medications  Medication Sig Dispense Refill  . EPINEPHrine 0.3 mg/0.3 mL IJ SOAJ injection Inject 0.3 mg into the muscle as needed for anaphylaxis. 2 each 1  . EUCRISA 2 % OINT APPLY TO AFFECTED AREA TWICE A DAY AS NEEDED    . fluticasone (FLOVENT HFA) 110 MCG/ACT inhaler 2 puffs twice daily with spacer to prevent coughing or wheezing. 1 each 5  . ibuprofen (ADVIL) 600 MG tablet Take 1 tablet (600 mg total) by mouth every 8 (eight) hours as needed. 20 tablet 0  . levalbuterol (XOPENEX HFA) 45 MCG/ACT inhaler Inhale 2 puffs into the lungs every 6 (six) hours as needed for wheezing. 1 each 1  .  levonorgestrel (MIRENA) 20 MCG/24HR IUD by Intrauterine route.    . Pseudoephedrine-Guaifenesin (MUCINEX D PO) Take by mouth.    . triamcinolone (KENALOG) 0.1 % APPLY TO AFFECTED AREA TWICE DAILY AS NEEDED    . azelastine (ASTELIN) 0.1 % nasal spray Place 1 spray into both nostrils 2 (two) times daily. Use in each nostril as directed (Patient not taking: Reported on 10/28/2020) 30 mL 0  . fluticasone (FLONASE) 50 MCG/ACT nasal spray Place 2 sprays into both nostrils daily for 14 days. 11.1 mL 0  . NOREL AD 4-10-325 MG TABS Take 1 tablet by mouth 2 (two) times daily. (Patient not taking: Reported on  10/28/2020)     Current Facility-Administered Medications  Medication Dose Route Frequency Provider Last Rate Last Admin  . methylPREDNISolone acetate (DEPO-MEDROL) injection 40 mg  40 mg Intramuscular Once Marcelyn Bruins, MD         Known medication allergies: Allergies  Allergen Reactions  . Milk-Related Compounds Shortness Of Breath    dairy  . Other Shortness Of Breath    dairy     Physical examination: Blood pressure 126/90, pulse 78, temperature 98.1 F (36.7 C), temperature source Temporal, resp. rate 16, SpO2 99 %.  General: Alert, interactive, in no acute distress. HEENT: PERRLA, TMs pearly gray, turbinates non-edematous without discharge, post-pharynx non erythematous. Neck: Supple without lymphadenopathy. Lungs: Mildly decreased breath sounds bilaterally without wheezing, rhonchi or rales. {no increased work of breathing. CV: Normal S1, S2 without murmurs. Abdomen: Nondistended, nontender. Skin: Warm and dry, without lesions or rashes. Extremities:  No clubbing, cyanosis or edema. Neuro:   Grossly intact.  Diagnositics/Labs:  Spirometry: FEV1: 2.48 L 82%, FVC: 2.7 L 76% predicted  Assessment and plan: Moderate persistent asthma, not well controlled  - have access to Xopenex inhaler 2 puffs every 4-6 hours as needed for cough/wheeze/shortness of breath/chest tightness.  May use 15-20 minutes prior to activity.   Monitor frequency of use.   - Xopenex replaced your albuterol.  Xopenex should not cause heart racing or jitteriness you experience with albuterol use   - change Symbicort to Flovent 2 puffs twice a day with spacer device -Depo-Medrol 40 mg steroid injection given in office today  Asthma control goals:   Full participation in all desired activities (may need albuterol before activity)  Albuterol use two time or less a week on average (not counting use with activity)  Cough interfering with sleep two time or less a month  Oral  steroids no more than once a year  No hospitalizations  Atopic dermatitis - continue daily moisturization - continue as needed use of Triamcinolone (topical steroid ointment) and Eucrisa (non-steroid ointment) for eczema rash.   Use Triamcinolone on neck down.  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 Please do not hesitate to contact me with questions.  Sincerely,   Margo Aye, MD Allergy/Immunology [facility]

## 2020-10-28 NOTE — Patient Instructions (Signed)
-   have access to Xopenex inhaler 2 puffs every 4-6 hours as needed for cough/wheeze/shortness of breath/chest tightness.  May use 15-20 minutes prior to activity.   Monitor frequency of use.   - Xopenex replaced your albuterol.  Xopenex should not cause heart racing or jitteriness you experience with albuterol use   - change Symbicort to Flovent 2 puffs twice a day with spacer device - steroid injection given in office today  Asthma control goals:   Full participation in all desired activities (may need albuterol before activity)  Albuterol use two time or less a week on average (not counting use with activity)  Cough interfering with sleep two time or less a month  Oral steroids no more than once a year  No hospitalizations  - continue daily moisturization - continue as needed use of Triamcinolone (topical steroid ointment) and Eucrisa (non-steroid ointment) for eczema rash.   Use Triamcinolone on neck down.  Can use Eucrisa anywhere on body if needed  - continue avoidance of all milk including baked products - have access to self-injectable epinephrine (Epipen or AuviQ) 0.3mg  at all times - follow emergency action plan in case of allergic reaction   Follow-up 2-3 months or sooner if needed

## 2020-11-02 ENCOUNTER — Telehealth: Payer: Self-pay | Admitting: Allergy

## 2020-11-02 ENCOUNTER — Ambulatory Visit: Payer: Self-pay | Admitting: Allergy

## 2020-11-02 NOTE — Telephone Encounter (Signed)
Pt was seen 4/13 by Dr. Delorse Lek. She uses Xopenex HFA. Okay to send Levalbuterol 0.63 or 1.25?

## 2020-11-02 NOTE — Telephone Encounter (Signed)
Patient is asking for an RX for an nebulizer please advise

## 2020-11-02 NOTE — Telephone Encounter (Signed)
Can do the 1.25 dosing q4 hours prn cough/wheeze/sob/chest tightness

## 2020-11-03 ENCOUNTER — Other Ambulatory Visit: Payer: Self-pay

## 2020-11-03 ENCOUNTER — Other Ambulatory Visit: Payer: Self-pay | Admitting: Allergy

## 2020-11-03 MED ORDER — LEVALBUTEROL HCL 1.25 MG/3ML IN NEBU: 1.2500 mg | INHALATION_SOLUTION | RESPIRATORY_TRACT | 1 refills | Status: DC | PRN

## 2020-11-03 NOTE — Telephone Encounter (Signed)
Pt called back and said she does not have a nebulizer informed sheri in [facility] to set one aside and pt will gpo by today to pick up nebulizer form [facility]

## 2020-11-03 NOTE — Telephone Encounter (Signed)
PA sent in for levalbuterol (Xopenex) to cover-my-meds by Vincent Peyer. " see TC"

## 2020-11-03 NOTE — Telephone Encounter (Signed)
Spoke with Greig Castilla at [employer] working on getting a PA for levalbuterol 1.25mg . Greig Castilla said we had to submit a different he downloaded the form to fill out and send to plan. All information sent to plan now waiting on a response.

## 2020-11-03 NOTE — Telephone Encounter (Signed)
Spoke with pt informed her I was sending in rx and verified pharmacy [facility] [address] [place]

## 2020-11-22 ENCOUNTER — Emergency Department (HOSPITAL_BASED_OUTPATIENT_CLINIC_OR_DEPARTMENT_OTHER): Payer: Medicaid Other

## 2020-11-22 ENCOUNTER — Encounter (HOSPITAL_BASED_OUTPATIENT_CLINIC_OR_DEPARTMENT_OTHER): Payer: Self-pay | Admitting: Emergency Medicine

## 2020-11-22 ENCOUNTER — Emergency Department (HOSPITAL_BASED_OUTPATIENT_CLINIC_OR_DEPARTMENT_OTHER)
Admission: EM | Admit: 2020-11-22 | Discharge: 2020-11-23 | Disposition: A | Discharge status: Home / Self Care | Payer: Medicaid Other | Attending: Emergency Medicine | Admitting: Emergency Medicine

## 2020-11-22 ENCOUNTER — Other Ambulatory Visit: Payer: Self-pay

## 2020-11-22 DIAGNOSIS — Z87891 Personal history of nicotine dependence: Secondary | ICD-10-CM | POA: Insufficient documentation

## 2020-11-22 DIAGNOSIS — Z20822 Contact with and (suspected) exposure to covid-19: Secondary | ICD-10-CM | POA: Insufficient documentation

## 2020-11-22 DIAGNOSIS — R0789 Other chest pain: Secondary | ICD-10-CM | POA: Insufficient documentation

## 2020-11-22 DIAGNOSIS — J45909 Unspecified asthma, uncomplicated: Secondary | ICD-10-CM | POA: Diagnosis not present

## 2020-11-22 DIAGNOSIS — R059 Cough, unspecified: Secondary | ICD-10-CM

## 2020-11-22 LAB — CBC WITH DIFFERENTIAL/PLATELET
Abs Immature Granulocytes: 0.01 10*3/uL (ref 0.00–0.07)
Basophils Absolute: 0.1 10*3/uL (ref 0.0–0.1)
Basophils Relative: 1 %
Eosinophils Absolute: 0.5 10*3/uL (ref 0.0–0.5)
Eosinophils Relative: 7 %
HCT: 36.8 % (ref 36.0–46.0)
Hemoglobin: 11.6 g/dL — ABNORMAL LOW (ref 12.0–15.0)
Immature Granulocytes: 0 %
Lymphocytes Relative: 35 %
Lymphs Abs: 2.5 10*3/uL (ref 0.7–4.0)
MCH: 23.8 pg — ABNORMAL LOW (ref 26.0–34.0)
MCHC: 31.5 g/dL (ref 30.0–36.0)
MCV: 75.4 fL — ABNORMAL LOW (ref 80.0–100.0)
Monocytes Absolute: 0.5 10*3/uL (ref 0.1–1.0)
Monocytes Relative: 6 %
Neutro Abs: 3.7 10*3/uL (ref 1.7–7.7)
Neutrophils Relative %: 51 %
Platelets: 360 10*3/uL (ref 150–400)
RBC: 4.88 MIL/uL (ref 3.87–5.11)
RDW: 13.8 % (ref 11.5–15.5)
WBC: 7.3 10*3/uL (ref 4.0–10.5)
nRBC: 0 % (ref 0.0–0.2)

## 2020-11-22 NOTE — ED Triage Notes (Signed)
Reports R sided chest pain describes as sore for the last 4 days.  Hx of asthma.  Reports she has had this pain before with her asthma.  Also endorses productive cough with clear sputum.

## 2020-11-22 NOTE — ED Provider Notes (Signed)
Emergency Department Provider Note   I have reviewed the triage vital signs and the nursing notes.   HISTORY  Chief Complaint Chest Pain   HPI Rachel Clay is a 30 y.o. female with past medical history of asthma presents to the emergency department with pressure to the right chest radiating to the back.  She has had an associated cough.  Symptoms been going worsening over the past 4 days but describes frequent cough and some chest pressure in the past month.  She is followed by cardiology at [facility] and states she is undergone echo and is currently wearing a ambulatory heart monitor for similar symptoms.  She has not had fevers or chills.  Her cough is sometimes productive of clear sputum.  She has not noticed swelling in the legs, unilateral or otherwise.  She not having pain or redness in the extremities.  No abdominal pain, vomiting, diarrhea.  No clear modifying factors for her symptoms.   Past Medical History:  Diagnosis Date  . Asthma    childhood  . Eczema     Patient Active Problem List   Diagnosis Date Noted  . Moderate persistent asthma, uncomplicated 10/12/2020  . Allergy with anaphylaxis due to food, subsequent encounter 10/12/2020  . Flexural atopic dermatitis 10/12/2020  . 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 Insufficient prenatal care 05/02/2012    Past Surgical History:  Procedure Laterality Date  . NASAL SINUS SURGERY      Allergies Milk-related compounds and Other  Family History  Problem Relation Age of Onset  . Hypertension Mother   . Eczema Mother   . Sleep apnea Daughter   . Eczema Maternal Aunt   . Allergic rhinitis Neg Hx   . Angioedema Neg Hx   . Asthma Neg Hx   . Atopy Neg Hx   . Immunodeficiency Neg Hx   . Urticaria Neg Hx     Social History Social History   Tobacco Use  . Smoking status: Former Smoker    Types: Cigarettes    Quit date: 04/17/2020    Years since quitting: 0.6  . Smokeless tobacco: Never Used   Vaping Use  . Vaping Use: Never used  Substance Use Topics  . Alcohol use: Yes    Comment: occ  . Drug use: No    Review of Systems  Constitutional: No fever/chills Eyes: No visual changes. ENT: No sore throat. Cardiovascular: Positive chest pain. Respiratory: Denies shortness of breath. Positive cough with clear sputum.  Gastrointestinal: No abdominal pain.  No nausea, no vomiting.  No diarrhea.  No constipation. Genitourinary: Negative for dysuria. Musculoskeletal: Negative for back pain. Skin: Negative for rash. Neurological: Negative for headaches, focal weakness or numbness.  10-point ROS otherwise negative.  ____________________________________________   PHYSICAL EXAM:  VITAL SIGNS: ED Triage Vitals  Enc Vitals Group     BP 11/22/20 2238 (!) 144/90     Pulse Rate 11/22/20 2238 93     Resp 11/22/20 2238 18     Temp 11/22/20 2238 98.3 F (36.8 C)     Temp Source 11/22/20 2238 Oral     SpO2 11/22/20 2238 100 %     Weight 11/22/20 2236 250 lb (113.4 kg)     Height 11/22/20 2236 5\' 7"  (1.702 m)   Constitutional: Alert and oriented. Well appearing and in no acute distress. Eyes: Conjunctivae are normal.  Head: Atraumatic. Nose: No congestion/rhinnorhea. Mouth/Throat: Mucous membranes are moist.   Neck: No stridor.   Cardiovascular: Normal  rate, regular rhythm. Good peripheral circulation. Grossly normal heart sounds. No JVP.  Respiratory: Normal respiratory effort.  No retractions. Lungs CTAB. No wheezing.  Gastrointestinal: Soft and nontender. No distention.  Musculoskeletal: No lower extremity tenderness nor edema. No gross deformities of extremities. Neurologic:  Normal speech and language. No gross focal neurologic deficits are appreciated.  Skin:  Skin is warm, dry and intact. No rash noted.   ____________________________________________   LABS (all labs ordered are listed, but only abnormal results are displayed)  Labs Reviewed  COMPREHENSIVE  METABOLIC PANEL - Abnormal; Notable for the following components:      Result Value   Glucose, Bld 100 (*)    Total Bilirubin 0.2 (*)    All other components within normal limits  CBC WITH DIFFERENTIAL/PLATELET - Abnormal; Notable for the following components:   Hemoglobin 11.6 (*)    MCV 75.4 (*)    MCH 23.8 (*)    All other components within normal limits  SARS CORONAVIRUS 2 (TAT 6-24 HRS)  TROPONIN I (HIGH SENSITIVITY)   ____________________________________________  EKG   EKG Interpretation  Date/Time:  Sunday Nov 22 2020 22:40:36 EDT Ventricular Rate:  93 PR Interval:  166 QRS Duration: 89 QT Interval:  359 QTC Calculation: 447 R Axis:   49 Text Interpretation: Sinus rhythm Borderline T wave abnormalities Confirmed by Alona Bene [phone]) on 11/22/2020 11:12:46 PM       ____________________________________________  RADIOLOGY  DG Chest Portable 1 View  Result Date: 11/23/2020 CLINICAL DATA:  Chest pain.  Right-sided.  Sore for last 4 days. EXAM: PORTABLE CHEST 1 VIEW.  Portable AP erect view. COMPARISON:  None. FINDINGS: Slightly more prominent cardiac silhouette and slightly more globular appearing cardiac silhouette which could be due to portable erect AP technique. Mediastinal contours are otherwise unchanged. No definite consolidation. No pulmonary edema. No pleural effusion. No pneumothorax. No acute osseous abnormality. IMPRESSION: Slightly more prominent cardiac silhouette and slightly more globular appearing cardiac silhouette which could be due to portable erect AP technique. Consider PA and lateral view for further evaluation. Electronically Signed   By: Tish Frederickson M.D.   On: 11/23/2020 00:02    ____________________________________________   PROCEDURES  Procedure(s) performed:   Procedures  None  ____________________________________________   INITIAL IMPRESSION / ASSESSMENT AND PLAN / ED COURSE  Pertinent labs & imaging results that were available  during my care of the patient were reviewed by me and considered in my medical decision making (see chart for details).   Patient presents to the emergency department with 4 days of constant right-sided pressure in the chest and cough.  Her exam is not particularly concerning for asthma.  I do not hear wheezing or bronchospastic cough.  She has good air entry and no hypoxemia.  Considered PE but feel this much less likely.  Has no tachycardia, hypoxemia, pleuritic pain in the chest.  ACS considered with negative troponin feel this is much less likely.  EKG interpreted as above.  Chest x-ray showing a slightly more prominent cardiac silhouette.  She is currently under the care of a cardiology team at [facility] and had had a recent echo and is wearing a heart monitor.  Will the patient continue this follow-up I do not see an indication for repeat emergency imaging here.  Patient feeling improved overall.  Plan for Toradol with atypical chest pain and will send a COVID PCR test with the cough.  Patient has the MyChart app and will follow the results there in  the next 24 hours. Discussed ED return precautions.    ____________________________________________  FINAL CLINICAL IMPRESSION(S) / ED DIAGNOSES  Final diagnoses:  Atypical chest pain  Cough    MEDICATIONS GIVEN DURING THIS VISIT:  Medications  ketorolac (TORADOL) 30 MG/ML injection 30 mg (has no administration in time range)    Note:  This document was prepared using Dragon voice recognition software and may include unintentional dictation errors.  Alona Bene, MD, [facility] Emergency Medicine    Samie Reasons, Arlyss Repress, MD 11/23/20 [phone]

## 2020-11-23 LAB — COMPREHENSIVE METABOLIC PANEL
ALT: 25 U/L (ref 0–44)
AST: 20 U/L (ref 15–41)
Albumin: 4.3 g/dL (ref 3.5–5.0)
Alkaline Phosphatase: 39 U/L (ref 38–126)
Anion gap: 9 (ref 5–15)
BUN: 17 mg/dL (ref 6–20)
CO2: 24 mmol/L (ref 22–32)
Calcium: 9.3 mg/dL (ref 8.9–10.3)
Chloride: 103 mmol/L (ref 98–111)
Creatinine, Ser: 0.81 mg/dL (ref 0.44–1.00)
GFR, Estimated: >60 mL/min (ref >60)
Glucose, Bld: 100 mg/dL — ABNORMAL HIGH (ref 70–99)
Potassium: 3.8 mmol/L (ref 3.5–5.1)
Sodium: 136 mmol/L (ref 135–145)
Total Bilirubin: 0.2 mg/dL — ABNORMAL LOW (ref 0.3–1.2)
Total Protein: 8 g/dL (ref 6.5–8.1)

## 2020-11-23 LAB — TROPONIN I (HIGH SENSITIVITY): Troponin I (High Sensitivity): <2 ng/L (ref <18)

## 2020-11-23 LAB — SARS CORONAVIRUS 2 (TAT 6-24 HRS): SARS Coronavirus 2: NEGATIVE

## 2020-11-23 MED ORDER — KETOROLAC TROMETHAMINE 30 MG/ML IJ SOLN
30.0000 mg | Freq: Once | INTRAMUSCULAR | Status: AC
  Administered 2020-11-23: 30 mg via INTRAVENOUS
  Filled 2020-11-23: qty 1

## 2020-11-23 NOTE — Discharge Instructions (Signed)
Please continue to follow with the cardiology team regarding your ultrasound and other testing.  If you develop sudden worsening chest pain, trouble breathing, other sudden/severe symptoms please return to the emergency department for reevaluation.

## 2020-12-21 ENCOUNTER — Ambulatory Visit: Payer: Medicaid Other | Admitting: Allergy

## 2021-01-06 ENCOUNTER — Ambulatory Visit: Payer: Medicaid Other | Admitting: Allergy

## 2021-02-02 ENCOUNTER — Other Ambulatory Visit: Payer: Self-pay

## 2021-02-02 ENCOUNTER — Encounter (HOSPITAL_BASED_OUTPATIENT_CLINIC_OR_DEPARTMENT_OTHER): Payer: Self-pay | Admitting: Emergency Medicine

## 2021-02-02 ENCOUNTER — Emergency Department (HOSPITAL_BASED_OUTPATIENT_CLINIC_OR_DEPARTMENT_OTHER)
Admission: EM | Admit: 2021-02-02 | Discharge: 2021-02-02 | Disposition: A | Discharge status: Home / Self Care | Payer: Medicaid Other | Attending: Emergency Medicine | Admitting: Emergency Medicine

## 2021-02-02 DIAGNOSIS — Z87891 Personal history of nicotine dependence: Secondary | ICD-10-CM | POA: Insufficient documentation

## 2021-02-02 DIAGNOSIS — K2900 Acute gastritis without bleeding: Secondary | ICD-10-CM | POA: Diagnosis not present

## 2021-02-02 DIAGNOSIS — J4540 Moderate persistent asthma, uncomplicated: Secondary | ICD-10-CM | POA: Insufficient documentation

## 2021-02-02 DIAGNOSIS — R1013 Epigastric pain: Secondary | ICD-10-CM

## 2021-02-02 DIAGNOSIS — Z7951 Long term (current) use of inhaled steroids: Secondary | ICD-10-CM | POA: Insufficient documentation

## 2021-02-02 LAB — CBC WITH DIFFERENTIAL/PLATELET
Abs Immature Granulocytes: 0.01 10*3/uL (ref 0.00–0.07)
Basophils Absolute: 0 10*3/uL (ref 0.0–0.1)
Basophils Relative: 1 %
Eosinophils Absolute: 0.5 10*3/uL (ref 0.0–0.5)
Eosinophils Relative: 7 %
HCT: 36.8 % (ref 36.0–46.0)
Hemoglobin: 11.7 g/dL — ABNORMAL LOW (ref 12.0–15.0)
Immature Granulocytes: 0 %
Lymphocytes Relative: 37 %
Lymphs Abs: 2.7 10*3/uL (ref 0.7–4.0)
MCH: 23.9 pg — ABNORMAL LOW (ref 26.0–34.0)
MCHC: 31.8 g/dL (ref 30.0–36.0)
MCV: 75.3 fL — ABNORMAL LOW (ref 80.0–100.0)
Monocytes Absolute: 0.4 10*3/uL (ref 0.1–1.0)
Monocytes Relative: 6 %
Neutro Abs: 3.6 10*3/uL (ref 1.7–7.7)
Neutrophils Relative %: 49 %
Platelets: 354 10*3/uL (ref 150–400)
RBC: 4.89 MIL/uL (ref 3.87–5.11)
RDW: 14.1 % (ref 11.5–15.5)
WBC: 7.4 10*3/uL (ref 4.0–10.5)
nRBC: 0 % (ref 0.0–0.2)

## 2021-02-02 LAB — COMPREHENSIVE METABOLIC PANEL
ALT: 25 U/L (ref 0–44)
AST: 25 U/L (ref 15–41)
Albumin: 4.4 g/dL (ref 3.5–5.0)
Alkaline Phosphatase: 42 U/L (ref 38–126)
Anion gap: 9 (ref 5–15)
BUN: 15 mg/dL (ref 6–20)
CO2: 25 mmol/L (ref 22–32)
Calcium: 9.2 mg/dL (ref 8.9–10.3)
Chloride: 104 mmol/L (ref 98–111)
Creatinine, Ser: 0.65 mg/dL (ref 0.44–1.00)
GFR, Estimated: >60 mL/min (ref >60)
Glucose, Bld: 108 mg/dL — ABNORMAL HIGH (ref 70–99)
Potassium: 3.9 mmol/L (ref 3.5–5.1)
Sodium: 138 mmol/L (ref 135–145)
Total Bilirubin: 0.5 mg/dL (ref 0.3–1.2)
Total Protein: 7.6 g/dL (ref 6.5–8.1)

## 2021-02-02 LAB — URINALYSIS, ROUTINE W REFLEX MICROSCOPIC
Bilirubin Urine: NEGATIVE
Glucose, UA: NEGATIVE mg/dL
Hgb urine dipstick: NEGATIVE
Ketones, ur: NEGATIVE mg/dL
Leukocytes,Ua: NEGATIVE
Nitrite: NEGATIVE
Protein, ur: NEGATIVE mg/dL
Specific Gravity, Urine: 1.01 (ref 1.005–1.030)
pH: 6.5 (ref 5.0–8.0)

## 2021-02-02 LAB — LIPASE, BLOOD: Lipase: 29 U/L (ref 11–51)

## 2021-02-02 LAB — TROPONIN I (HIGH SENSITIVITY): Troponin I (High Sensitivity): <2 ng/L (ref <18)

## 2021-02-02 LAB — PREGNANCY, URINE: Preg Test, Ur: NEGATIVE

## 2021-02-02 MED ORDER — ALUM & MAG HYDROXIDE-SIMETH 200-200-20 MG/5ML PO SUSP
30.0000 mL | Freq: Once | ORAL | Status: AC
  Administered 2021-02-02: 30 mL via ORAL
  Filled 2021-02-02: qty 30

## 2021-02-02 MED ORDER — LIDOCAINE VISCOUS HCL 2 % MT SOLN
15.0000 mL | Freq: Once | OROMUCOSAL | Status: AC
  Administered 2021-02-02: 15 mL via ORAL
  Filled 2021-02-02: qty 15

## 2021-02-02 MED ORDER — PANTOPRAZOLE SODIUM 40 MG IV SOLR
40.0000 mg | Freq: Once | INTRAVENOUS | Status: AC
  Administered 2021-02-02: 40 mg via INTRAVENOUS
  Filled 2021-02-02: qty 40

## 2021-02-02 MED ORDER — PANTOPRAZOLE SODIUM 20 MG PO TBEC: 40.0000 mg | DELAYED_RELEASE_TABLET | Freq: Every day | ORAL | 0 refills | Status: DC

## 2021-02-02 NOTE — ED Triage Notes (Signed)
Pt states she is having upper abd pain that started last night around 11pm  Denies N/V/D   Denies pain in her back

## 2021-02-02 NOTE — ED Provider Notes (Signed)
[facility] EMERGENCY DEPARTMENT Provider Note   CSN: [id number] Arrival date & time: 02/02/21  0630     History Chief Complaint  Patient presents with   Abdominal Pain    Rachel Clay is a 30 y.o. female.  HPI     30 year old female with history of asthma presents with concern for upper abdominal pain.  Reports a dull constant aching pain present in her epigastrium that began at 11 PM last night.  Reports it has been constant, and she is been waking up every 2 hours with pain.  Has been waxing and waning.  Unclear if anything makes it better or worse.  She had taken Tylenol PM prior to going to bed last night, and also reports she did have some wine last night but not significant amount.  She denies any associated nausea, vomiting, diarrhea or constipation.  She attempted to vomit just to see if it would help her feel better.  She does denies dysuria, fevers, chills.  No known sick contacts.  Had seafood last night.  No history of abdominal surgery.  No history of prior abdominal pain like this.   Past Medical History:  Diagnosis Date   Asthma    childhood   Eczema     Patient Active Problem List   Diagnosis Date Noted   Moderate persistent asthma, uncomplicated 10/12/2020   Allergy with anaphylaxis due to food, subsequent encounter 10/12/2020   Flexural atopic dermatitis 10/12/2020   Positive Anti-C antibody, antepartum 06/13/2012   Insufficient prenatal care 05/02/2012    Past Surgical History:  Procedure Laterality Date   NASAL SINUS SURGERY       OB History     Gravida  4   Para  2   Term  2   Preterm      AB      Living  2      SAB      IAB      Ectopic      Multiple      Live Births  2           Family History  Problem Relation Age of Onset   Hypertension Mother    Eczema Mother    Sleep apnea Daughter    Eczema Maternal Aunt    Allergic rhinitis Neg Hx    Angioedema Neg Hx    Asthma Neg Hx    Atopy Neg Hx     Immunodeficiency Neg Hx    Urticaria Neg Hx     Social History   Tobacco Use   Smoking status: Former    Types: Cigarettes    Quit date: 04/17/2020    Years since quitting: 0.7   Smokeless tobacco: Never  Vaping Use   Vaping Use: Never used  Substance Use Topics   Alcohol use: Yes    Comment: occ   Drug use: No    Home Medications Prior to Admission medications   Medication Sig Start Date End Date Taking? Authorizing Provider  pantoprazole (PROTONIX) 20 MG tablet Take 2 tablets (40 mg total) by mouth daily for 14 days. 02/02/21 02/16/21 Yes Alvira Monday, MD  azelastine (ASTELIN) 0.1 % nasal spray Place 1 spray into both nostrils 2 (two) times daily. Use in each nostril as directed Patient not taking: Reported on 10/28/2020 06/26/20   McVey, Madelaine Bhat, PA-C  EPINEPHrine 0.3 mg/0.3 mL IJ SOAJ injection Inject 0.3 mg into the muscle as needed for anaphylaxis. 10/14/20   Margo Aye  Elease HashimotoPatricia, MD  EUCRISA 2 % OINT APPLY TO AFFECTED AREA TWICE A DAY AS NEEDED 10/05/20   [provider]  fluticasone (FLONASE) 50 MCG/ACT nasal spray Place 2 sprays into both nostrils daily for 14 days. 04/05/20 04/19/20  Gailen ShelterFondaw, Wylder S, PA  fluticasone (FLOVENT HFA) 110 MCG/ACT inhaler 2 puffs twice daily with spacer to prevent coughing or wheezing. 10/28/20   Marcelyn BruinsPadgett, Shaylar Patricia, MD  ibuprofen (ADVIL) 600 MG tablet Take 1 tablet (600 mg total) by mouth every 8 (eight) hours as needed. 04/23/20   Olive BassMurray, Laura Woodruff, FNP  levalbuterol [facility](XOPENEX HFA) 45 MCG/ACT inhaler Inhale 2 puffs into the lungs every 6 (six) hours as needed for wheezing. 10/12/20   Marcelyn BruinsPadgett, Shaylar Patricia, MD  levalbuterol [facility](XOPENEX HFA) 45 MCG/ACT inhaler Inhale 2 puffs into the lungs every 6 (six) hours as needed for wheezing. 10/28/20   Marcelyn BruinsPadgett, Shaylar Patricia, MD  levalbuterol [facility](XOPENEX) 1.25 MG/3ML nebulizer solution Take 1.25 mg by nebulization every 4 (four) hours as needed for wheezing. 11/03/20   Marcelyn BruinsPadgett,  Shaylar Patricia, MD  levonorgestrel (MIRENA) 20 MCG/24HR IUD by Intrauterine route.    [provider]  NOREL AD 4-10-325 MG TABS Take 1 tablet by mouth 2 (two) times daily. Patient not taking: Reported on 10/28/2020 07/08/20   [provider]  Pseudoephedrine-Guaifenesin [facility](MUCINEX D PO) Take by mouth.    [provider]  triamcinolone (KENALOG) 0.1 % APPLY TO AFFECTED AREA TWICE DAILY AS NEEDED 08/16/19   [provider]    Allergies    Milk-related compounds and Other  Review of Systems   Review of Systems  Constitutional:  Negative for fever.  Eyes:  Negative for visual disturbance.  Respiratory:  Negative for cough and shortness of breath.   Cardiovascular:  Negative for chest pain.  Gastrointestinal:  Positive for abdominal pain. Negative for constipation, diarrhea, nausea and vomiting.  Genitourinary:  Negative for difficulty urinating.  Musculoskeletal:  Negative for back pain and neck pain.  Skin:  Negative for rash.  Neurological:  Negative for syncope and headaches.   Physical Exam Updated Vital Signs BP (!) 132/100 (BP Location: Right Arm)   Pulse 90   Temp 99.1 F (37.3 C) (Oral)   Resp 16   Ht 5\' 7"  (1.702 m)   Wt 113.4 kg   LMP 01/16/2021 (Approximate)   SpO2 100%   BMI 39.16 kg/m   Physical Exam Vitals and nursing note reviewed.  Constitutional:      General: She is not in acute distress.    Appearance: She is well-developed. She is not diaphoretic.  HENT:     Head: Normocephalic and atraumatic.  Eyes:     Conjunctiva/sclera: Conjunctivae normal.  Cardiovascular:     Rate and Rhythm: Normal rate and regular rhythm.     Heart sounds: Normal heart sounds. No murmur heard.   No friction rub. No gallop.  Pulmonary:     Effort: Pulmonary effort is normal. No respiratory distress.     Breath sounds: Normal breath sounds. No wheezing or rales.  Abdominal:     General: There is no distension.     Palpations: Abdomen is  soft.     Tenderness: There is abdominal tenderness in the epigastric area. There is no guarding. Negative signs include Murphy's sign and McBurney's sign.  Musculoskeletal:        General: No tenderness.     Cervical back: Normal range of motion.  Skin:    General: Skin is warm and dry.  Findings: No erythema or rash.  Neurological:     Mental Status: She is alert and oriented to person, place, and time.    ED Results / Procedures / Treatments   Labs (all labs ordered are listed, but only abnormal results are displayed) Labs Reviewed  CBC WITH DIFFERENTIAL/PLATELET - Abnormal; Notable for the following components:      Result Value   Hemoglobin 11.7 (*)    MCV 75.3 (*)    MCH 23.9 (*)    All other components within normal limits  COMPREHENSIVE METABOLIC PANEL - Abnormal; Notable for the following components:   Glucose, Bld 108 (*)    All other components within normal limits  URINALYSIS, ROUTINE W REFLEX MICROSCOPIC  PREGNANCY, URINE  LIPASE, BLOOD  TROPONIN I (HIGH SENSITIVITY)    EKG EKG Interpretation  Date/Time:  Tuesday February 02 2021 07:56:57 EDT Ventricular Rate:  75 PR Interval:  167 QRS Duration: 86 QT Interval:  383 QTC Calculation: 428 R Axis:   40 Text Interpretation: Sinus rhythm Borderline T wave abnormalities Borderline ST elevation, lateral leads No significant change since last tracing Confirmed by Alvira Monday ([id number]) on 02/02/2021 8:01:15 AM  Radiology No results found.  Procedures Procedures   Medications Ordered in ED Medications  pantoprazole (PROTONIX) injection 40 mg (40 mg Intravenous Given 02/02/21 0744)  alum & mag hydroxide-simeth (MAALOX/MYLANTA) 200-200-20 MG/5ML suspension 30 mL (30 mLs Oral Given 02/02/21 0741)    And  lidocaine (XYLOCAINE) 2 % viscous mouth solution 15 mL (15 mLs Oral Given 02/02/21 0741)    ED Course  I have reviewed the triage vital signs and the nursing notes.  Pertinent labs & imaging results that were  available during my care of the patient were reviewed by me and considered in my medical decision making (see chart for details).    MDM Rules/Calculators/A&P                          30 year old female with history of asthma presents with concern for upper abdominal pain. DDx includes appendicitis, pancreatitis, cholecystitis, pyelonephritis, nephrolithiasis, diverticulitis, PID, ovarian torsion, ectopic pregnancy, and tuboovarian abscess, ACS.  Does not have lower abdominal pain or tenderness, and do not feel history and exam are consistent with PID, ovarian torsion, ectopic pregnancy, nephrolithiasis, appendicitis, diverticulitis.  Urinalysis shows no sign of urinary tract infection.  Pregnancy test is negative.  CMP shows no sign of hepatitis.  Her lipase is within normal limits and doubt pancreatitis.  EKG was evaluated showing no acute abnormalities-TW inversions noted in lead III the same as previous ECG.  Troponin negative.  She does not have right upper quadrant tenderness, has a negative Murphy sign, is afebrile, without leukocytosis, and do not feel her history or her exam are consistent with cholecystitis.  Location of her tenderness is not over the gallbladder, but is more epigastric, suspect more likely consistent with peptic ulcer disease or gastritis.  Given GI cocktail, Protonix in the emergency department.  Given prescription for Protonix, recommend PCP follow-up and possible GI referral if symptoms continuing, or return to the emergency department if worsening pain, fever, vomiting. Patient discharged in stable condition with understanding of reasons to return.    Final Clinical Impression(s) / ED Diagnoses Final diagnoses:  Epigastric abdominal pain  Acute gastritis without hemorrhage, unspecified gastritis type    Rx / DC Orders ED Discharge Orders          Ordered    pantoprazole (  PROTONIX) 20 MG tablet  Daily        02/02/21 0754             Alvira Monday,  MD 02/02/21 [id number]

## 2021-03-01 ENCOUNTER — Ambulatory Visit: Payer: Medicaid Other | Admitting: Allergy

## 2021-03-31 ENCOUNTER — Ambulatory Visit: Payer: Medicaid Other | Admitting: Allergy

## 2021-04-21 ENCOUNTER — Telehealth: Payer: Medicaid Other | Admitting: Physician Assistant

## 2021-04-21 ENCOUNTER — Ambulatory Visit: Payer: Medicaid Other | Admitting: Family Medicine

## 2021-04-21 DIAGNOSIS — B9789 Other viral agents as the cause of diseases classified elsewhere: Secondary | ICD-10-CM

## 2021-04-21 DIAGNOSIS — J329 Chronic sinusitis, unspecified: Secondary | ICD-10-CM | POA: Diagnosis not present

## 2021-04-21 MED ORDER — AZELASTINE HCL 0.1 % NA SOLN: 1.0000 | Freq: Two times a day (BID) | NASAL | 0 refills | Status: DC

## 2021-04-21 NOTE — Progress Notes (Signed)
I have spent 5 minutes in review of e-visit questionnaire, review and updating patient chart, medical decision making and response to patient.   Verity Gilcrest Cody Mosetta Ferdinand, PA-C    

## 2021-04-21 NOTE — Progress Notes (Signed)
E-Visit for Sinus Problems  We are sorry that you are not feeling well.  Here is how we plan to help!  Based on what you have shared with me it looks like you have sinusitis.  Sinusitis is inflammation and infection in the sinus cavities of the head.  Based on your presentation I believe you most likely have Acute Viral Sinusitis.This is an infection most likely caused by a virus. I am concerned giving change in taste/smell that you could have milder case of COVID with an initial false-negative test. This can happen a lot, especially with rapid testing if testing within first couple of days of symptoms. I think it is reasonable since it has been a few days, for you to retest just to make sure. This can be an at-home test or a PCR test done at pharmacies. You should quarantine until results are coming in. If positive -- please let us know ASAP so we can make further adjustments.   For other run-of-the-mill causes of viral sinusitis --There is not specific treatment for viral sinusitis other than to help you with the symptoms until the infection runs its course.  You may use an oral decongestant such as Mucinex D or if you have glaucoma or high blood pressure use plain Mucinex. Saline nasal spray help and can safely be used as often as needed for congestion. Please continue the nasal spray you were given -- Flonase. Along with this I want you to take Astelin spray as well as they work in two different ways to calm nasal symptoms down.   Some authorities believe that zinc sprays or the use of Echinacea may shorten the course of your symptoms.  Sinus infections are not as easily transmitted as other respiratory infection, however we still recommend that you avoid close contact with loved ones, especially the very young and elderly.  Remember to wash your hands thoroughly throughout the day as this is the number one way to prevent the spread of infection!  Home Care: Only take medications as instructed by  your medical team. Do not take these medications with alcohol. A steam or ultrasonic humidifier can help congestion.  You can place a towel over your head and breathe in the steam from hot water coming from a faucet. Avoid close contacts especially the very young and the elderly. Cover your mouth when you cough or sneeze. Always remember to wash your hands.  Get Help Right Away If: You develop worsening fever or sinus pain. You develop a severe head ache or visual changes. Your symptoms persist after you have completed your treatment plan.  Make sure you Understand these instructions. Will watch your condition. Will get help right away if you are not doing well or get worse.   Thank you for choosing an e-visit.  Your e-visit answers were reviewed by a board certified advanced clinical practitioner to complete your personal care plan. Depending upon the condition, your plan could have included both over the counter or prescription medications.  Please review your pharmacy choice. Make sure the pharmacy is open so you can pick up prescription now. If there is a problem, you may contact your provider through [employer] and have the prescription routed to another pharmacy.  Your safety is important to [place]. If you have drug allergies check your prescription carefully.   For the next 24 hours you can use MyChart to ask questions about today's visit, request a non-urgent call back, or ask for a work or school  excuse. You will get an email in the next two days asking about your experience. I hope that your e-visit has been valuable and will speed your recovery.

## 2021-04-21 NOTE — Progress Notes (Deleted)
   [address] [place] [address] Dept: [phone]  FOLLOW UP NOTE  Patient ID: Rachel Clay, female    DOB: Sep 26, 1990  Age: 30 y.o. MRN: [id number] Date of Office Visit: 04/21/2021  Assessment  Chief Complaint: No chief complaint on file.  HPI Robyn Haber    Drug Allergies:  Allergies  Allergen Reactions   Milk-Related Compounds Shortness Of Breath    dairy   Other Shortness Of Breath    dairy    Physical Exam: There were no vitals taken for this visit.   Physical Exam  Diagnostics:    Assessment and Plan: No diagnosis found.  No orders of the defined types were placed in this encounter.   There are no Patient Instructions on file for this visit.  No follow-ups on file.    Thank you for the opportunity to care for this patient.  Please do not hesitate to contact me with questions.  Thermon Leyland, FNP [facility]

## 2021-05-17 NOTE — Progress Notes (Deleted)
   [address] [place] [address] Dept: [phone]  FOLLOW UP NOTE  Patient ID: Rachel Clay, female    DOB: 1990-09-11  Age: 30 y.o. MRN: [id number] Date of Office Visit: 05/18/2021  Assessment  Chief Complaint: No chief complaint on file.  HPI Rachel Clay is a 30 year old female who presents to the clinic for a follow up visit. She was last seen in the clinic on 10/29/2019 by Dr. Delorse Lek for evaluation of asthma, atopic dermatitis, reflux, and reflux.   She has followed up with cards and pulm.    Drug Allergies:  Allergies  Allergen Reactions   Milk-Related Compounds Shortness Of Breath    dairy   Other Shortness Of Breath    dairy    Physical Exam: There were no vitals taken for this visit.   Physical Exam  Diagnostics:    Assessment and Plan: No diagnosis found.  No orders of the defined types were placed in this encounter.   There are no Patient Instructions on file for this visit.  No follow-ups on file.    Thank you for the opportunity to care for this patient.  Please do not hesitate to contact me with questions.  Thermon Leyland, FNP [facility]

## 2021-05-17 NOTE — Patient Instructions (Incomplete)
Asthma Continue Flovent 110-2 puffs twice a day with a spacer to prevent cough or wheeze Continue Xopenex 2 puffs once every 4-6 hours as needed for cough or wheeze You may use Xopenex 2 puffs 5 to 15 minutes before activity to decrease cough or wheeze  Chronic rhinitis Continue Flonase 2 sprays in each nostril once a day as needed for stuffy nose Consider saline nasal rinses as needed for nasal symptoms. Use this before any medicated nasal sprays for best result  Atopic dermatitis Continue a daily moisturizing routine You may use Eucrisa on red itchy areas up to twice a day as needed For stubborn red itchy areas below your face, use triamcinolone 0.1% ointment up to twice a day as needed.  Do not use this medication for longer than 2 weeks in a row.  Food allergy Continue to avoid milk in all forms. In case of an allergic reaction, take Benadryl *** {Blank single:19197::"teaspoonful","teaspoonfuls","capsules"} every {blank single:19197::"4","6"} hours, and if life-threatening symptoms occur, inject with {Blank single:19197::"EpiPen 0.3 mg","EpiPen Jr. 0.15 mg","AuviQ 0.3 mg","AuviQ 0.15 mg","AuviQ 0.10 mg"}.  Reflux Continue dietary and lifestyle modifications as listed below Continue pantoprazole as previously prescribed  Call the clinic if this treatment plan is not working well for you.  Follow up in *** or sooner if needed.

## 2021-05-18 ENCOUNTER — Ambulatory Visit: Payer: Medicaid Other | Admitting: Family Medicine

## 2021-06-01 ENCOUNTER — Ambulatory Visit: Payer: Medicaid Other | Admitting: Internal Medicine

## 2021-06-04 ENCOUNTER — Telehealth: Payer: Medicaid Other | Admitting: Physician Assistant

## 2021-06-04 DIAGNOSIS — S76912A Strain of unspecified muscles, fascia and tendons at thigh level, left thigh, initial encounter: Secondary | ICD-10-CM | POA: Diagnosis not present

## 2021-06-04 MED ORDER — CYCLOBENZAPRINE HCL 10 MG PO TABS: 5.0000 mg | ORAL_TABLET | Freq: Three times a day (TID) | ORAL | 0 refills | Status: DC | PRN

## 2021-06-04 MED ORDER — NAPROXEN 500 MG PO TABS: 500.0000 mg | ORAL_TABLET | Freq: Two times a day (BID) | ORAL | 0 refills | Status: DC

## 2021-06-04 NOTE — Progress Notes (Signed)
We are sorry that you are not feeling well.  Here is how we plan to help!  Based on what you have shared with me it looks like you mostly have an acute muscle strain.  I have prescribed Naprosyn 500 mg take one by mouth twice a day non-steroid anti-inflammatory (NSAID) as well as Flexeril 10 mg every eight hours as needed which is a muscle relaxer  Some patients experience stomach irritation or in increased heartburn with anti-inflammatory drugs.  Please keep in mind that muscle relaxer's can cause fatigue and should not be taken while at work or driving.  Back pain is very common.  The pain often gets better over time.  The cause of back pain is usually not dangerous.  Most people can learn to manage their back pain on their own.  Home Care Stay active.  Start with short walks on flat ground if you can.  Try to walk farther each day. Do not sit, drive or stand in one place for more than 30 minutes.  Do not stay in bed. Do not avoid exercise or work.  Activity can help your back heal faster. Be careful when you bend or lift an object.  Bend at your knees, keep the object close to you, and do not twist. Sleep on a firm mattress.  Lie on your side, and bend your knees.  If you lie on your back, put a pillow under your knees. Only take medicines as told by your doctor. Put ice on the injured area. Put ice in a plastic bag Place a towel between your skin and the bag Leave the ice on for 15-20 minutes, 3-4 times a day for the first 2-3 days. 210 After that, you can switch between ice and heat packs. Ask your doctor about back exercises or massage. Avoid feeling anxious or stressed.  Find good ways to deal with stress, such as exercise.  Get Help Right Way If: Your pain does not go away with rest or medicine. Your pain does not go away in 1 week. You have new problems. You do not feel well. The pain spreads into your legs. You cannot control when you poop (bowel movement) or pee (urinate) You  feel sick to your stomach (nauseous) or throw up (vomit) You have belly (abdominal) pain. You feel like you may pass out (faint). If you develop a fever.  Make Sure you: Understand these instructions. Will watch your condition Will get help right away if you are not doing well or get worse.  Your e-visit answers were reviewed by a board certified advanced clinical practitioner to complete your personal care plan.  Depending on the condition, your plan could have included both over the counter or prescription medications.  If there is a problem please reply  once you have received a response from your provider.  Your safety is important to [place].  If you have drug allergies check your prescription carefully.    You can use MyChart to ask questions about today's visit, request a non-urgent call back, or ask for a work or school excuse for 24 hours related to this e-Visit. If it has been greater than 24 hours you will need to follow up with your provider, or enter a new e-Visit to address those concerns.  You will get an e-mail in the next two days asking about your experience.  I hope that your e-visit has been valuable and will speed your recovery. Thank you for using e-visits.  I  provided 5 minutes of non face-to-face time during this encounter for chart review and documentation.

## 2021-06-15 ENCOUNTER — Ambulatory Visit (INDEPENDENT_AMBULATORY_CARE_PROVIDER_SITE_OTHER): Payer: Medicaid Other | Admitting: Internal Medicine

## 2021-06-15 ENCOUNTER — Encounter: Payer: Self-pay | Admitting: Internal Medicine

## 2021-06-15 ENCOUNTER — Other Ambulatory Visit: Payer: Self-pay

## 2021-06-15 VITALS — BP 128/84 | HR 85 | Resp 18 | Ht 68.25 in | Wt 256.0 lb

## 2021-06-15 DIAGNOSIS — J4540 Moderate persistent asthma, uncomplicated: Secondary | ICD-10-CM

## 2021-06-15 DIAGNOSIS — L2089 Other atopic dermatitis: Secondary | ICD-10-CM | POA: Diagnosis not present

## 2021-06-15 DIAGNOSIS — T7800XD Anaphylactic reaction due to unspecified food, subsequent encounter: Secondary | ICD-10-CM

## 2021-06-15 MED ORDER — MONTELUKAST SODIUM 10 MG PO TABS: 10.0000 mg | ORAL_TABLET | Freq: Every day | ORAL | 5 refills | Status: DC

## 2021-06-15 NOTE — Patient Instructions (Addendum)
Mild Intermittent Asthma: well controlled - Breathing test today showed: looked great today! - Based on symptoms and breathing tests your asthma is well controlled and we don't need to make any changes   PLAN: . - Daily controller medication(s): None currently  - Prior to physical activity: albuterol 2 puffs 10-15 minutes before physical activity. - Rescue medications: albuterol 4 puffs every 4-6 hours as needed - Changes during respiratory infections or worsening symptoms: Add on Flovent to 2 puffs twice daily for TWO WEEKS. - Get Influenza Vaccine and appropriate Pneumonia and COVID 19 boosters  - Asthma control goals:  * Full participation in all desired activities (may need albuterol before activity) * Albuterol use two time or less a week on average (not counting use with activity) * Cough interfering with sleep two time or less a month * Oral steroids no more than once a year * No hospitalizations  Allergic Rhinitis  - Continue with: Claritin 10mg  daily  - Start taking: Singulair 10 mg daily  - You can use an extra dose of the antihistamine, if needed, for breakthrough symptoms.  - Consider nasal saline rinses 1-2 times daily to remove allergens from the nasal cavities as well as help with mucous clearance (this is especially helpful to do before the nasal sprays are given) - Consider allergy shots as a means of long-term control and can reduce lifetime use of medications  - Allergy shots "re-train" and "reset" the immune system to ignore environmental allergens and decrease the resulting immune response to those allergens (sneezing, itchy watery eyes, runny nose, nasal congestion, etc).    - Allergy shots improve symptoms in 75-85%  - Allergy shots are the only potential permanent and disease modifying option  - We can discuss more at the next appointment if the medications are not working for you.  Atopic Dermatitis -well controlled  -Continue basic skin care  -Continue  emollient therapy with vaseline   Allergy with anaphylaxis due to food, subsequent encounter - Continue avoidance of all milk products to include baked milk  - Continue to carry epinephrine on you at all times  - Follow food allergy action plans for any reactions   Follow up in 6 months   Thank you so much for letter me partake in your care today.  Don't hesitate to reach out if you have any additional concerns!  10-05-1995, MD  [facility] [place] [place]

## 2021-06-15 NOTE — Progress Notes (Signed)
FOLLOW UP Date of Service/Encounter:  06/15/21   Subjective:  Rachel Clay (DOB: 1991-06-03) is a 30 y.o. female who [facility] in re-evaluation of the following: moderate persistent asthma, atopic dermatitis, food allergy History obtained from: chart review and patient.  For Review, LV was on 10/28/2020  with Dr. Delorse Lek seen for moderate persistent asthma, atopic dermatitis, food allergy   Today is a regular follow up visit.  1) Asthma:  -Reports: 0 per week daytime symptoms in past month, 0 per week nighttime awakenings in past month,  -Currently using rescue inhaler rare  -Limitations to daily activity: none - 0 ED visit, 0 UC visits 0 hospitalizations since last visit - 0 oral steroids and 0 antibiotics for airway illness since last visit. -Up-to-date with pneumonia and Covid-19 vaccines. Is not interested in flu vaccine  -Smoking exposure: Denies, quit cigarettes 1 year ago  -Today's Asthma Control Test:  .   -Current Regimen: Flovent 2 puffs twice a day - stopped during the summer  -Previous FEV1 % 2.58  at last visit -Total corticosteroid use in past year 40mg  - 4/22 injection  -Adverse effects of medications :  Adverse response - to albuterol  -Dexa and Cataract Screening: not indicated  2)  Rhinitis - Reports increase in sneezing, rhinorrhea, nasal congestion over the past 6 months.  No animal triggers, Dust is known trigger. Last allergy testing in middle school. Nasal sprays irritate nose.  Interested new medications for treatment that don't cause drowsiness.   3) Atopic dermatitis: flares mostly elbows, neck, current regimen dove soap and vaseline for flares, reports avoidance of fragrance/dye free products,  sleep is not affected.    Allergies as of 06/15/2021       Reactions   Milk-related Compounds Shortness Of Breath   dairy   Other Shortness Of Breath   dairy        Medication List        Accurate as  of June 15, 2021 11:44 AM. If you have any questions, ask your nurse or doctor.          STOP taking these medications    Norel AD 4-10-325 MG Tabs Generic drug: Chlorphen-PE-Acetaminophen Stopped by: 04-19-1997, MD   pantoprazole 20 MG tablet Commonly known as: PROTONIX Stopped by: Ferol Luz, MD   Victoza 18 MG/3ML Sopn Generic drug: liraglutide Stopped by: Ferol Luz, MD       TAKE these medications    azelastine 0.1 % nasal spray Commonly known as: ASTELIN Place 1 spray into both nostrils 2 (two) times daily. Use in each nostril as directed   cyclobenzaprine 10 MG tablet Commonly known as: FLEXERIL Take 0.5-1 tablets (5-10 mg total) by mouth 3 (three) times daily as needed for muscle spasms.   EPINEPHrine 0.3 mg/0.3 mL Soaj injection Commonly known as: EPI-PEN Inject 0.3 mg into the muscle as needed for anaphylaxis.   Eucrisa 2 % Oint Generic drug: Crisaborole APPLY TO AFFECTED AREA TWICE A DAY AS NEEDED   Flovent HFA 110 MCG/ACT inhaler Generic drug: fluticasone 2 puffs twice daily with spacer to prevent coughing or wheezing.   fluticasone 50 MCG/ACT nasal spray Commonly known as: FLONASE Place 2 sprays into both nostrils daily for 14 days.   ibuprofen 600 MG tablet Commonly known as: ADVIL Take 1 tablet (600 mg total) by mouth every 8 (eight) hours as needed.   levalbuterol 1.25 MG/3ML nebulizer solution Commonly known as: XOPENEX Take  1.25 mg by nebulization every 4 (four) hours as needed for wheezing.   levalbuterol 45 MCG/ACT inhaler Commonly known as: Xopenex HFA Inhale 2 puffs into the lungs every 6 (six) hours as needed for wheezing.   levalbuterol 45 MCG/ACT inhaler Commonly known as: Xopenex HFA Inhale 2 puffs into the lungs every 6 (six) hours as needed for wheezing.   levonorgestrel 20 MCG/24HR IUD Commonly known as: MIRENA by Intrauterine route.   metFORMIN 500 MG 24 hr tablet Commonly known as:  GLUCOPHAGE-XR Take 500 mg by mouth 2 (two) times daily.   metoprolol succinate 25 MG 24 hr tablet Commonly known as: TOPROL-XL Take 12.5 mg by mouth daily.   MUCINEX D PO Take by mouth.   naproxen 500 MG tablet Commonly known as: NAPROSYN Take 1 tablet (500 mg total) by mouth 2 (two) times daily with a meal.   triamcinolone cream 0.1 % Commonly known as: KENALOG APPLY TO AFFECTED AREA TWICE DAILY AS NEEDED       Past Medical History:  Diagnosis Date   Asthma    childhood   Eczema    Past Surgical History:  Procedure Laterality Date   NASAL SINUS SURGERY     Otherwise, there have been no changes to her past medical history, surgical history, family history, or social history.  ROS: All others negative except as noted per HPI.   Objective:  BP 128/84   Pulse 85   Resp 18   Ht 5' 8.25" (1.734 m)   Wt 256 lb (116.1 kg)   SpO2 96%   BMI 38.64 kg/m  Body mass index is 38.64 kg/m. Physical Exam: General Appearance:  Alert, cooperative, no distress, appears stated age  Head:  Normocephalic, without obvious abnormality, atraumatic  HEENT  Conjunctiva clear, EOM's intact, TM- intact bilaterally, nasal mucosa pale without rhinnorhea,    Nasal polyposis not noted on limited external exam   Throat: Lips, tongue normal; teeth and gums normal, oral mucosa normal without exudates, posterior pharyngeal cobblestoning not noted  Neck: Supple, symmetrical  Lungs:   Respirations unlabored, no coughing, Breath Sounds bilaterally, no wheeze, crackles or rales  Heart:  Appears well perfused, S1 S2 normal, no murmurs, rubs or gallops, regular rate or rhythm  Extremities: No edema  Skin: Skin color, texture, turgor normal, no rashes or lesions on visualized portions of skin  Neurologic: No gross deficits   Reviewed: Previous allergy encounters, testing, PFTS, allergy pertinent laboratory and radiographic data   I reviewed her past medical history, social history, family history,  and environmental history and no significant changes have been reported from her previous visit.  Spirometry:  Tracings reviewed. Her effort: Good reproducible efforts. FVC: 3.10L FEV1: 2.50L, 80% predicted FEV1/FVC ratio: 81% Interpretation: Spirometry consistent with normal pattern.  Please see scanned spirometry results for details.  Assessment:  Moderate persistent asthma, uncomplicated  Flexural atopic dermatitis  Allergy with anaphylaxis due to food, subsequent encounter  Plan/Recommendations:   Patient Instructions  Mild Intermittent Asthma: well controlled - Breathing test today showed: looked great today! - Based on symptoms and breathing tests your asthma is well controlled and we don't need to make any changes   PLAN: . - Daily controller medication(s): None currently  - Prior to physical activity: albuterol 2 puffs 10-15 minutes before physical activity. - Rescue medications: albuterol 4 puffs every 4-6 hours as needed - Changes during respiratory infections or worsening symptoms: Add on Flovent to 2 puffs twice daily for TWO WEEKS. - Get  Influenza Vaccine and appropriate Pneumonia and COVID 19 boosters  - Asthma control goals:  * Full participation in all desired activities (may need albuterol before activity) * Albuterol use two time or less a week on average (not counting use with activity) * Cough interfering with sleep two time or less a month * Oral steroids no more than once a year * No hospitalizations  Allergic Rhinitis  - Continue with: Claritin 10mg  daily  - Start taking: Singulair 10 mg daily  - You can use an extra dose of the antihistamine, if needed, for breakthrough symptoms.  - Consider nasal saline rinses 1-2 times daily to remove allergens from the nasal cavities as well as help with mucous clearance (this is especially helpful to do before the nasal sprays are given) - Consider allergy shots as a means of long-term control and can  reduce lifetime use of medications  - Allergy shots "re-train" and "reset" the immune system to ignore environmental allergens and decrease the resulting immune response to those allergens (sneezing, itchy watery eyes, runny nose, nasal congestion, etc).    - Allergy shots improve symptoms in 75-85%  - Allergy shots are the only potential permanent and disease modifying option  - We can discuss more at the next appointment if the medications are not working for you.  Atopic Dermatitis -well controlled  -Continue basic skin care  -Continue emollient therapy with vaseline   Allergy with anaphylaxis due to food, subsequent encounter - Continue avoidance of all milk products to include baked milk  - Continue to carry epinephrine on you at all times  - Follow food allergy action plans for any reactions   Follow up in 6 months   Thank you so much for letter me partake in your care today.  Don't hesitate to reach out if you have any additional concerns!  10-05-1995, MD  [facility] [place], [place]

## 2021-08-10 ENCOUNTER — Telehealth: Payer: Medicaid Other | Admitting: Physician Assistant

## 2021-08-10 DIAGNOSIS — S39012A Strain of muscle, fascia and tendon of lower back, initial encounter: Secondary | ICD-10-CM | POA: Diagnosis not present

## 2021-08-10 MED ORDER — CYCLOBENZAPRINE HCL 10 MG PO TABS: 10.0000 mg | ORAL_TABLET | Freq: Three times a day (TID) | ORAL | 0 refills | Status: DC | PRN

## 2021-08-10 MED ORDER — NAPROXEN 500 MG PO TABS: 500.0000 mg | ORAL_TABLET | Freq: Two times a day (BID) | ORAL | 0 refills | Status: DC

## 2021-08-10 NOTE — Progress Notes (Signed)

## 2021-09-07 ENCOUNTER — Telehealth: Payer: Medicaid Other | Admitting: Physician Assistant

## 2021-09-07 DIAGNOSIS — J069 Acute upper respiratory infection, unspecified: Secondary | ICD-10-CM

## 2021-09-07 MED ORDER — IPRATROPIUM BROMIDE 0.03 % NA SOLN: 2.0000 | Freq: Two times a day (BID) | NASAL | 0 refills | Status: DC

## 2021-09-07 MED ORDER — BENZONATATE 100 MG PO CAPS
100.0000 mg | ORAL_CAPSULE | Freq: Three times a day (TID) | ORAL | 0 refills | Status: DC | PRN
Start: 2021-09-07 — End: 2021-12-17

## 2021-09-07 NOTE — Progress Notes (Signed)

## 2021-09-08 ENCOUNTER — Telehealth: Payer: Medicaid Other | Admitting: Physician Assistant

## 2021-09-08 ENCOUNTER — Telehealth: Payer: Medicaid Other | Admitting: Family

## 2021-09-08 DIAGNOSIS — U071 COVID-19: Secondary | ICD-10-CM | POA: Diagnosis not present

## 2021-09-08 MED ORDER — NIRMATRELVIR/RITONAVIR (PAXLOVID)TABLET: 3.0000 | ORAL_TABLET | Freq: Two times a day (BID) | ORAL | 0 refills | Status: AC

## 2021-09-08 NOTE — Progress Notes (Signed)
Based on what you shared with me, I feel your condition warrants further evaluation and I recommend that you be seen in a face to face visit.  You need to do a video visit to get antivirals for COVID. We can not prescribe these through an Evisits.    NOTE: There will be NO CHARGE for this eVisit   If you are having a true medical emergency please call 911.      For an urgent face to face visit, [facility] has six urgent care centers for your convenience:     [facility] [place] Get Driving Directions [id number] [place], [place] [address]    [facility]) Get Driving Directions [id number] [place], [place] [address]  [facility] [place] [facility] Get Driving Directions [id number] [address] [place],  [place]  [address]  [facility] Get Driving Directions [id number] [address] [place], [place], [place] [address]   [facility] Get Driving Directions  [id number] [address] [place], [place] [address]   [facility] [place] Get Driving Directions [id number] [address] [place], [place] [address]  Your MyChart E-visit questionnaire answers were reviewed by a board certified advanced clinical practitioner to complete your personal care plan based on your specific symptoms.  Thank you for using e-Visits.

## 2021-09-08 NOTE — Progress Notes (Signed)
Virtual Visit Consent   [name], you are scheduled for a virtual visit with a [facility] provider today.     Just as with appointments in the office, your consent must be obtained to participate.  Your consent will be active for this visit and any virtual visit you may have with one of our providers in the next 365 days.     If you have a MyChart account, a copy of this consent can be sent to you electronically.  All virtual visits are billed to your insurance company just like a traditional visit in the office.    As this is a virtual visit, video technology does not allow for your provider to perform a traditional examination.  This may limit your provider's ability to fully assess your condition.  If your provider identifies any concerns that need to be evaluated in person or the need to arrange testing (such as labs, EKG, etc.), we will make arrangements to do so.     Although advances in technology are sophisticated, we cannot ensure that it will always work on either your end or our end.  If the connection with a video visit is poor, the visit may have to be switched to a telephone visit.  With either a video or telephone visit, we are not always able to ensure that we have a secure connection.     I need to obtain your verbal consent now.   Are you willing to proceed with your visit today?    Rachel Clay has provided verbal consent on 09/08/2021 for a virtual visit (video or telephone).   [name], [place]   Date: 09/08/2021 1:37 PM   Virtual Visit via Video Note   I, [name], connected with  Aradhana Gin  ([id number], 1991-05-20) on 09/08/21 at  1:30 PM EST by a video-enabled telemedicine application and verified that I am speaking with the correct person using two identifiers.  Location: Patient: Virtual Visit Location Patient: Home Provider: Virtual Visit Location Provider: Home Office   I discussed the limitations of evaluation and management by  telemedicine and the availability of in person appointments. The patient expressed understanding and agreed to proceed.    History of Present Illness: Rachel Clay is a 31 y.o. who identifies as a female who was assigned female at birth, and is being seen today for COVID-19. Symptoms starting Sunday evening and were mild at first with just a tickle in the throat and some head congestion. Since that time symptoms have continued to progress. Notes chest congestion, sore throat, headache, aches, chills and now a fever (started today - Tmax 101). Denies chest pain or SOB. Would like to discuss antivirals.    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 (Take nirmatrelvir 150 mg two tablets twice daily for 5 days and ritonavir 100 mg one tablet twice  daily for 5 days) Patient GFR is >90, Disp: 30 tablet, Rfl: 0   azelastine (ASTELIN) 0.1 % nasal spray, Place 1 spray into both nostrils 2 (two) times daily. Use in each nostril as directed (Patient not taking: Reported on 06/15/2021), Disp: 30 mL, Rfl: 0   benzonatate (TESSALON) 100 MG capsule, Take 1 capsule (100 mg total) by mouth 3 (three) times daily as needed., Disp: 30 capsule, Rfl: 0   cyclobenzaprine (FLEXERIL) 10 MG tablet, Take 1 tablet (10 mg total) by mouth 3 (three) times daily as needed for muscle spasms., Disp: 30 tablet, Rfl: 0   EPINEPHrine 0.3  mg/0.3 mL IJ SOAJ injection, Inject 0.3 mg into the muscle as needed for anaphylaxis., Disp: 2 each, Rfl: 1   EUCRISA 2 % OINT, APPLY TO AFFECTED AREA TWICE A DAY AS NEEDED, Disp: , Rfl:    fluticasone (FLONASE) 50 MCG/ACT nasal spray, Place 2 sprays into both nostrils daily for 14 days., Disp: 11.1 mL, Rfl: 0   fluticasone (FLOVENT HFA) 110 MCG/ACT inhaler, 2 puffs twice daily with spacer to prevent coughing or wheezing., Disp: 1 each, Rfl: 5   ibuprofen (ADVIL) 600 MG tablet, Take 1 tablet (600 mg total) by mouth every 8 (eight) hours as needed., Disp: 20 tablet, Rfl: 0   ipratropium (ATROVENT) 0.03 % nasal spray, Place 2 sprays into both nostrils every 12 (twelve) hours., Disp: 30 mL, Rfl: 0   levalbuterol (XOPENEX HFA) 45 MCG/ACT inhaler, Inhale 2 puffs into the lungs every 6 (six) hours as needed for wheezing., Disp: 1 each, Rfl: 1   levalbuterol (XOPENEX HFA) 45 MCG/ACT inhaler, Inhale 2 puffs into the lungs every 6 (six) hours as needed for wheezing., Disp: 1 each, Rfl: 1   levalbuterol (XOPENEX) 1.25 MG/3ML nebulizer solution, Take 1.25 mg by nebulization every 4 (four) hours as needed for wheezing., Disp: 72 mL, Rfl: 5   levonorgestrel (MIRENA) 20 MCG/24HR IUD, by Intrauterine route., Disp: , Rfl:    metFORMIN (GLUCOPHAGE-XR) 500 MG 24 hr tablet, Take 500 mg by mouth 2 (two) times daily., Disp: , Rfl:    metoprolol succinate (TOPROL-XL) 25 MG 24 hr tablet, Take 12.5 mg by mouth daily., Disp: , Rfl:    montelukast (SINGULAIR) 10 MG tablet, Take 1 tablet (10 mg total) by mouth at bedtime., Disp: 30 tablet, Rfl: 5   naproxen (NAPROSYN) 500 MG tablet, Take 1 tablet (500 mg total) by mouth 2 (two) times daily with a meal., Disp: 30 tablet, Rfl: 0   Pseudoephedrine-Guaifenesin (MUCINEX D PO), Take by mouth. (Patient not taking: Reported on 06/15/2021), Disp: , Rfl:    triamcinolone (KENALOG) 0.1 %, APPLY TO AFFECTED AREA TWICE DAILY AS NEEDED, Disp: , Rfl:   Observations/Objective: Patient is  well-developed, well-nourished in no acute distress.  Resting comfortably at home.  Head is normocephalic, atraumatic.  No labored breathing. Speech is clear and coherent with logical content.  Patient is alert and oriented at baseline.   Assessment and Plan: 1. COVID-19 - MyChart COVID-19 home monitoring program; Future - nirmatrelvir/ritonavir EUA (PAXLOVID) 20 x 150 MG & 10 x 100MG  TABS; Take 3 tablets by mouth 2 (two) times daily for 5 days. (Take nirmatrelvir 150 mg two tablets twice daily for 5 days and ritonavir 100 mg one tablet twice daily for 5 days) Patient GFR is >90  Dispense: 30 tablet; Refill: 0  Patient with multiple risk factors for complicated course of illness. Discussed risks/benefits of antiviral medications including most common potential ADRs. Patient voiced  understanding and would like to proceed with antiviral medication. They are candidate for Paxlovid. GFR > 90 at last check in past 60 days per EMR. Rx sent to pharmacy. Supportive measures, OTC medications and vitamin regimen reviewed. Continue medications given at [facility] previously for symptom control. Patient has been enrolled in a MyChart COVID symptom monitoring program. Anne Shutter reviewed in detail. Strict ER precautions discussed with patient.    Follow Up Instructions: I discussed the assessment and treatment plan with the patient. The patient was provided an opportunity to ask questions and all were answered. The patient agreed with the plan and demonstrated an understanding of the instructions.  A copy of instructions were sent to the patient via MyChart unless otherwise noted below.    The patient was advised to call back or seek an in-person evaluation if the symptoms worsen or if the condition fails to improve as anticipated.  Time:  I spent 12 minutes with the patient via telehealth technology discussing the above problems/concerns.    [name], PA-C

## 2021-09-08 NOTE — Patient Instructions (Signed)
Rachel Haberwana Granberry, thank you for joining Piedad ClimesWilliam Cody Holli Rengel, PA-C for today's virtual visit.  While this provider is not your primary care provider (PCP), if your PCP is located in our provider database this encounter information will be shared with them immediately following your visit.  Consent: (Patient) Rachel Clay provided verbal consent for this virtual visit at the beginning of the encounter.  Current Medications:  Current Outpatient Medications:    azelastine (ASTELIN) 0.1 % nasal spray, Place 1 spray into both nostrils 2 (two) times daily. Use in each nostril as directed (Patient not taking: Reported on 06/15/2021), Disp: 30 mL, Rfl: 0   benzonatate (TESSALON) 100 MG capsule, Take 1 capsule (100 mg total) by mouth 3 (three) times daily as needed., Disp: 30 capsule, Rfl: 0   cyclobenzaprine (FLEXERIL) 10 MG tablet, Take 1 tablet (10 mg total) by mouth 3 (three) times daily as needed for muscle spasms., Disp: 30 tablet, Rfl: 0   EPINEPHrine 0.3 mg/0.3 mL IJ SOAJ injection, Inject 0.3 mg into the muscle as needed for anaphylaxis., Disp: 2 each, Rfl: 1   EUCRISA 2 % OINT, APPLY TO AFFECTED AREA TWICE A DAY AS NEEDED, Disp: , Rfl:    fluticasone (FLONASE) 50 MCG/ACT nasal spray, Place 2 sprays into both nostrils daily for 14 days., Disp: 11.1 mL, Rfl: 0   fluticasone (FLOVENT HFA) 110 MCG/ACT inhaler, 2 puffs twice daily with spacer to prevent coughing or wheezing., Disp: 1 each, Rfl: 5   ibuprofen (ADVIL) 600 MG tablet, Take 1 tablet (600 mg total) by mouth every 8 (eight) hours as needed., Disp: 20 tablet, Rfl: 0   ipratropium (ATROVENT) 0.03 % nasal spray, Place 2 sprays into both nostrils every 12 (twelve) hours., Disp: 30 mL, Rfl: 0   levalbuterol (XOPENEX HFA) 45 MCG/ACT inhaler, Inhale 2 puffs into the lungs every 6 (six) hours as needed for wheezing., Disp: 1 each, Rfl: 1   levalbuterol (XOPENEX HFA) 45 MCG/ACT inhaler, Inhale 2 puffs into the lungs every 6 (six) hours as needed for  wheezing., Disp: 1 each, Rfl: 1   levalbuterol (XOPENEX) 1.25 MG/3ML nebulizer solution, Take 1.25 mg by nebulization every 4 (four) hours as needed for wheezing., Disp: 72 mL, Rfl: 5   levonorgestrel (MIRENA) 20 MCG/24HR IUD, by Intrauterine route., Disp: , Rfl:    metFORMIN (GLUCOPHAGE-XR) 500 MG 24 hr tablet, Take 500 mg by mouth 2 (two) times daily., Disp: , Rfl:    metoprolol succinate (TOPROL-XL) 25 MG 24 hr tablet, Take 12.5 mg by mouth daily., Disp: , Rfl:    montelukast (SINGULAIR) 10 MG tablet, Take 1 tablet (10 mg total) by mouth at bedtime., Disp: 30 tablet, Rfl: 5   naproxen (NAPROSYN) 500 MG tablet, Take 1 tablet (500 mg total) by mouth 2 (two) times daily with a meal., Disp: 30 tablet, Rfl: 0   Pseudoephedrine-Guaifenesin (MUCINEX D PO), Take by mouth. (Patient not taking: Reported on 06/15/2021), Disp: , Rfl:    triamcinolone (KENALOG) 0.1 %, APPLY TO AFFECTED AREA TWICE DAILY AS NEEDED, Disp: , Rfl:    Medications ordered in this encounter:  No orders of the defined types were placed in this encounter.    *If you need refills on other medications prior to your next appointment, please contact your pharmacy*  Follow-Up: Call back or seek an in-person evaluation if the symptoms worsen or if the condition fails to improve as anticipated.  Other Instructions Please keep well-hydrated and get plenty of rest. Start a saline nasal rinse  to flush out your nasal passages. You can use plain Mucinex to help thin congestion. If you have a humidifier, running in the bedroom at night. I want you to start OTC vitamin D3 1000 units daily, vitamin C 1000 mg daily, and a zinc supplement. Please take prescribed medications as directed.  You have been enrolled in a MyChart symptom monitoring program. Please answer these questions daily so we can keep track of how you are doing.  You were to quarantine for 5 days from onset of your symptoms.  After day 5, if you have had no fever and you  are feeling better, you can end quarantine but need to mask for an additional 5 days. After day 5 if you have a fever or are having significant symptoms, please quarantine for full 10 days.  If you note any worsening of symptoms, any significant shortness of breath or any chest pain, please seek ER evaluation ASAP.  Please do not delay care!  COVID-19: What to Do if You Are Sick If you test positive and are an older adult or someone who is at high risk of getting very sick from COVID-19, treatment may be available. Contact a healthcare provider right away after a positive test to determine if you are eligible, even if your symptoms are mild right now. You can also visit a Test to Treat location and, if eligible, receive a prescription from a provider. Don't delay: Treatment must be started within the first few days to be effective. If you have a fever, cough, or other symptoms, you might have COVID-19. Most people have mild illness and are able to recover at home. If you are sick: Keep track of your symptoms. If you have an emergency warning sign (including trouble breathing), call 911. Steps to help prevent the spread of COVID-19 if you are sick If you are sick with COVID-19 or think you might have COVID-19, follow the steps below to care for yourself and to help protect other people in your home and community. Stay home except to get medical care Stay home. Most people with COVID-19 have mild illness and can recover at home without medical care. Do not leave your home, except to get medical care. Do not visit public areas and do not go to places where you are unable to wear a mask. Take care of yourself. Get rest and stay hydrated. Take over-the-counter medicines, such as acetaminophen, to help you feel better. Stay in touch with your doctor. Call before you get medical care. Be sure to get care if you have trouble breathing, or have any other emergency warning signs, or if you think it is an  emergency. Avoid public transportation, ride-sharing, or taxis if possible. Get tested If you have symptoms of COVID-19, get tested. While waiting for test results, stay away from others, including staying apart from those living in your household. Get tested as soon as possible after your symptoms start. Treatments may be available for people with COVID-19 who are at risk for becoming very sick. Don't delay: Treatment must be started early to be effective--some treatments must begin within 5 days of your first symptoms. Contact your healthcare provider right away if your test result is positive to determine if you are eligible. Self-tests are one of several options for testing for the virus that causes COVID-19 and may be more convenient than laboratory-based tests and point-of-care tests. Ask your healthcare provider or your local health department if you need help interpreting your test results.  You can visit your state, tribal, local, and territorial health department's website to look for the latest local information on testing sites. Separate yourself from other people As much as possible, stay in a specific room and away from other people and pets in your home. If possible, you should use a separate bathroom. If you need to be around other people or animals in or outside of the home, wear a well-fitting mask. Tell your close contacts that they may have been exposed to COVID-19. An infected person can spread COVID-19 starting 48 hours (or 2 days) before the person has any symptoms or tests positive. By letting your close contacts know they may have been exposed to COVID-19, you are helping to protect everyone. See COVID-19 and Animals if you have questions about pets. If you are diagnosed with COVID-19, someone from the health department may call you. Answer the call to slow the spread. Monitor your symptoms Symptoms of COVID-19 include fever, cough, or other symptoms. Follow care instructions  from your healthcare provider and local health department. Your local health authorities may give instructions on checking your symptoms and reporting information. When to seek emergency medical attention Look for emergency warning signs* for COVID-19. If someone is showing any of these signs, seek emergency medical care immediately: Trouble breathing Persistent pain or pressure in the chest New confusion Inability to wake or stay awake Pale, gray, or blue-colored skin, lips, or nail beds, depending on skin tone *This list is not all possible symptoms. Please call your medical provider for any other symptoms that are severe or concerning to you. Call 911 or call ahead to your local emergency facility: Notify the operator that you are seeking care for someone who has or may have COVID-19. Call ahead before visiting your doctor Call ahead. Many medical visits for routine care are being postponed or done by phone or telemedicine. If you have a medical appointment that cannot be postponed, call your doctor's office, and tell them you have or may have COVID-19. This will help the office protect themselves and other patients. If you are sick, wear a well-fitting mask You should wear a mask if you must be around other people or animals, including pets (even at home). Wear a mask with the best fit, protection, and comfort for you. You don't need to wear the mask if you are alone. If you can't put on a mask (because of trouble breathing, for example), cover your coughs and sneezes in some other way. Try to stay at least 6 feet away from other people. This will help protect the people around you. Masks should not be placed on young children under age 64 years, anyone who has trouble breathing, or anyone who is not able to remove the mask without help. Cover your coughs and sneezes Cover your mouth and nose with a tissue when you cough or sneeze. Throw away used tissues in a lined trash can. Immediately  wash your hands with soap and water for at least 20 seconds. If soap and water are not available, clean your hands with an alcohol-based hand sanitizer that contains at least 60% alcohol. Clean your hands often Wash your hands often with soap and water for at least 20 seconds. This is especially important after blowing your nose, coughing, or sneezing; going to the bathroom; and before eating or preparing food. Use hand sanitizer if soap and water are not available. Use an alcohol-based hand sanitizer with at least 60% alcohol, covering all surfaces of  your hands and rubbing them together until they feel dry. Soap and water are the best option, especially if hands are visibly dirty. Avoid touching your eyes, nose, and mouth with unwashed hands. Handwashing Tips Avoid sharing personal household items Do not share dishes, drinking glasses, cups, eating utensils, towels, or bedding with other people in your home. Wash these items thoroughly after using them with soap and water or put in the dishwasher. Clean surfaces in your home regularly Clean and disinfect high-touch surfaces (for example, doorknobs, tables, handles, light switches, and countertops) in your "sick room" and bathroom. In shared spaces, you should clean and disinfect surfaces and items after each use by the person who is ill. If you are sick and cannot clean, a caregiver or other person should only clean and disinfect the area around you (such as your bedroom and bathroom) on an as needed basis. Your caregiver/other person should wait as long as possible (at least several hours) and wear a mask before entering, cleaning, and disinfecting shared spaces that you use. Clean and disinfect areas that may have blood, stool, or body fluids on them. Use household cleaners and disinfectants. Clean visible dirty surfaces with household cleaners containing soap or detergent. Then, use a household disinfectant. Use a product from [employer] List N:  Disinfectants for Coronavirus (COVID-19). Be sure to follow the instructions on the label to ensure safe and effective use of the product. Many products recommend keeping the surface wet with a disinfectant for a certain period of time (look at "contact time" on the product label). You may also need to wear personal protective equipment, such as gloves, depending on the directions on the product label. Immediately after disinfecting, wash your hands with soap and water for 20 seconds. For completed guidance on cleaning and disinfecting your home, visit Complete Disinfection Guidance. Take steps to improve ventilation at home Improve ventilation (air flow) at home to help prevent from spreading COVID-19 to other people in your household. Clear out COVID-19 virus particles in the air by opening windows, using air filters, and turning on fans in your home. Use this interactive tool to learn how to improve air flow in your home. When you can be around others after being sick with COVID-19 Deciding when you can be around others is different for different situations. Find out when you can safely end home isolation. For any additional questions about your care, contact your healthcare provider or state or local health department. 10/06/2020 Content source: [facility] for Immunization and Respiratory Diseases ([facility]), Division of Viral Diseases This information is not intended to replace advice given to you by your health care provider. Make sure you discuss any questions you have with your health care provider. Document Revised: 11/19/2020 Document Reviewed: 11/19/2020 Elsevier Patient Education  2022 [employer].      If you have been instructed to have an in-person evaluation today at a local Urgent Care facility, please use the link below. It will take you to a list of all of our available [facility], including address, phone number and hours of operation. Please do not delay  care.  [facility]  If you or a family member do not have a primary care provider, use the link below to schedule a visit and establish care. When you choose a [facility] primary care physician or advanced practice provider, you gain a long-term partner in health. Find a Primary Care Provider  Learn more about [facility]'s in-office and  virtual care options: [facility]

## 2021-09-10 ENCOUNTER — Telehealth: Payer: Self-pay | Admitting: *Deleted

## 2021-09-10 NOTE — Telephone Encounter (Signed)
Pt answered COVID questionnaire and set off BPA for diarrhea. Pt states it is not bad and she is able to keep down gatorade. She is being careful not to become dehydrated, Safety info given, pt verbalized understanding. Encouraged to continue to answer questionnaire daily.

## 2021-10-10 ENCOUNTER — Telehealth: Payer: Medicaid Other | Admitting: Nurse Practitioner

## 2021-10-10 DIAGNOSIS — K0889 Other specified disorders of teeth and supporting structures: Secondary | ICD-10-CM

## 2021-10-10 MED ORDER — IBUPROFEN 800 MG PO TABS: 800.0000 mg | ORAL_TABLET | Freq: Three times a day (TID) | ORAL | 0 refills | Status: DC | PRN

## 2021-10-10 NOTE — Progress Notes (Signed)
I have spent 5 minutes in review of e-visit questionnaire, review and updating patient chart, medical decision making and response to patient.  ° °Jo-Anne Kluth W Quantel Mcinturff, NP ° °  °

## 2021-10-10 NOTE — Progress Notes (Signed)
E-Visit for Dental Pain ? ?We are sorry that you are not feeling well.  Here is how we plan to help! ? ?Based on what you have shared with me in the questionnaire, it sounds like you have pain from your wisdom tooth erupting from your gumline ? ?I have prescribed Ibuprofen 800mg  3 times a day for 7 days for discomfort ? ?It is imperative that you see a dentist within 10 days of this eVisit to determine the cause of the dental pain and be sure it is adequately treated ? ?A toothache or tooth pain is caused when the nerve in the root of a tooth or surrounding a tooth is irritated. Dental (tooth) infection, decay, injury, or loss of a tooth are the most common causes of dental pain. Pain may also occur after an extraction (tooth is pulled out). Pain sometimes originates from other areas and radiates to the jaw, thus appearing to be tooth pain.Bacteria growing inside your mouth can contribute to gum disease and dental decay, both of which can cause pain. A toothache occurs from inflammation of the central portion of the tooth called pulp. The pulp contains nerve endings that are very sensitive to pain. Inflammation to the pulp or pulpitis may be caused by dental cavities, trauma, and infection.  ? ? ?HOME CARE:  ? ?For toothaches: ?Over-the-counter pain medications such as acetaminophen or ibuprofen may be used. Take these as directed on the package while you arrange for a dental appointment. ?Avoid very cold or hot foods, because they may make the pain worse. ?You may get relief from biting on a cotton ball soaked in oil of cloves. You can get oil of cloves at most drug stores. ? ?For jaw pain: ? Aspirin may be helpful for problems in the joint of the jaw in adults. ?If pain happens every time you open your mouth widely, the temporomandibular joint (TMJ) may be the source of the pain. Yawning or taking a large bite of food may worsen the pain. An appointment with your doctor or dentist will help you find the cause. ?   ? ? ?GET HELP RIGHT AWAY IF: ? ?You have a high fever or chills ?If you have had a recent head or face injury and develop headache, light headedness, nausea, vomiting, or other symptoms that concern you after an injury to your face or mouth, you could have a more serious injury in addition to your dental injury. ?A facial rash associated with a toothache: This condition may improve with medication. Contact your doctor for them to decide what is appropriate. ?Any jaw pain occurring with chest pain: Although jaw pain is most commonly caused by dental disease, it is sometimes referred pain from other areas. People with heart disease, especially people who have had stents placed, people with diabetes, or those who have had heart surgery may have jaw pain as a symptom of heart attack or angina. If your jaw or tooth pain is associated with lightheadedness, sweating, or shortness of breath, you should see a doctor as soon as possible. ?Trouble swallowing or excessive pain or bleeding from gums: If you have a history of a weakened immune system, diabetes, or steroid use, you may be more susceptible to infections. Infections can often be more severe and extensive or caused by unusual organisms. Dental and gum infections in people with these conditions may require more aggressive treatment. An abscess may need draining or IV antibiotics, for example. ? ?MAKE SURE YOU  ? ?Understand these instructions. ?  Will watch your condition. ?Will get help right away if you are not doing well or get worse. ? ?Thank you for choosing an e-visit. ? ?Your e-visit answers were reviewed by a board certified advanced clinical practitioner to complete your personal care plan. Depending upon the condition, your plan could have included both over the counter or prescription medications. ? ?Please review your pharmacy choice. Make sure the pharmacy is open so you can pick up prescription now. If there is a problem, you may contact your provider  through [employer] and have the prescription routed to another pharmacy.  Your safety is important to [place]. If you have drug allergies check your prescription carefully.  ? ?For the next 24 hours you can use MyChart to ask questions about today's visit, request a non-urgent call back, or ask for a work or school excuse. ?You will get an email in the next two days asking about your experience. I hope that your e-visit has been valuable and will speed your recovery.  ?

## 2021-10-15 ENCOUNTER — Telehealth: Payer: Medicaid Other | Admitting: Nurse Practitioner

## 2021-10-15 DIAGNOSIS — B9789 Other viral agents as the cause of diseases classified elsewhere: Secondary | ICD-10-CM

## 2021-10-15 MED ORDER — FLUTICASONE PROPIONATE 50 MCG/ACT NA SUSP: 2.0000 | Freq: Every day | NASAL | 6 refills | Status: DC

## 2021-10-15 NOTE — Progress Notes (Signed)
E-Visit for Sinus Problems ? ?We are sorry that you are not feeling well.  Here is how we plan to help! ? ?Based on what you have shared with me it looks like you have sinusitis.  Sinusitis is inflammation and infection in the sinus cavities of the head.  Based on your presentation I believe you most likely have Acute Viral Sinusitis.This is an infection most likely caused by a virus. There is not specific treatment for viral sinusitis other than to help you with the symptoms until the infection runs its course.  You may use an oral decongestant such as Mucinex D or if you have glaucoma or high blood pressure use plain Mucinex. Saline nasal spray help and can safely be used as often as needed for congestion, I have prescribed: Fluticasone nasal spray two sprays in each nostril once a day ? ?Some authorities believe that zinc sprays or the use of Echinacea may shorten the course of your symptoms. ? ?Sinus infections are not as easily transmitted as other respiratory infection, however we still recommend that you avoid close contact with loved ones, especially the very young and elderly.  Remember to wash your hands thoroughly throughout the day as this is the number one way to prevent the spread of infection! ? ?Home Care: ?Only take medications as instructed by your medical team. ?Do not take these medications with alcohol. ?A steam or ultrasonic humidifier can help congestion.  You can place a towel over your head and breathe in the steam from hot water coming from a faucet. ?Avoid close contacts especially the very young and the elderly. ?Cover your mouth when you cough or sneeze. ?Always remember to wash your hands. ? ?Get Help Right Away If: ?You develop worsening fever or sinus pain. ?You develop a severe head ache or visual changes. ?Your symptoms persist after you have completed your treatment plan. ? ?Make sure you ?Understand these instructions. ?Will watch your condition. ?Will get help right away if you  are not doing well or get worse. ? ? ?Thank you for choosing an e-visit. ? ?Your e-visit answers were reviewed by a board certified advanced clinical practitioner to complete your personal care plan. Depending upon the condition, your plan could have included both over the counter or prescription medications. ? ?Please review your pharmacy choice. Make sure the pharmacy is open so you can pick up prescription now. If there is a problem, you may contact your provider through MyChart messaging and have the prescription routed to another pharmacy.  Your safety is important to us. If you have drug allergies check your prescription carefully.  ? ?For the next 24 hours you can use MyChart to ask questions about today's visit, request a non-urgent call back, or ask for a work or school excuse. ?You will get an email in the next two days asking about your experience. I hope that your e-visit has been valuable and will speed your recovery. ? ?5-10 minutes spent reviewing and documenting in chart. ? ?

## 2021-11-24 ENCOUNTER — Telehealth: Payer: Medicaid Other | Admitting: Physician Assistant

## 2021-11-24 DIAGNOSIS — N898 Other specified noninflammatory disorders of vagina: Secondary | ICD-10-CM

## 2021-11-24 DIAGNOSIS — M545 Low back pain, unspecified: Secondary | ICD-10-CM

## 2021-11-24 DIAGNOSIS — R109 Unspecified abdominal pain: Secondary | ICD-10-CM

## 2021-11-24 NOTE — Progress Notes (Signed)
Based on what you shared with me, I feel your condition warrants further evaluation and I recommend that you be seen in a face to face visit. ? ?Giving back and belly pain with the vaginal discharge, you need to be seen in person to get an examination and vaginal/urine testing to make sure you get the proper diagnosis/treatment and to make sure nothing is missed.  ?  ?NOTE: There will be NO CHARGE for this eVisit ?  ?If you are having a true medical emergency please call 911.   ?  ? For an urgent face to face visit, [facility] has six urgent care centers for your convenience:  ?  ? [facility] ?Get Driving Directions ?[phone] ?[phone] ?[place], [place] [address] ?  ? [facility]) ?Get Driving Directions ?[phone] ?[address] ?[place], [place] [address] ? ?[facility]) ?Get Driving Directions ?[phone] ?[address] ?[place],  [place]  [address] ? ?[facility] ?Get Driving Directions ?[phone] ?[address] ?[place], [place] [address] ?  ?[facility] [place] ?Get Driving Directions  ?[phone] ?[address] ?[address] ?[place], [place] [address] ?  ? Urgent Care at [facility] ?Get Driving Directions ?[phone] ?[address] ?[place], [place] [address] ? ?Your MyChart E-visit questionnaire answers were reviewed by a board certified advanced clinical practitioner to complete your personal care plan based on your specific symptoms.  Thank you for using e-Visits. ?  ? ?

## 2021-12-14 ENCOUNTER — Ambulatory Visit: Payer: Medicaid Other | Admitting: Internal Medicine

## 2021-12-14 ENCOUNTER — Other Ambulatory Visit: Payer: Self-pay | Admitting: Nurse Practitioner

## 2021-12-14 DIAGNOSIS — K0889 Other specified disorders of teeth and supporting structures: Secondary | ICD-10-CM

## 2021-12-14 NOTE — Patient Instructions (Incomplete)
*** Asthma: *** - Breathing test today showed: *** - Based on symptoms and breathing tests your asthma is *** and we need to ***  PLAN:  - {Blank single:19197::"Neb and teaching provided","Spacer sample and demonstration provided","Spacer use reviewed","Spacer not needed with current regimen","***"}. - Daily controller medication(s): {Blank multiple:19196:a:"***","Pulmicort ***mg nebulizer *** treatment(s) *** time(s) daily","Singulair ***mg daily","Flovent *** puffs once daily with spacer","Flovent *** puffs twice daily with spacer","Flovent *** puffs once daily with spacer","Flovent *** puffs twice daily with spacer","Pulmicort Flexhaler *** puffs twice daily","Pulmicort Flexhaler *** puffs once daily","Pulmicort Flexhaler *** puffs twice daily","Pulmicort Flexhaler *** puffs once daily","Symbicort 80/4.28mcg two puffs once daily","Symbicort 80/4.77mcg two puffs twice daily with spacer","Symbicort 160/4.61mcg two puffs once daily","Symbicort 160/4.39mcg two puffs twice daily with spacer","Dulera 100/75mcg two puffs twice daily with spacer","Dulera 200/22mcg two puffs twice daily with spacer","Advair 45/63mcg two puffs twice daily with spacer","Advair 115/70mcg two puffs twice daily with spacer","Advair 230/63mcg two puffs twice daily with spacer","Breo 100/98mcg one puff once daily","Breo 200/26mcg one puff once daily","Spiriva 1.74mcg two puffs once daily","Trelegy 100/62.5/25 one puff once daily"} - Prior to physical activity: {Blank multiple:19196:o:"albuterol 2 puffs","ProAir 2 puffs","Proventil 2 puffs","Ventolin 2 puffs","Xopenex 2 puffs"} 10-15 minutes before physical activity. - Rescue medications: {Blank multiple:19196:o:"albuterol 4 puffs every 4-6 hours as needed","ProAir 4 puffs every 4-6 hours as needed","Proventil 4 puffs every 4-6 hours as needed","Ventolin 4 puffs every 4-6 hours as needed","Xopenex 4 puffs every 4-6 hours as needed","albuterol  nebulizer one vial every 4-6 hours as needed","DuoNeb nebulizer one vial every 4-6 hours as needed"} - Changes during respiratory infections or worsening symptoms: {Blank single:19197::"Add on","Increase"} *** to {Blank single:19197::"1 puff","2 puffs","3 puffs","4 puffs","*** puffs"} {Blank single:19197::"once daily","twice daily","three times daily","*** times daily"} for TWO WEEKS. - Get Influenza Vaccine and appropriate Pneumonia and COVID 19 boosters  - Asthma control goals:  * Full participation in all desired activities (may need albuterol before activity) * Albuterol use two time or less a week on average (not counting use with activity) * Cough interfering with sleep two time or less a month * Oral steroids no more than once a year * No hospitalizations  Allergic  Rhinitis: *** controlled  - Previous testing *** - Continue Avoidance measures - Stop taking: *** - Continue with: {Blank multiple:19196:a:"Allegra (fexofenadine) 180mg  table once daily","Allegra (fexofenadine) 39mL twice daily","Zyrtec (cetirizine) 10mg  tablet once daily","Zyrtec (cetirizine) ***mL once daily","Xyzal (levocetirizine) 5mg  tablet once daily","Xyzal (levocetirizine) ***mL once daily","Claritin (loratadine) 10mg  tablet once daily","Claritin (loratadine) ***mL once daily","Ryvent (carbinoxamine) 6mg  tablet 3-4 times daily as needed","RyClora *** mL every 6 hours as needed","Karbinal ER *** mL every 12 hours as needed","Singulair (montelukast) ***mg daily","Flonase (fluticasone) one spray per nostril daily","Flonase (fluticasone) two sprays per nostril daily","Flonase (fluticasone) one spray per nostril on Mon/Wed/Fri","Xhance (fluticasone) 1-2 sprays per nostril daily","Xhance (fluticasone) 1-2 sprays per nostril twice daily","Nasacort (triamcinolone) one spray per nostril daily","Nasacort (triamcinolone) two sprays per nostril daily","Nasonex (momentason) one spray per nostril daily","Nasonex (mometasone) two sprays per  nostril daily","Astelin (azelastine) 2 sprays per nostril 1-2 times daily as needed","Patanase (olopatadine) two sprays per nostril 1-2 times daily as needed","Dymista (fluticasone/azelastine) two sprays per nostril 1-2 times daily as needed","Pataday (olopatadine) one drop per eye twice daily as needed","Pazeo (olopatadine) one drop per eye daily as needed","Bepreve (bepotastine) one drop per eye twice daily as needed)"} - Start taking: {Blank multiple:19196:a:"Allegra (fexofenadine) 180mg  table once daily","Allegra (fexofenadine) 63mL twice daily","Zyrtec (cetirizine) 10mg  tablet once daily","Zyrtec (cetirizine) ***mL once daily","Xyzal (levocetirizine) 5mg  tablet once daily","Xyzal (levocetirizine) ***mL once daily","Claritin (  loratadine) 10mg  tablet once daily","Claritin (loratadine) ***mL once daily","Ryvent (carbinoxamine) 6mg  tablet 3-4 times daily as needed","RyClora *** mL every 6 hours as needed","Karbinal ER *** mL every 12 hours as needed","Singulair (montelukast) ***mg daily","Flonase (fluticasone) one spray per nostril daily","Flonase (fluticasone) two sprays per nostril daily","Flonase (fluticasone) one spray per nostril on Mon/Wed/Fri","Xhance (fluticasone) 1-2 sprays per nostril daily","Xhance (fluticasone) 1-2 sprays per nostril twice daily","Nasacort (triamcinolone) one spray per nostril daily","Nasacort (triamcinolone) two sprays per nostril daily","Nasonex (momentason) one spray per nostril daily","Nasonex (mometasone) two sprays per nostril daily","Astelin (azelastine) 2 sprays per nostril 1-2 times daily as needed","Patanase (olopatadine) two sprays per nostril 1-2 times daily as needed","Dymista (fluticasone/azelastine) two sprays per nostril 1-2 times daily as needed","Pataday (olopatadine) one drop per eye twice daily as needed","Pazeo (olopatadine) one drop per eye daily as needed","Bepreve (bepotastine) one drop per eye twice daily as needed)"} - You can use an extra dose of the  antihistamine, if needed, for breakthrough symptoms.  - Consider nasal saline rinses 1-2 times daily to remove allergens from the nasal cavities as well as help with mucous clearance (this is especially helpful to do before the nasal sprays are given) - Consider allergy shots as a means of long-term control and can reduce lifetime use of medications    Atopic Dermatitis:  -Continue basic skin care, avoidance of fragrances dyes, daily use of emollients   Food Allergy:  -Testing: -Continue avoidance of -Continue to carry EpiPen and follow food allergy action plan  Follow up:   Thank you so much for letting me partake in your care today.  Don't hesitate to reach out if you have any additional concerns!  , MD  [facility] [place], [place]

## 2021-12-14 NOTE — Progress Notes (Deleted)
Follow Up Note  RE: Rachel Clay MRN: [id number] DOB: 11-29-1990 Date of Office Visit: 12/14/2021  Referring provider: Verlee Rossetti, PA-C Primary care provider: Verlee Rossetti, PA-C  Chief Complaint: No chief complaint on file.  History of Present Illness: I had the pleasure of seeing Rachel Clay for a follow up visit at the [facility] on 12/14/2021. She is a 31 y.o. female, who is being followed for ***. Her previous allergy office visit was on *** with {Blank single:19197::"Dr. Almetta Liddicoat","Dr. Kim","Dr. Kozlow","Dr. Bardelas","Dr. Bobbitt","Dr. Gallagher","Dr. Eugenio Hoes, FNP","Christine Amada Jupiter FNP"}. Today is a {Blank single:19197::"regular follow up visit","new complaint visit of ***","skin testing and follow up visit"}.  ASTHMA - Medical therapy: none currently, AAP to addon Flovent 2 puffs twice daily as needed for increased symptoms  - Rescue inhaler use: albuterol - using  - Symptoms: *** - Exacerbation history: *** ABX for respiratory illness since last visit, *** OCS, ***ED, *** UC visits in the past year  - ACT: *** /25 - Adverse effects of medication: denies  - Previous FEV1: 2.5 L, 80%  Atopic dermatitis: flares mostly *** -current regimen: dove soap and vaseline for emollient,  nothing for flares -reports good avoidance of fragrance/dye free products - sleep un affected - itch controlled  Allergic Rhinitis: current therapy: Claritin 10mg , Singulair 10mg  daily ,  symptoms {Blank single:19197::"improved","partially improved","not improved"} symptoms include: {Blank multiple:19196:a:"***","nasal congestion","rhinorrhea","post nasal drainage","sneezing","watery eyes","itchy eyes","itchy nose"} Previous allergy testing:  In middle school, needs update  History of reflux/heartburn: {Blank single:19197::"yes","no"} Interested in Allergy Immunotherapy: {Blank single:19197::"yes","no"}  Food Allergy: continues to avoid All cows milk  products  -*** accidental exposures - *** use of epinephrine -Previous testing: Needs updating   Assessment and Plan: Rachel Clay is a 31 y.o. female with: No diagnosis found. Plan: There are no Patient Instructions on file for this visit. No follow-ups on file.  No orders of the defined types were placed in this encounter.   Lab Orders  No laboratory test(s) ordered today   Diagnostics: Spirometry:  Tracings reviewed. Her effort: {Blank single:19197::"Good reproducible efforts.","It was hard to get consistent efforts and there is a question as to whether this reflects a maximal maneuver.","Poor effort, data can not be interpreted."} FVC: ***L FEV1: ***L, ***% predicted FEV1/FVC ratio: ***% Interpretation: {Blank single:19197::"Spirometry consistent with mild obstructive disease","Spirometry consistent with moderate obstructive disease","Spirometry consistent with severe obstructive disease","Spirometry consistent with possible restrictive disease","Spirometry consistent with mixed obstructive and restrictive disease","Spirometry uninterpretable due to technique","Spirometry consistent with normal pattern","No overt abnormalities noted given today's efforts"}.  Please see scanned spirometry results for details.  Skin Testing: {Blank single:19197::"Select foods","Environmental allergy panel","Environmental allergy panel and select foods","Food allergy panel","None","Deferred due to recent antihistamines use"}. *** Results interpreted by myself during this encounter and discussed with patient/family.   Medication List:  Current Outpatient Medications  Medication Sig Dispense Refill   azelastine (ASTELIN) 0.1 % nasal spray Place 1 spray into both nostrils 2 (two) times daily. Use in each nostril as directed (Patient not taking: Reported on 06/15/2021) 30 mL 0   benzonatate (TESSALON) 100 MG capsule Take 1 capsule (100 mg total) by mouth 3 (three) times daily as needed. 30 capsule 0    cyclobenzaprine (FLEXERIL) 10 MG tablet Take 1 tablet (10 mg total) by mouth 3 (three) times daily as needed for muscle spasms. 30 tablet 0   EPINEPHrine 0.3 mg/0.3 mL IJ SOAJ injection Inject 0.3 mg into the muscle as needed for anaphylaxis. 2 each 1   EUCRISA  2 % OINT APPLY TO AFFECTED AREA TWICE A DAY AS NEEDED     fluticasone (FLONASE) 50 MCG/ACT nasal spray Place 2 sprays into both nostrils daily. 16 g 6   fluticasone (FLOVENT HFA) 110 MCG/ACT inhaler 2 puffs twice daily with spacer to prevent coughing or wheezing. 1 each 5   ibuprofen (ADVIL) 800 MG tablet Take 1 tablet (800 mg total) by mouth every 8 (eight) hours as needed. 60 tablet 0   ipratropium (ATROVENT) 0.03 % nasal spray Place 2 sprays into both nostrils every 12 (twelve) hours. 30 mL 0   levalbuterol (XOPENEX HFA) 45 MCG/ACT inhaler Inhale 2 puffs into the lungs every 6 (six) hours as needed for wheezing. 1 each 1   levalbuterol (XOPENEX HFA) 45 MCG/ACT inhaler Inhale 2 puffs into the lungs every 6 (six) hours as needed for wheezing. 1 each 1   levalbuterol (XOPENEX) 1.25 MG/3ML nebulizer solution Take 1.25 mg by nebulization every 4 (four) hours as needed for wheezing. 72 mL 5   levonorgestrel (MIRENA) 20 MCG/24HR IUD by Intrauterine route.     metFORMIN (GLUCOPHAGE-XR) 500 MG 24 hr tablet Take 500 mg by mouth 2 (two) times daily.     metoprolol succinate (TOPROL-XL) 25 MG 24 hr tablet Take 12.5 mg by mouth daily.     montelukast (SINGULAIR) 10 MG tablet Take 1 tablet (10 mg total) by mouth at bedtime. 30 tablet 5   naproxen (NAPROSYN) 500 MG tablet Take 1 tablet (500 mg total) by mouth 2 (two) times daily with a meal. 30 tablet 0   Pseudoephedrine-Guaifenesin (MUCINEX D PO) Take by mouth. (Patient not taking: Reported on 06/15/2021)     triamcinolone (KENALOG) 0.1 % APPLY TO AFFECTED AREA TWICE DAILY AS NEEDED     No current facility-administered medications for this visit.   Allergies: Allergies  Allergen Reactions    Milk-Related Compounds Shortness Of Breath    dairy   Other Shortness Of Breath    dairy   I reviewed her past medical history, social history, family history, and environmental history and no significant changes have been reported from her previous visit.  ROS: All others negative except as noted per HPI.   Objective: There were no vitals taken for this visit. There is no height or weight on file to calculate BMI. General Appearance:  Alert, cooperative, no distress, appears stated age  Head:  Normocephalic, without obvious abnormality, atraumatic  Eyes:  Conjunctiva clear, EOM's intact  Nose: Nares normal, {Blank multiple:19196:a:"***","hypertrophic turbinates","normal mucosa","no visible anterior polyps","septum midline"}  Throat: Lips, tongue normal; teeth and gums normal, {Blank multiple:19196:a:"***","normal posterior oropharynx","tonsils 2+","tonsils 3+","no tonsillar exudate","+ cobblestoning"}  Neck: Supple, symmetrical  Lungs:   {Blank multiple:19196:a:"***","clear to auscultation bilaterally","end-expiratory wheezing","wheezing throughout"}, Respirations unlabored, {Blank multiple:19196:a:"***","no coughing","intermittent dry coughing"}  Heart:  {Blank multiple:19196:a:"***","regular rate and rhythm","no murmur"}, Appears well perfused  Extremities: No edema  Skin: Skin color, texture, turgor normal, no rashes or lesions on visualized portions of skin  Neurologic: No gross deficits   Previous notes and tests were reviewed. The plan was reviewed with the patient/family, and all questions/concerned were addressed.  It was my pleasure to see Rachel Clay today and participate in her care. Please feel free to contact me with any questions or concerns.  Sincerely,  Ferol Luz, MD  Allergy & Immunology  [facility] Office: [phone]

## 2021-12-16 NOTE — Patient Instructions (Incomplete)
Mild Intermittent Asthma:  - Daily controller medication(s): None currently  - Prior to physical activity: albuterol 2 puffs 10-15 minutes before physical activity. - Rescue medications: albuterol 4 puffs every 4-6 hours as needed - Changes during respiratory infections or worsening symptoms: Add on Flovent to 2 puffs twice daily for TWO WEEKS. - Get Influenza Vaccine and appropriate Pneumonia and COVID 19 boosters  - Asthma control goals:  * Full participation in all desired activities (may need albuterol before activity) * Albuterol use two time or less a week on average (not counting use with activity) * Cough interfering with sleep two time or less a month * Oral steroids no more than once a year * No hospitalizations  Allergic Rhinitis  - Continue with: Claritin 10mg  daily  - Continue Singulair 10 mg daily  - You can use an extra dose of the antihistamine, if needed, for breakthrough symptoms.  - Consider nasal saline rinses 1-2 times daily to remove allergens from the nasal cavities as well as help with mucous clearance (this is especially helpful to do before the nasal sprays are given)  Atopic Dermatitis  -Continue basic skin care  -Continue emollient therapy with vaseline   Allergy with anaphylaxis due to food, subsequent encounter - Continue avoidance of all milk products to include baked milk  - Continue to carry epinephrine on you at all times  - Follow food allergy action plans for any reactions   Follow up in  months

## 2021-12-17 ENCOUNTER — Ambulatory Visit: Payer: Medicaid Other | Admitting: Family

## 2021-12-17 ENCOUNTER — Telehealth: Payer: Medicaid Other | Admitting: Family Medicine

## 2021-12-17 DIAGNOSIS — J069 Acute upper respiratory infection, unspecified: Secondary | ICD-10-CM

## 2021-12-17 MED ORDER — BENZONATATE 100 MG PO CAPS: 100.0000 mg | ORAL_CAPSULE | Freq: Three times a day (TID) | ORAL | 0 refills | Status: DC

## 2021-12-17 NOTE — Progress Notes (Signed)
We are sorry that you are not feeling well.  Here is how we plan to help!  Based on your presentation I believe you most likely have A cough due to a virus.  This is called viral bronchitis and is best treated by rest, plenty of fluids and control of the cough.  You may use Ibuprofen or Tylenol as directed to help your symptoms.     In addition you may use A non-prescription cough medication called Mucinex DM: take 2 tablets every 12 hours. and A prescription cough medication called Tessalon Perles 100mg. You may take 1-2 capsules every 8 hours as needed for your cough.    From your responses in the eVisit questionnaire you describe inflammation in the upper respiratory tract which is causing a significant cough.  This is commonly called Bronchitis and has four common causes:   Allergies Viral Infections Acid Reflux Bacterial Infection Allergies, viruses and acid reflux are treated by controlling symptoms or eliminating the cause. An example might be a cough caused by taking certain blood pressure medications. You stop the cough by changing the medication. Another example might be a cough caused by acid reflux. Controlling the reflux helps control the cough.  USE OF BRONCHODILATOR ("RESCUE") INHALERS: There is a risk from using your bronchodilator too frequently.  The risk is that over-reliance on a medication which only relaxes the muscles surrounding the breathing tubes can reduce the effectiveness of medications prescribed to reduce swelling and congestion of the tubes themselves.  Although you feel brief relief from the bronchodilator inhaler, your asthma may actually be worsening with the tubes becoming more swollen and filled with mucus.  This can delay other crucial treatments, such as oral steroid medications. If you need to use a bronchodilator inhaler daily, several times per day, you should discuss this with your provider.  There are probably better treatments that could be used to keep  your asthma under control.     HOME CARE Only take medications as instructed by your medical team. Complete the entire course of an antibiotic. Drink plenty of fluids and get plenty of rest. Avoid close contacts especially the very young and the elderly Cover your mouth if you cough or cough into your sleeve. Always remember to wash your hands A steam or ultrasonic humidifier can help congestion.   GET HELP RIGHT AWAY IF: You develop worsening fever. You become short of breath You cough up blood. Your symptoms persist after you have completed your treatment plan MAKE SURE YOU  Understand these instructions. Will watch your condition. Will get help right away if you are not doing well or get worse.    Thank you for choosing an e-visit.  Your e-visit answers were reviewed by a board certified advanced clinical practitioner to complete your personal care plan. Depending upon the condition, your plan could have included both over the counter or prescription medications.  Please review your pharmacy choice. Make sure the pharmacy is open so you can pick up prescription now. If there is a problem, you may contact your provider through MyChart messaging and have the prescription routed to another pharmacy.  Your safety is important to us. If you have drug allergies check your prescription carefully.   For the next 24 hours you can use MyChart to ask questions about today's visit, request a non-urgent call back, or ask for a work or school excuse. You will get an email in the next two days asking about your experience. I hope that   your e-visit has been valuable and will speed your recovery.  I provided 5 minutes of non face-to-face time during this encounter for chart review, medication and order placement, as well as and documentation.   

## 2021-12-19 ENCOUNTER — Telehealth: Payer: Medicaid Other | Admitting: Nurse Practitioner

## 2021-12-19 DIAGNOSIS — M5441 Lumbago with sciatica, right side: Secondary | ICD-10-CM

## 2021-12-19 MED ORDER — IBUPROFEN 800 MG PO TABS: 800.0000 mg | ORAL_TABLET | Freq: Three times a day (TID) | ORAL | 0 refills | Status: DC | PRN

## 2021-12-19 MED ORDER — BACLOFEN 10 MG PO TABS: 10.0000 mg | ORAL_TABLET | Freq: Three times a day (TID) | ORAL | 0 refills | Status: DC

## 2021-12-19 NOTE — Progress Notes (Signed)
I have spent 5 minutes in review of e-visit questionnaire, review and updating patient chart, medical decision making and response to patient.  ° °Ronne Savoia W Hawthorne Day, NP ° °  °

## 2021-12-19 NOTE — Progress Notes (Signed)

## 2021-12-20 NOTE — Patient Instructions (Incomplete)
Mild Intermittent Asthma:  - Daily controller medication(s): None currently  - Prior to physical activity: albuterol 2 puffs 10-15 minutes before physical activity. - Rescue medications: albuterol 4 puffs every 4-6 hours as needed - For now and during respiratory infections or worsening symptoms: Add onFlovent 110mcg to 2 puffs twice daily with spacer for TWO WEEKS. Refill prescription sent. Reviewed proper technique. Spacer given - Get Influenza Vaccine and appropriate Pneumonia and COVID 19 boosters  - Asthma control goals:  * Full participation in all desired activities (may need albuterol before activity) * Albuterol use two time or less a week on average (not counting use with activity) * Cough interfering with sleep two time or less a month * Oral steroids no more than once a year * No hospitalizations  Allergic Rhinitis  - Continue with: Claritin 10mg daily  - Continue Singulair 10 mg daily  - You can use an extra dose of the antihistamine, if needed, for breakthrough symptoms.  - Consider nasal saline rinses 1-2 times daily to remove allergens from the nasal cavities as well as help with mucous clearance (this is especially helpful to do before the nasal sprays are given) -Continue Flonase (fluticasone) 1 to 2 sprays each nostril once a day as needed for stuffy nose. In the right nostril, point the applicator out toward the right ear. In the left nostril, point the applicator out toward the left ear  Atopic Dermatitis  -Continue basic skin care  -Continue emollient therapy with vaseline  -Continue triamcinolone as needed to red itchy areas. Do not use triamcinolone on face, neck, groin, or armpit region  Allergy with anaphylaxis due to food, subsequent encounter - Continue avoidance of all milk products to include baked milk  - Continue to carry epinephrine on you at all times  - Follow food allergy action plans for any reactions   Follow up in 4 weeks with skin testing to  environmental allergens. You will need to be off all antihistamines 3 days prior to this appointment     

## 2021-12-21 ENCOUNTER — Ambulatory Visit (INDEPENDENT_AMBULATORY_CARE_PROVIDER_SITE_OTHER): Payer: Medicaid Other | Admitting: Family

## 2021-12-21 ENCOUNTER — Encounter: Payer: Self-pay | Admitting: Family

## 2021-12-21 VITALS — BP 104/72 | HR 70 | Temp 97.2°F | Resp 20 | Ht 67.32 in | Wt 238.2 lb

## 2021-12-21 DIAGNOSIS — J4540 Moderate persistent asthma, uncomplicated: Secondary | ICD-10-CM

## 2021-12-21 DIAGNOSIS — T7800XD Anaphylactic reaction due to unspecified food, subsequent encounter: Secondary | ICD-10-CM

## 2021-12-21 DIAGNOSIS — L2089 Other atopic dermatitis: Secondary | ICD-10-CM

## 2021-12-21 DIAGNOSIS — J069 Acute upper respiratory infection, unspecified: Secondary | ICD-10-CM | POA: Diagnosis not present

## 2021-12-21 MED ORDER — FLUTICASONE PROPIONATE HFA 110 MCG/ACT IN AERO: INHALATION_SPRAY | RESPIRATORY_TRACT | 5 refills | Status: DC

## 2021-12-21 MED ORDER — EPINEPHRINE 0.3 MG/0.3ML IJ SOAJ: 0.3000 mg | INTRAMUSCULAR | 1 refills | Status: DC | PRN

## 2021-12-21 NOTE — Progress Notes (Signed)
[address] [place] [address] Dept: [phone]  FOLLOW UP NOTE  Patient ID: Rachel Clay, female    DOB: Dec 18, 1990  Age: 31 y.o. MRN: [id number] Date of Office Visit: 12/21/2021  Assessment  Chief Complaint: Asthma (Coughing since last Thursday. Chest tightness right upper chest.)  HPI Rachel Clay is a 31 year old female who presents today for skin testing to environmental allergens (cat), but reports that she does not want to do skin testing due to her symptoms.  She was last seen on June 15, 2021 by Dr. Maurine Minister for mild intermittent asthma, allergic rhinitis, atopic dermatitis, and allergy with anaphylaxis due to food.  She denies any new diagnosis or surgery since her last office visit.  Mild intermittent asthma is reported as not well controlled with albuterol as needed.  She does not have any Flovent 110 mcg to use for asthma flare/upper respiratory tract infections.  She reports on Thursday she had a fever, but has not had one since.  She went to the emergency room on 12/16/21 and was diagnosed with a viral upper respiratory infection.  Her COVID-19, RSV, influenza test were negative.  She was given Lawyer and Mucinex DM.  She does feel like the Mucinex DM has helped.  She reports a cough that is productive.  What she was coughing up started out as dark yellow, but is now white.  She only coughs when she feels like something has to cough up.  She also has rattling in her chest here and there.  She was wheezing at first, but not now.  She has tightness in her chest every now and then ,and a little bit of shortness of breath.  She denies nocturnal awakenings due to breathing problems since her last office visit she has not required any systemic steroids.  Prior to being sick she was not having to use her albuterol at all, but she did have to use her albuterol 2 days ago and it did help with her symptoms.  ACT score today is a 22  Allergic rhinitis is reported as controlled  right now with Mucinex DM, Flonase nasal spray as needed, Claritin 10 mg once a day, and Singulair 10 mg once a day.  She denies rhinorrhea, nasal congestion, and postnasal drip.  She has not had any sinus infections since we last saw her.  She reports years ago she was tested for allergies and was allergic to cat.  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  Atopic dermatitis is reported as good, but she will have flares with weather changes.  She uses Vaseline for moisturization and has not had any skin infections since we last saw her.  She uses triamcinolone as needed.  She continues to avoid milk products including baked milk products without any accidental ingestion or use of her epinephrine autoinjector device.  She reports that her epinephrine autoinjector device is up-to-date.   Drug Allergies:  Allergies  Allergen Reactions   Milk-Related Compounds Shortness Of Breath    dairy   Other Shortness Of Breath    dairy    Review of Systems: Review of Systems  Constitutional:  Positive for fever. Negative for chills.       Reports fever last Thursday and her symptoms started, but she has not had a fever since.  HENT:         Denies rhinorrhea, nasal congestion, and  postnasal drip  Eyes:        Denies itchy watery eyes  Respiratory:  Positive for cough and shortness of breath. Negative for wheezing.        Reports a productive cough when she feels like something has to come up.  At the beginning her productive cough had yellow sputum, now it is white.  She also reports a rattling in her chest, wheezing at first, but not now, tightness in her chest every now and then, and a little bit of shortness of breath.  Denies nocturnal awakenings due to breathing problems.  Cardiovascular:  Negative for chest pain and palpitations.  Gastrointestinal:        Denies heartburn or reflux symptoms  Genitourinary:  Negative for  frequency.  Skin:  Negative for itching and rash.  Neurological:  Negative for headaches.  Endo/Heme/Allergies:  Positive for environmental allergies.    Physical Exam: BP 104/72 (BP Location: Left Arm, Patient Position: Sitting, Cuff Size: Large)   Pulse 70   Temp (!) 97.2 F (36.2 C) (Temporal)   Resp 20   Ht 5' 7.32" (1.71 m)   Wt 238 lb 3.2 oz (108 kg)   SpO2 100%   BMI 36.95 kg/m    Physical Exam Constitutional:      Appearance: Normal appearance.  HENT:     Head: Normocephalic and atraumatic.     Comments: Pharynx normal, eyes normal, ears normal, nose normal    Right Ear: Tympanic membrane, ear canal and external ear normal.     Left Ear: Tympanic membrane, ear canal and external ear normal.     Nose: Nose normal.     Mouth/Throat:     Mouth: Mucous membranes are moist.     Pharynx: Oropharynx is clear.  Eyes:     Conjunctiva/sclera: Conjunctivae normal.  Cardiovascular:     Rate and Rhythm: Normal rate and regular rhythm.     Heart sounds: Normal heart sounds.  Pulmonary:     Effort: Pulmonary effort is normal.     Breath sounds: Normal breath sounds.     Comments: Lungs clear to auscultation Musculoskeletal:     Cervical back: Neck supple.  Skin:    General: Skin is warm.     Comments: Eczematous lesions noted on bilateral antecubital fossa  Neurological:     Mental Status: She is alert and oriented to person, place, and time.  Psychiatric:        Mood and Affect: Mood normal.        Behavior: Behavior normal.        Thought Content: Thought content normal.        Judgment: Judgment normal.    Diagnostics: FVC 2.66 L (74%), FEV1 2.11 L (70%).  Predicted FVC 3.60 L, predicted FEV1 3.02 L.  Spirometry indicates possible mild restriction.  Postbronchodilator response shows FVC 2.82 L, FEV1 2.45 L.  There is a 16% change in FEV1.  Spirometry indicates normal respiratory function.  Assessment and Plan: 1. Viral upper respiratory infection   2. Moderate  persistent asthma, uncomplicated   3. Allergy with anaphylaxis due to food, subsequent encounter   4. Flexural atopic dermatitis     Meds ordered this encounter  Medications   fluticasone (FLOVENT HFA) 110 MCG/ACT inhaler    Sig: With worsening of asthma/upper respiratory infections start Flovent 110 mcg 2 puffs twice daily with spacer for 2 weeks or until symptoms return to baseline    Dispense:  1 each  Refill:  5   EPINEPHrine 0.3 mg/0.3 mL IJ SOAJ injection    Sig: Inject 0.3 mg into the muscle as needed for anaphylaxis.    Dispense:  2 each    Refill:  1    DISPENSE BRAND OR GENERIC MYLAN OR TEVA    Patient Instructions  Mild Intermittent Asthma:  - Daily controller medication(s): None currently  - Prior to physical activity: albuterol 2 puffs 10-15 minutes before physical activity. - Rescue medications: albuterol 4 puffs every 4-6 hours as needed - For now and during respiratory infections or worsening symptoms: Add onFlovent 110mcg to 2 puffs twice daily with spacer for TWO WEEKS. Refill prescription sent. Reviewed proper technique. Spacer given - Get Influenza Vaccine and appropriate Pneumonia and COVID 19 boosters  - Asthma control goals:  * Full participation in all desired activities (may need albuterol before activity) * Albuterol use two time or less a week on average (not counting use with activity) * Cough interfering with sleep two time or less a month * Oral steroids no more than once a year * No hospitalizations  Allergic Rhinitis  - Continue with: Claritin 10mg  daily  - Continue Singulair 10 mg daily  - You can use an extra dose of the antihistamine, if needed, for breakthrough symptoms.  - Consider nasal saline rinses 1-2 times daily to remove allergens from the nasal cavities as well as help with mucous clearance (this is especially helpful to do before the nasal sprays are given) -Continue Flonase (fluticasone) 1 to 2 sprays each nostril once a day as  needed for stuffy nose. In the right nostril, point the applicator out toward the right ear. In the left nostril, point the applicator out toward the left ear  Atopic Dermatitis  -Continue basic skin care  -Continue emollient therapy with vaseline  -Continue triamcinolone as needed to red itchy areas. Do not use triamcinolone on face, neck, groin, or armpit region  Allergy with anaphylaxis due to food, subsequent encounter - Continue avoidance of all milk products to include baked milk  - Continue to carry epinephrine on you at all times  - Follow food allergy action plans for any reactions   Follow up in 4 weeks with skin testing to environmental allergens. You will need to be off all antihistamines 3 days prior to this appointment       Return in about 4 weeks (around 01/18/2022), or if symptoms worsen or fail to improve, for skin testing environmental allergens.    Thank you for the opportunity to care for this patient.  Please do not hesitate to contact me with questions.  Nehemiah Settlehristine Lada Fulbright, FNP [facility]

## 2021-12-24 ENCOUNTER — Telehealth: Payer: Medicaid Other | Admitting: Physician Assistant

## 2021-12-24 DIAGNOSIS — B9689 Other specified bacterial agents as the cause of diseases classified elsewhere: Secondary | ICD-10-CM

## 2021-12-24 MED ORDER — AZITHROMYCIN 250 MG PO TABS: ORAL_TABLET | ORAL | 0 refills | Status: AC

## 2021-12-24 MED ORDER — PREDNISONE 10 MG (21) PO TBPK: ORAL_TABLET | ORAL | 0 refills | Status: DC

## 2021-12-24 NOTE — Progress Notes (Signed)
We are sorry that you are not feeling well.  Here is how we plan to help!  Based on your presentation I believe you most likely have A cough due to bacteria.  When patients have a fever and a productive cough with a change in color or increased sputum production, we are concerned about bacterial bronchitis.  If left untreated it can progress to pneumonia.  If your symptoms do not improve with your treatment plan it is important that you contact your provider.   I have prescribed Azithromyin 250 mg: two tablets now and then one tablet daily for 4 additonal days    In addition you may use  Prednisone 10 mg daily for 6 days (see taper instructions below)  Directions for 6 day taper: Day 1: 2 tablets before breakfast, 1 after both lunch & dinner and 2 at bedtime Day 2: 1 tab before breakfast, 1 after both lunch & dinner and 2 at bedtime Day 3: 1 tab at each meal & 1 at bedtime Day 4: 1 tab at breakfast, 1 at lunch, 1 at bedtime Day 5: 1 tab at breakfast & 1 tab at bedtime Day 6: 1 tab at breakfast  From your responses in the eVisit questionnaire you describe inflammation in the upper respiratory tract which is causing a significant cough.  This is commonly called Bronchitis and has four common causes:   Allergies Viral Infections Acid Reflux Bacterial Infection Allergies, viruses and acid reflux are treated by controlling symptoms or eliminating the cause. An example might be a cough caused by taking certain blood pressure medications. You stop the cough by changing the medication. Another example might be a cough caused by acid reflux. Controlling the reflux helps control the cough.  USE OF BRONCHODILATOR ("RESCUE") INHALERS: There is a risk from using your bronchodilator too frequently.  The risk is that over-reliance on a medication which only relaxes the muscles surrounding the breathing tubes can reduce the effectiveness of medications prescribed to reduce swelling and congestion of the  tubes themselves.  Although you feel brief relief from the bronchodilator inhaler, your asthma may actually be worsening with the tubes becoming more swollen and filled with mucus.  This can delay other crucial treatments, such as oral steroid medications. If you need to use a bronchodilator inhaler daily, several times per day, you should discuss this with your provider.  There are probably better treatments that could be used to keep your asthma under control.     HOME CARE Only take medications as instructed by your medical team. Complete the entire course of an antibiotic. Drink plenty of fluids and get plenty of rest. Avoid close contacts especially the very young and the elderly Cover your mouth if you cough or cough into your sleeve. Always remember to wash your hands A steam or ultrasonic humidifier can help congestion.   GET HELP RIGHT AWAY IF: You develop worsening fever. You become short of breath You cough up blood. Your symptoms persist after you have completed your treatment plan MAKE SURE YOU  Understand these instructions. Will watch your condition. Will get help right away if you are not doing well or get worse.    Thank you for choosing an e-visit.  Your e-visit answers were reviewed by a board certified advanced clinical practitioner to complete your personal care plan. Depending upon the condition, your plan could have included both over the counter or prescription medications.  Please review your pharmacy choice. Make sure the pharmacy is open  so you can pick up prescription now. If there is a problem, you may contact your provider through [employer] and have the prescription routed to another pharmacy.  Your safety is important to [place]. If you have drug allergies check your prescription carefully.   For the next 24 hours you can use MyChart to ask questions about today's visit, request a non-urgent call back, or ask for a work or school excuse. You will get an  email in the next two days asking about your experience. I hope that your e-visit has been valuable and will speed your recovery.  I provided 5 minutes of non face-to-face time during this encounter for chart review and documentation.

## 2022-01-13 IMAGING — CR DG CHEST 2V
2 series · 2 of 2 positions shown · non-contrast
Comparison: 07/23/2018

CLINICAL DATA: Shortness of breath and congestion for 2 days.

EXAM:
CHEST - 2 VIEW

[w chest pa]
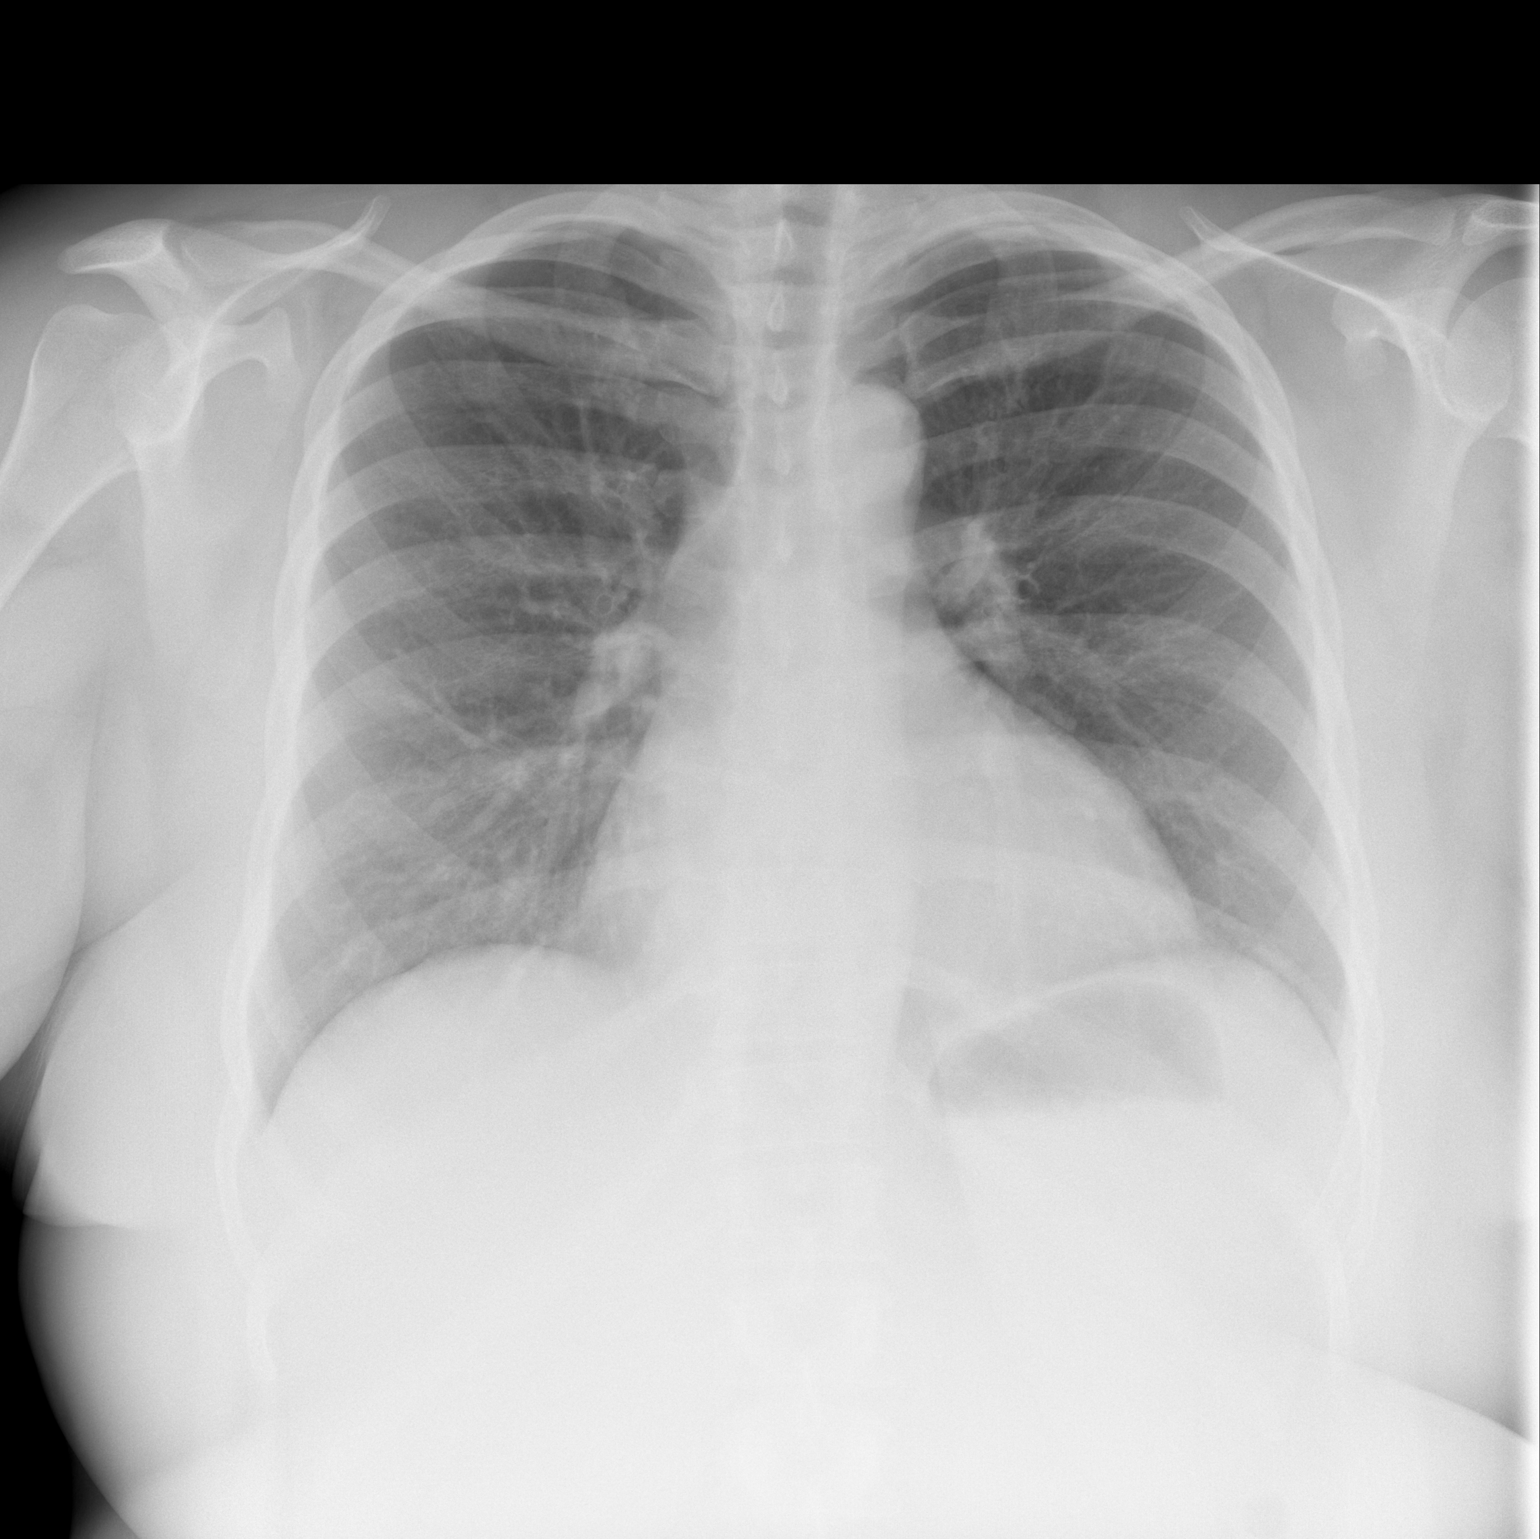

[w chest lat]
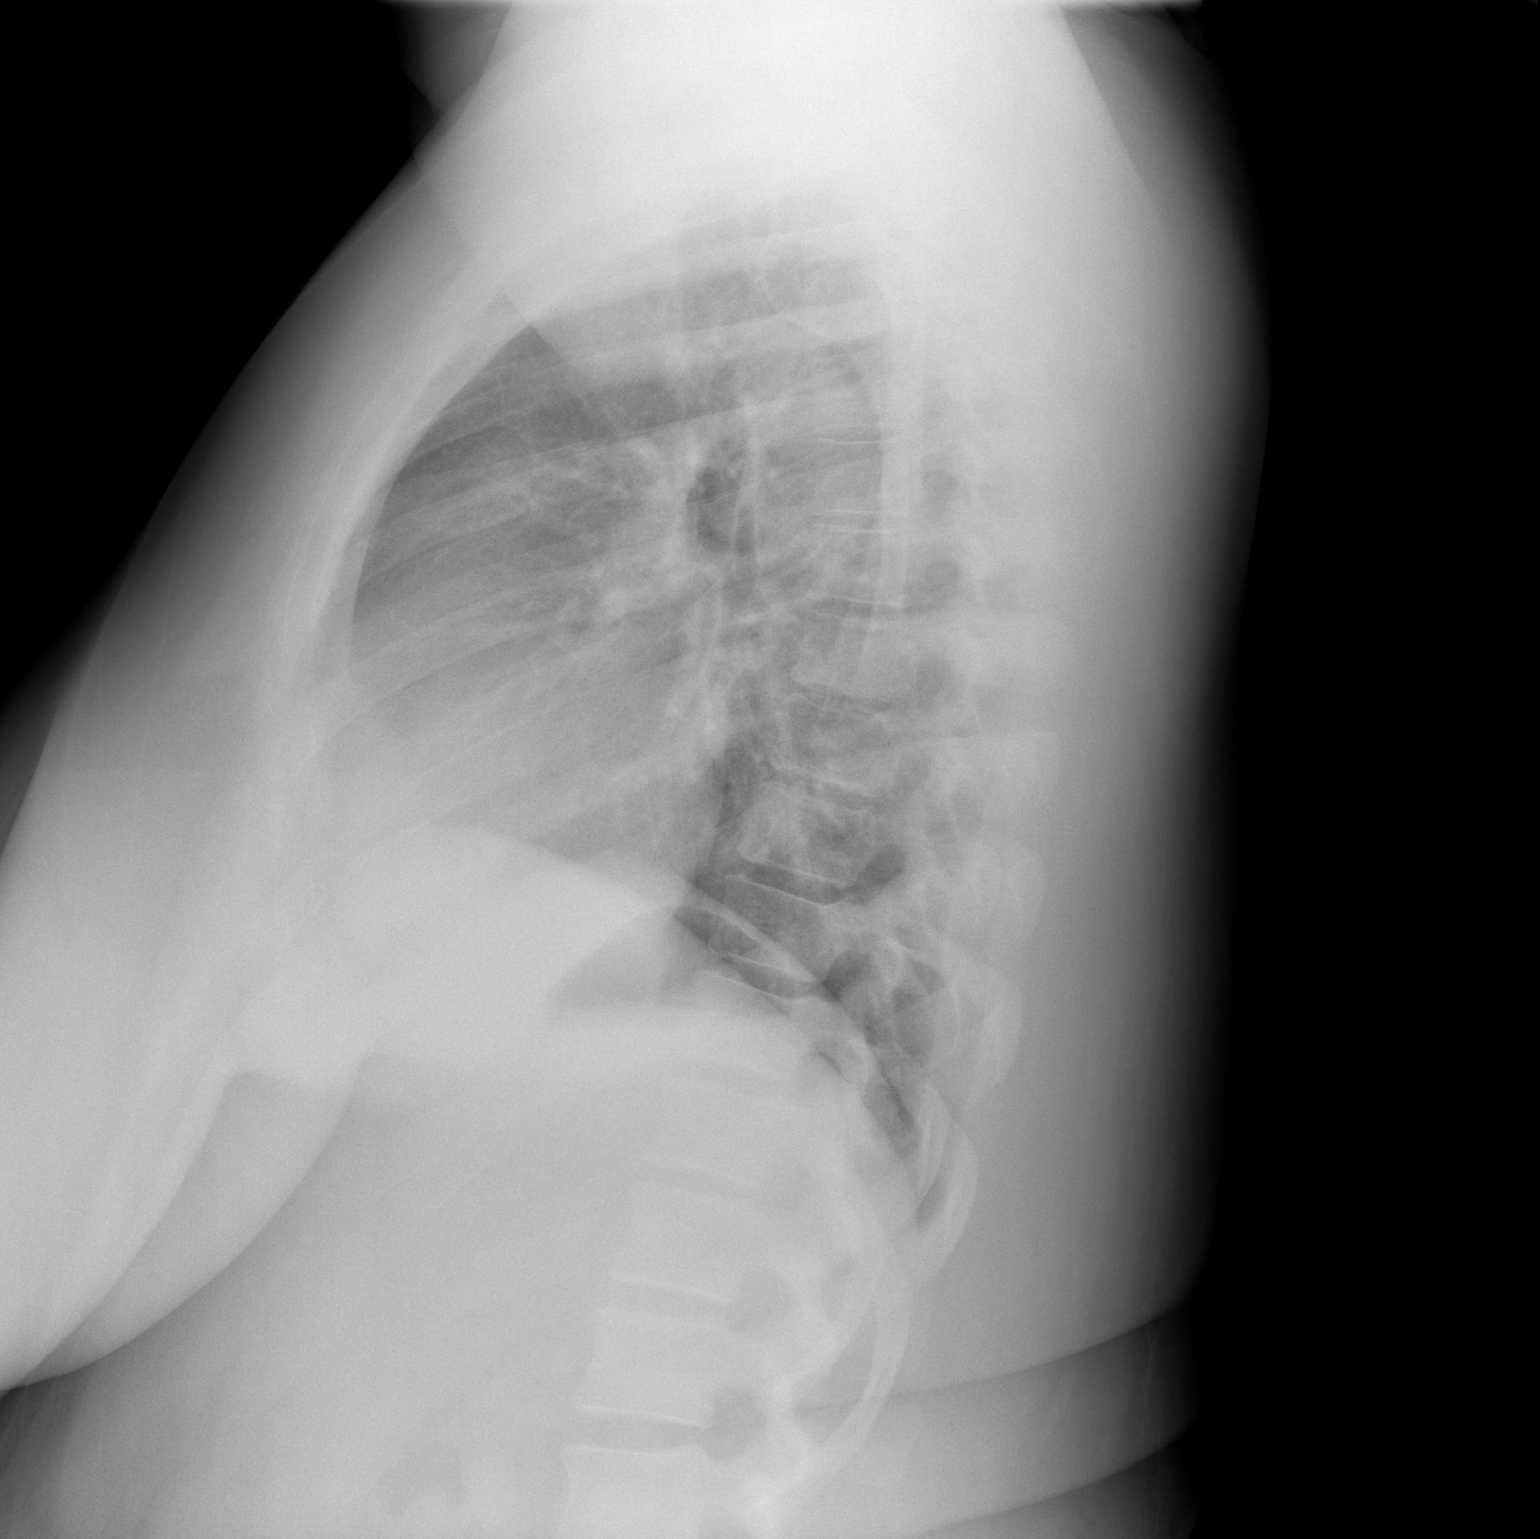

[2 of 2 positions shown; findings below may reference images not displayed]

FINDINGS: The cardiomediastinal contours are normal. Again seen mild central
bronchial thickening. Previous streaky left lung base opacity has
improved. Pulmonary vasculature is normal. No consolidation, pleural
effusion, or pneumothorax. No acute osseous abnormalities are seen.
IMPRESSION: Mild central bronchial thickening suggesting asthma or bronchitis.

## 2022-01-21 ENCOUNTER — Ambulatory Visit: Payer: Medicaid Other | Admitting: Family

## 2022-02-22 ENCOUNTER — Telehealth: Payer: Medicaid Other | Admitting: Physician Assistant

## 2022-02-22 DIAGNOSIS — J019 Acute sinusitis, unspecified: Secondary | ICD-10-CM

## 2022-02-22 DIAGNOSIS — B9689 Other specified bacterial agents as the cause of diseases classified elsewhere: Secondary | ICD-10-CM

## 2022-02-22 MED ORDER — AMOXICILLIN-POT CLAVULANATE 875-125 MG PO TABS: 1.0000 | ORAL_TABLET | Freq: Two times a day (BID) | ORAL | 0 refills | Status: DC

## 2022-02-22 NOTE — Progress Notes (Signed)

## 2022-02-22 NOTE — Progress Notes (Signed)
I have spent 5 minutes in review of e-visit questionnaire, review and updating patient chart, medical decision making and response to patient.   Zari Cly Cody Ming Kunka, PA-C    

## 2022-03-25 ENCOUNTER — Telehealth: Payer: Medicaid Other | Admitting: Family Medicine

## 2022-03-25 DIAGNOSIS — J3089 Other allergic rhinitis: Secondary | ICD-10-CM | POA: Diagnosis not present

## 2022-03-25 NOTE — Progress Notes (Signed)
E visit for Allergic Rhinitis We are sorry that you are not feeling well.  Here is how we plan to help!  Based on what you have shared with me it looks like you have Allergic Rhinitis.  Rhinitis is when a reaction occurs that causes nasal congestion, runny nose, sneezing, and itching.  Most types of rhinitis are caused by an inflammation and are associated with symptoms in the eyes ears or throat. There are several types of rhinitis.  The most common are acute rhinitis, which is usually caused by a viral illness, allergic or seasonal rhinitis, and nonallergic or year-round rhinitis.  Nasal allergies occur certain times of the year.  Allergic rhinitis is caused when allergens in the air trigger the release of histamine in the body.  Histamine causes itching, swelling, and fluid to build up in the fragile linings of the nasal passages, sinuses and eyelids.  An itchy nose and clear discharge are common.  I recommend the following over the counter treatments: Xyzal 5 mg take 1 tablet daily  I also would recommend a nasal spray: Flonase 2 sprays into each nostril once daily  You may also benefit from eye drops such as: Visine 1-2 drops each eye twice daily as needed  HOME CARE:  You can use an over-the-counter saline nasal spray as needed Avoid areas where there is heavy dust, mites, or molds Stay indoors on windy days during the pollen season Keep windows closed in home, at least in bedroom; use air conditioner. Use high-efficiency house air filter Keep windows closed in car, turn AC on re-circulate Avoid playing out with dog during pollen season  GET HELP RIGHT AWAY IF:  If your symptoms do not improve within 10 days You become short of breath You develop yellow or green discharge from your nose for over 3 days You have coughing fits  MAKE SURE YOU:  Understand these instructions Will watch your condition Will get help right away if you are not doing well or get worse  Thank you  for choosing an e-visit. Your e-visit answers were reviewed by a board certified advanced clinical practitioner to complete your personal care plan. Depending upon the condition, your plan could have included both over the counter or prescription medications. Please review your pharmacy choice. Be sure that the pharmacy you have chosen is open so that you can pick up your prescription now.  If there is a problem you may message your provider in MyChart to have the prescription routed to another pharmacy. Your safety is important to [place]. If you have drug allergies check your prescription carefully.  For the next 24 hours, you can use MyChart to ask questions about today's visit, request a non-urgent call back, or ask for a work or school excuse from your e-visit provider. You will get an email in the next two days asking about your experience. I hope that your e-visit has been valuable and will speed your recovery.  I have provided 5 minutes of non face to face time during this encounter for chart review and documentation.

## 2022-03-28 ENCOUNTER — Telehealth: Payer: Medicaid Other | Admitting: Physician Assistant

## 2022-03-28 DIAGNOSIS — J4541 Moderate persistent asthma with (acute) exacerbation: Secondary | ICD-10-CM

## 2022-03-28 MED ORDER — PREDNISONE 20 MG PO TABS: 40.0000 mg | ORAL_TABLET | Freq: Every day | ORAL | 0 refills | Status: DC

## 2022-03-28 MED ORDER — BENZONATATE 100 MG PO CAPS: 100.0000 mg | ORAL_CAPSULE | Freq: Three times a day (TID) | ORAL | 0 refills | Status: DC | PRN

## 2022-03-28 NOTE — Progress Notes (Signed)
Visit for Asthma  Based on what you have shared with me, it looks like you may have a flare up of your asthma.  Asthma is a chronic (ongoing) lung disease which results in airway obstruction, inflammation and hyper-responsiveness.   Asthma symptoms vary from person to person, with common symptoms including nighttime awakening and decreased ability to participate in normal activities as a result of shortness of breath. It is often triggered by changes in weather, changes in the season, changes in air temperature, or inside (home, school, daycare or work) allergens such as animal dander, mold, mildew, woodstoves or cockroaches.   It can also be triggered by hormonal changes, extreme emotion, physical exertion or an upper respiratory tract illness.     It is important to identify the trigger, and then eliminate or avoid the trigger if possible.   If you have been prescribed medications to be taken on a regular basis, it is important to follow the asthma action plan and to follow guidelines to adjust medication in response to increasing symptoms of decreased peak expiratory flow rate  Treatment: I have prescribed: Prednisone 40mg  by mouth per day for 5 - 7 days and Benzonatate (Tessalon Perles) 100mg  Take 1 capsule every 8 hours as needed for cough.   HOME CARE Only take medications as instructed by your medical team. Consider wearing a mask or scarf to improve breathing air temperature have been shown to decrease irritation and decrease exacerbations Get rest. Taking a steamy shower or using a humidifier may help nasal congestion sand ease sore throat pain. You can place a towel over your head and breathe in the steam from hot water coming from a faucet. Using a saline nasal spray works much the same way.  Cough drops, hare candies and sore throat lozenges may ease your cough.  Avoid close  contacts especially the very you and the elderly Cover your mouth if you cough or sneeze Always remember to wash your hands.    GET HELP RIGHT AWAY IF: You develop worsening symptoms; breathlessness at rest, drowsy, confused or agitated, unable to speak in full sentences You have coughing fits You develop a severe headache or visual changes You develop shortness of breath, difficulty breathing or start having chest pain Your symptoms persist after you have completed your treatment plan If your symptoms do not improve within 10 days  MAKE SURE YOU Understand these instructions. Will watch your condition. Will get help right away if you are not doing well or get worse.   Your e-visit answers were reviewed by a board certified advanced clinical practitioner to complete your personal care plan, Depending upon the condition, your plan could have included both over the counter or prescription medications.   Please review your pharmacy choice. Your safety is important to . If you have drug allergies check your prescription carefully.  You can use MyChart to ask questions about today's visit, request a non-urgent  call back, or ask for a work or school excuse for 24 hours related to this e-Visit. If it has been greater than 24 hours you will need to follow up with your provider, or enter a new e-Visit to address those concerns.   You will get an e-mail in the next two days asking about your experience. I hope that your e-visit has been valuable and will speed your recovery. Thank you for using e-visits.  I provided 5 minutes of non face-to-face time during this encounter for chart review and documentation.

## 2022-03-29 ENCOUNTER — Other Ambulatory Visit: Payer: Self-pay

## 2022-03-29 ENCOUNTER — Encounter (HOSPITAL_BASED_OUTPATIENT_CLINIC_OR_DEPARTMENT_OTHER): Payer: Self-pay

## 2022-03-29 ENCOUNTER — Emergency Department (HOSPITAL_BASED_OUTPATIENT_CLINIC_OR_DEPARTMENT_OTHER): Payer: Medicaid Other

## 2022-03-29 ENCOUNTER — Emergency Department (HOSPITAL_BASED_OUTPATIENT_CLINIC_OR_DEPARTMENT_OTHER)
Admission: EM | Admit: 2022-03-29 | Discharge: 2022-03-29 | Disposition: A | Discharge status: Home / Self Care | Payer: Medicaid Other | Attending: Emergency Medicine | Admitting: Emergency Medicine

## 2022-03-29 DIAGNOSIS — J45909 Unspecified asthma, uncomplicated: Secondary | ICD-10-CM | POA: Diagnosis not present

## 2022-03-29 DIAGNOSIS — Z20822 Contact with and (suspected) exposure to covid-19: Secondary | ICD-10-CM | POA: Diagnosis not present

## 2022-03-29 DIAGNOSIS — J069 Acute upper respiratory infection, unspecified: Secondary | ICD-10-CM | POA: Diagnosis not present

## 2022-03-29 DIAGNOSIS — R059 Cough, unspecified: Secondary | ICD-10-CM | POA: Diagnosis present

## 2022-03-29 LAB — RESP PANEL BY RT-PCR (FLU A&B, COVID) ARPGX2
Influenza A by PCR: NEGATIVE
Influenza B by PCR: NEGATIVE
SARS Coronavirus 2 by RT PCR: NEGATIVE

## 2022-03-29 MED ORDER — HYDROCODONE BIT-HOMATROP MBR 5-1.5 MG/5ML PO SOLN: 5.0000 mL | Freq: Four times a day (QID) | ORAL | 0 refills | Status: DC | PRN

## 2022-03-29 NOTE — Discharge Instructions (Signed)
Your work-up today was overall reassuring.  Exam without concerns.  Chest x-ray did not show evidence of pneumonia.  Your COVID and flu test were negative.  As discussed this likely started out as sinus congestion.  I have sent cough medicine into the pharmacy for you to use for severe coughing spells or at bedtime.  Predominantly I want you to use over-the-counter Mucinex with 1200 mg of guaifenesin, and 60 mg of dextromethorphan for your cough.  For any worsening symptoms please return to emergency room for evaluation otherwise follow-up with your primary care provider.  I do recommend that she do a sinus rinse as well.

## 2022-03-29 NOTE — ED Provider Notes (Signed)
[facility] EMERGENCY DEPARTMENT Provider Note   CSN: [id number] Arrival date & time: 03/29/22  0818     History  Chief Complaint  Patient presents with   Cough    Rachel Clay is a 31 y.o. female.  31 year old female presents today for evaluation of cough that started yesterday morning.  She does endorse sinus congestion with postnasal drainage over the weekend.  Denies fever, chest pain, shortness of breath.  Does have history of asthma but denies any wheezing.  Has not taken anything for symptoms prior to arrival.  Cough productive of clear sputum.  The history is provided by the patient. No language interpreter was used.       Home Medications Prior to Admission medications   Medication Sig Start Date End Date Taking? Authorizing Provider  HYDROcodone bit-homatropine (HYCODAN) 5-1.5 MG/5ML syrup Take 5 mLs by mouth every 6 (six) hours as needed for cough. 03/29/22  Yes Marjani Kobel, PA-C  azelastine (ASTELIN) 0.1 % nasal spray Place into the nose. 04/21/21   [provider]  baclofen (LIORESAL) 10 MG tablet Take 1 tablet (10 mg total) by mouth 3 (three) times daily. 12/19/21   Claiborne Rigg, NP  benzonatate (TESSALON) 100 MG capsule Take 1 capsule (100 mg total) by mouth 3 (three) times daily as needed. 03/28/22   Margaretann Loveless, PA-C  cetirizine (ZYRTEC) 10 MG tablet Take by mouth. 12/16/21 01/15/22  [provider]  clindamycin (CLEOCIN T) 1 % SWAB APPLY TO AFFECTED AREA TWICE A DAY AS NEEDED 11/11/21   [provider]  cyclobenzaprine (FLEXERIL) 10 MG tablet Take 1 tablet by mouth 3 (three) times daily as needed. 08/10/21   [provider]  EPINEPHrine 0.3 mg/0.3 mL IJ SOAJ injection Inject 0.3 mg into the muscle as needed for anaphylaxis. 12/21/21   Nehemiah Settle, FNP  EUCRISA 2 % OINT APPLY TO AFFECTED AREA TWICE A DAY AS NEEDED 10/05/20   [provider]  fluticasone (FLONASE) 50 MCG/ACT nasal spray Place 2 sprays into  both nostrils daily. 10/15/21   Daphine Deutscher Mary-Margaret, FNP  fluticasone (FLOVENT HFA) 110 MCG/ACT inhaler With worsening of asthma/upper respiratory infections start Flovent 110 mcg 2 puffs twice daily with spacer for 2 weeks or until symptoms return to baseline 12/21/21   Nehemiah Settle, FNP  ibuprofen (ADVIL) 800 MG tablet Take 1 tablet (800 mg total) by mouth every 8 (eight) hours as needed. DO NOT TAKE WITH NAPROXEN, ALEVE OR ADVIL 12/19/21   Claiborne Rigg, NP  ipratropium (ATROVENT) 0.03 % nasal spray Place into the nose. 09/07/21   [provider]  levalbuterol Pauline Aus HFA) 45 MCG/ACT inhaler Inhale 2 puffs into the lungs every 6 (six) hours as needed for wheezing. 10/12/20   Marcelyn Bruins, MD  levalbuterol Pauline Aus) 1.25 MG/3ML nebulizer solution Take 1.25 mg by nebulization every 4 (four) hours as needed for wheezing. 11/03/20   Marcelyn Bruins, MD  levonorgestrel (MIRENA) 20 MCG/24HR IUD by Intrauterine route.    [provider]  metFORMIN (GLUCOPHAGE-XR) 500 MG 24 hr tablet Take 500 mg by mouth 2 (two) times daily. 04/04/21   [provider]  metoprolol succinate (TOPROL-XL) 25 MG 24 hr tablet Take 12.5 mg by mouth daily. 05/09/21   [provider]  montelukast (SINGULAIR) 10 MG tablet Take by mouth. 06/15/21   [provider]  naproxen (NAPROSYN) 500 MG tablet Take 1 tablet (500 mg total) by mouth 2 (two) times daily with a meal. 08/10/21  Joycelyn Man M, PA-C  predniSONE (DELTASONE) 20 MG tablet Take 2 tablets (40 mg total) by mouth daily with breakfast. 03/28/22   Margaretann Loveless, PA-C  Pseudoephedrine-Guaifenesin [facility] D PO) Take by mouth.    [provider]  triamcinolone (KENALOG) 0.1 % APPLY TO AFFECTED AREA TWICE DAILY AS NEEDED 08/16/19   [provider]  Vitamin D, Ergocalciferol, (DRISDOL) 1.25 MG (50000 UNIT) CAPS capsule Take by mouth. 11/18/21   [provider]      Allergies     Milk-related compounds and Other    Review of Systems   Review of Systems  Constitutional:  Negative for chills and fever.  HENT:  Positive for congestion and postnasal drip. Negative for sinus pressure, sore throat and trouble swallowing.   Respiratory:  Positive for cough. Negative for shortness of breath.   Cardiovascular:  Negative for chest pain.  All other systems reviewed and are negative.   Physical Exam Updated Vital Signs BP (!) 137/100   Pulse 86   Temp 98.3 F (36.8 C) (Oral)   Resp 18   Ht 5\' 7"  (1.702 m)   Wt 108 kg   LMP 03/28/2022 (Approximate)   SpO2 100%   BMI 37.28 kg/m  Physical Exam Vitals and nursing note reviewed.  Constitutional:      General: She is not in acute distress.    Appearance: Normal appearance. She is not ill-appearing.  HENT:     Head: Normocephalic and atraumatic.     Right Ear: Tympanic membrane, ear canal and external ear normal. There is no impacted cerumen.     Left Ear: Tympanic membrane, ear canal and external ear normal. There is no impacted cerumen.     Nose: Nose normal.     Mouth/Throat:     Mouth: Mucous membranes are moist.     Pharynx: No oropharyngeal exudate or posterior oropharyngeal erythema.  Eyes:     Extraocular Movements: Extraocular movements intact.     Conjunctiva/sclera: Conjunctivae normal.  Cardiovascular:     Rate and Rhythm: Normal rate and regular rhythm.  Pulmonary:     Effort: Pulmonary effort is normal. No respiratory distress.     Breath sounds: Normal breath sounds. No wheezing or rales.  Abdominal:     Palpations: Abdomen is soft.  Musculoskeletal:        General: No deformity.  Skin:    Findings: No rash.  Neurological:     Mental Status: She is alert.     ED Results / Procedures / Treatments   Labs (all labs ordered are listed, but only abnormal results are displayed) Labs Reviewed  RESP PANEL BY RT-PCR (FLU A&B, COVID) ARPGX2    EKG None  Radiology DG Chest 2  View  Result Date: 03/29/2022 CLINICAL DATA:  Productive cough for 2 days. EXAM: CHEST - 2 VIEW COMPARISON:  03/18/2022 FINDINGS: The cardiac silhouette, mediastinal and hilar contours are within normal limits. The lungs are clear. No pleural effusions. No pulmonary lesions. The bony thorax is intact. IMPRESSION: No acute cardiopulmonary findings. Electronically Signed   By: 05/18/2022 M.D.   On: 03/29/2022 09:06    Procedures Procedures    Medications Ordered in ED Medications - No data to display  ED Course/ Medical Decision Making/ A&P                           Medical Decision Making Amount and/or Complexity of Data Reviewed Radiology: ordered.  Risk Prescription drug management.   Medical Decision Making / ED Course   This patient presents to the ED for concern of cough, this involves an extensive number of treatment options, and is a complaint that carries with it a high risk of complications and morbidity.  The differential diagnosis includes pneumonia, bronchitis, sinusitis, viral upper respiratory infection  MDM: 31 year old female presents today for evaluation of cough since yesterday.  She has had sinus congestion over the weekend.  Chest x-ray without evidence of pneumonia.  COVID and flu are negative.  Most likely patient has sinusitis.  Most likely viral in nature.  Symptomatic management discussed.  Hycodan prescribed for severe coughing spells.  Otherwise discussed use of Mucinex.  Discussed sinus rinse.  Patient voices understanding and is in agreement with plan.  I did ambulate patient within the room with pulse ox.  She remained satting between 98-100% on room air.  No tachycardia.  Patient denies shortness of breath during this ambulation trial.  She does have history of asthma but no wheezing on exam or history of wheezing during these episodes.  Is on maintenance inhaler.  Patient is appropriate for discharge.  She voices understanding and is in agreement with  plan.  Return precautions discussed.   Lab Tests: -I ordered, reviewed, and interpreted labs.   The pertinent results include:   Labs Reviewed  RESP PANEL BY RT-PCR (FLU A&B, COVID) ARPGX2      EKG  EKG Interpretation  Date/Time:    Ventricular Rate:    PR Interval:    QRS Duration:   QT Interval:    QTC Calculation:   R Axis:     Text Interpretation:           Imaging Studies ordered: I ordered imaging studies including CXR I independently visualized and interpreted imaging. I agree with the radiologist interpretation   Medicines ordered and prescription drug management: Meds ordered this encounter  Medications   HYDROcodone bit-homatropine (HYCODAN) 5-1.5 MG/5ML syrup    Sig: Take 5 mLs by mouth every 6 (six) hours as needed for cough.    Dispense:  120 mL    Refill:  0    Order Specific Question:   Supervising Provider    Answer:   MILLER, BRIAN [[id number]]    -I have reviewed the patients home medicines and have made adjustments as needed  Reevaluation: After the interventions noted above, I reevaluated the patient and found that they have :stayed the same  Co morbidities that complicate the patient evaluation  Past Medical History:  Diagnosis Date   Asthma    childhood   Eczema       Dispostion: Patient is appropriate for discharge.  Discharged in stable condition.  Return precautions discussed.  Final Clinical Impression(s) / ED Diagnoses Final diagnoses:  Viral URI with cough    Rx / DC Orders ED Discharge Orders          Ordered    HYDROcodone bit-homatropine (HYCODAN) 5-1.5 MG/5ML syrup  Every 6 hours PRN        03/29/22 0928              Marita Kansas, PA-C 03/29/22 0935    Mardene Sayer, MD 03/29/22 [phone]

## 2022-03-29 NOTE — ED Triage Notes (Signed)
Pt states her chest is "mucousy" since yesterday. Denies any fever. States she is a little sob.

## 2022-03-30 ENCOUNTER — Telehealth: Payer: Medicaid Other | Admitting: Physician Assistant

## 2022-03-30 DIAGNOSIS — R0602 Shortness of breath: Secondary | ICD-10-CM

## 2022-03-30 DIAGNOSIS — J069 Acute upper respiratory infection, unspecified: Secondary | ICD-10-CM

## 2022-03-30 NOTE — Progress Notes (Signed)
Because of progressing symptoms despite initial evaluation, I feel your condition warrants further evaluation and I recommend that you be seen in a face to face visit. You may need some breathing treatments in office to better open airways and repeat lung exam to determine next best course of action.   NOTE: There will be NO CHARGE for this eVisit   If you are having a true medical emergency please call 911.      For an urgent face to face visit, [place] has seven urgent care centers for your convenience:     [facility] Center at [facility] Directions [phone] [address] [place], [place] [address]    [facility] Health Urgent Care Center [facility]) Get Driving Directions [phone] [address] [place], [place] [address]  [facility] Health Urgent Care Center [facility]) Get Driving Directions [phone] [address] [place],  [place]  [address]  [facility] Health Urgent Care Center [facility] - at [employer] Directions  [phone] [id number] [place],  [place] [address]   [facility] Health Urgent Care at [facility] Get Driving Directions [phone] [address] [place], [place] [address]   [facility] Health Urgent Care at [facility] Get Driving Directions  [phone] [address] [place], [place] [address]   [facility] Health Urgent Care at [facility] Directions [phone] [address] [place], [place] [address]  Your MyChart E-visit questionnaire answers were reviewed by a board certified advanced clinical practitioner to complete your personal care plan based on your specific symptoms.  Thank you for using e-Visits.

## 2022-04-20 ENCOUNTER — Telehealth: Payer: Medicaid Other | Admitting: Nurse Practitioner

## 2022-04-20 DIAGNOSIS — B9789 Other viral agents as the cause of diseases classified elsewhere: Secondary | ICD-10-CM | POA: Diagnosis not present

## 2022-04-20 DIAGNOSIS — J329 Chronic sinusitis, unspecified: Secondary | ICD-10-CM

## 2022-04-20 MED ORDER — FLUTICASONE PROPIONATE 50 MCG/ACT NA SUSP: 2.0000 | Freq: Every day | NASAL | 6 refills | Status: DC

## 2022-04-20 NOTE — Progress Notes (Signed)
E-Visit for Sinus Problems  We are sorry that you are not feeling well.  Here is how we plan to help!  Based on what you have shared with me it looks like you have sinusitis.  Sinusitis is inflammation and infection in the sinus cavities of the head.  Based on your presentation I believe you most likely have Acute Viral Sinusitis.This is an infection most likely caused by a virus. There is not specific treatment for viral sinusitis other than to help you with the symptoms until the infection runs its course.  You may use an oral decongestant such as Mucinex D or if you have glaucoma or high blood pressure use plain Mucinex. Saline nasal spray help and can safely be used as often as needed for congestion, I have prescribed: Fluticasone nasal spray two sprays in each nostril once a day  Some authorities believe that zinc sprays or the use of Echinacea may shorten the course of your symptoms.  Sinus infections are not as easily transmitted as other respiratory infection, however we still recommend that you avoid close contact with loved ones, especially the very young and elderly.  Remember to wash your hands thoroughly throughout the day as this is the number one way to prevent the spread of infection!  Home Care: Only take medications as instructed by your medical team. Do not take these medications with alcohol. A steam or ultrasonic humidifier can help congestion.  You can place a towel over your head and breathe in the steam from hot water coming from a faucet. Avoid close contacts especially the very young and the elderly. Cover your mouth when you cough or sneeze. Always remember to wash your hands.  Get Help Right Away If: You develop worsening fever or sinus pain. You develop a severe head ache or visual changes. Your symptoms persist after you have completed your treatment plan.  Make sure you Understand these instructions. Will watch your condition. Will get help right away if you  are not doing well or get worse.   Thank you for choosing an e-visit.  Your e-visit answers were reviewed by a board certified advanced clinical practitioner to complete your personal care plan. Depending upon the condition, your plan could have included both over the counter or prescription medications.  Please review your pharmacy choice. Make sure the pharmacy is open so you can pick up prescription now. If there is a problem, you may contact your provider through MyChart messaging and have the prescription routed to another pharmacy.  Your safety is important to us. If you have drug allergies check your prescription carefully.   For the next 24 hours you can use MyChart to ask questions about today's visit, request a non-urgent call back, or ask for a work or school excuse. You will get an email in the next two days asking about your experience. I hope that your e-visit has been valuable and will speed your recovery.  Mary-Margaret Naimah Yingst, FNP   5-10 minutes spent reviewing and documenting in chart.  

## 2022-06-10 ENCOUNTER — Telehealth: Payer: Medicaid Other | Admitting: Family Medicine

## 2022-06-10 DIAGNOSIS — J069 Acute upper respiratory infection, unspecified: Secondary | ICD-10-CM | POA: Diagnosis not present

## 2022-06-10 MED ORDER — PREDNISONE 20 MG PO TABS: 20.0000 mg | ORAL_TABLET | Freq: Two times a day (BID) | ORAL | 0 refills | Status: DC

## 2022-06-10 NOTE — Progress Notes (Signed)
E-Visit for Sinus Problems  We are sorry that you are not feeling well.  Here is how we plan to help!  Based on what you have shared with me it looks like you have sinusitis.  Sinusitis is inflammation and infection in the sinus cavities of the head.  Based on your presentation I believe you most likely have Acute Viral Sinusitis.This is an infection most likely caused by a virus. There is not specific treatment for viral sinusitis other than to help you with the symptoms until the infection runs its course.  You may use an oral decongestant such as Mucinex D or if you have glaucoma or high blood pressure use plain Mucinex. Saline nasal spray help and can safely be used as often as needed for congestion, I have prescribed:prednisone.  Some authorities believe that zinc sprays or the use of Echinacea may shorten the course of your symptoms.  Sinus infections are not as easily transmitted as other respiratory infection, however we still recommend that you avoid close contact with loved ones, especially the very young and elderly.  Remember to wash your hands thoroughly throughout the day as this is the number one way to prevent the spread of infection!  Home Care: Only take medications as instructed by your medical team. Do not take these medications with alcohol. A steam or ultrasonic humidifier can help congestion.  You can place a towel over your head and breathe in the steam from hot water coming from a faucet. Avoid close contacts especially the very young and the elderly. Cover your mouth when you cough or sneeze. Always remember to wash your hands.  Get Help Right Away If: You develop worsening fever or sinus pain. You develop a severe head ache or visual changes. Your symptoms persist after you have completed your treatment plan.  Make sure you Understand these instructions. Will watch your condition. Will get help right away if you are not doing well or get worse.   Thank you for  choosing an e-visit.  Your e-visit answers were reviewed by a board certified advanced clinical practitioner to complete your personal care plan. Depending upon the condition, your plan could have included both over the counter or prescription medications.  Please review your pharmacy choice. Make sure the pharmacy is open so you can pick up prescription now. If there is a problem, you may contact your provider through [employer] and have the prescription routed to another pharmacy.  Your safety is important to [place]. If you have drug allergies check your prescription carefully.   For the next 24 hours you can use MyChart to ask questions about today's visit, request a non-urgent call back, or ask for a work or school excuse. You will get an email in the next two days asking about your experience. I hope that your e-visit has been valuable and will speed your recovery.    have provided 5 minutes of non face to face time during this encounter for chart review and documentation.

## 2022-06-11 ENCOUNTER — Telehealth: Payer: Self-pay | Admitting: Urgent Care

## 2022-06-11 DIAGNOSIS — B9789 Other viral agents as the cause of diseases classified elsewhere: Secondary | ICD-10-CM

## 2022-06-11 MED ORDER — IPRATROPIUM BROMIDE 0.03 % NA SOLN: 2.0000 | Freq: Two times a day (BID) | NASAL | 0 refills | Status: DC

## 2022-06-11 NOTE — Progress Notes (Signed)
E-Visit for Sinus Problems  We are sorry that you are not feeling well.  Here is how we plan to help!  Based on what you have shared with me it looks like you have sinusitis.  Sinusitis is inflammation and infection in the sinus cavities of the head.  Based on your presentation I believe you most likely have Acute Viral Sinusitis.This is an infection most likely caused by a virus. There is not specific treatment for viral sinusitis other than to help you with the symptoms until the infection runs its course.  You may use an oral decongestant such as Mucinex D or if you have glaucoma or high blood pressure use plain Mucinex. Saline nasal spray help and can safely be used as often as needed for congestion, I have prescribed: Ipratropium Bromide nasal spray 0.03% 2 sprays in eah nostril 2-3 times a day  ALL forms of steroids have the same possible side effects. If prednisone causes palpitations, then other steroids would also not be recommended.   Please instead use the Atrovent Nasal spray called in today. [employer] would also provide you with significant relief, as would the Vicks Warm mist vaporizer placed over your bed at night with some eucalyptus pads.  Some authorities believe that zinc sprays or the use of Echinacea may shorten the course of your symptoms.  Sinus infections are not as easily transmitted as other respiratory infection, however we still recommend that you avoid close contact with loved ones, especially the very young and elderly.  Remember to wash your hands thoroughly throughout the day as this is the number one way to prevent the spread of infection!  Home Care: Only take medications as instructed by your medical team. Do not take these medications with alcohol. A steam or ultrasonic humidifier can help congestion.  You can place a towel over your head and breathe in the steam from hot water coming from a faucet. Avoid close contacts especially the very young and  the elderly. Cover your mouth when you cough or sneeze. Always remember to wash your hands.  Get Help Right Away If: You develop worsening fever or sinus pain. You develop a severe head ache or visual changes. Your symptoms persist after you have completed your treatment plan.  Make sure you Understand these instructions. Will watch your condition. Will get help right away if you are not doing well or get worse.   Thank you for choosing an e-visit.  Your e-visit answers were reviewed by a board certified advanced clinical practitioner to complete your personal care plan. Depending upon the condition, your plan could have included both over the counter or prescription medications.  Please review your pharmacy choice. Make sure the pharmacy is open so you can pick up prescription now. If there is a problem, you may contact your provider through [employer] and have the prescription routed to another pharmacy.  Your safety is important to [place]. If you have drug allergies check your prescription carefully.   For the next 24 hours you can use MyChart to ask questions about today's visit, request a non-urgent call back, or ask for a work or school excuse. You will get an email in the next two days asking about your experience. I hope that your e-visit has been valuable and will speed your recovery.    I have spent 5 minutes in review of e-visit questionnaire, review and updating patient chart, medical decision making and response to patient.   Arpi Diebold L  [place], [place]

## 2022-06-14 ENCOUNTER — Telehealth: Payer: Medicaid Other | Admitting: Physician Assistant

## 2022-06-14 DIAGNOSIS — B9689 Other specified bacterial agents as the cause of diseases classified elsewhere: Secondary | ICD-10-CM

## 2022-06-14 MED ORDER — BENZONATATE 100 MG PO CAPS: 100.0000 mg | ORAL_CAPSULE | Freq: Three times a day (TID) | ORAL | 0 refills | Status: DC | PRN

## 2022-06-14 MED ORDER — DOXYCYCLINE HYCLATE 100 MG PO TABS: 100.0000 mg | ORAL_TABLET | Freq: Two times a day (BID) | ORAL | 0 refills | Status: DC

## 2022-06-14 NOTE — Progress Notes (Signed)

## 2022-06-14 NOTE — Progress Notes (Signed)
I have spent 5 minutes in review of e-visit questionnaire, review and updating patient chart, medical decision making and response to patient.   Bao Coreas Cody Sabrine Patchen, PA-C    

## 2022-07-06 ENCOUNTER — Telehealth: Payer: Medicaid Other | Admitting: Family Medicine

## 2022-07-06 DIAGNOSIS — T887XXA Unspecified adverse effect of drug or medicament, initial encounter: Secondary | ICD-10-CM

## 2022-07-06 DIAGNOSIS — F419 Anxiety disorder, unspecified: Secondary | ICD-10-CM

## 2022-07-06 NOTE — Progress Notes (Signed)
[place]   Needs to reach out to PCP regarding medication side effects. Has stopped the medication as of last night- per EV comment

## 2022-07-09 ENCOUNTER — Telehealth: Payer: Self-pay | Admitting: Urgent Care

## 2022-07-09 DIAGNOSIS — F419 Anxiety disorder, unspecified: Secondary | ICD-10-CM

## 2022-07-09 NOTE — Progress Notes (Signed)
   Thank you for the details you included in the comment boxes. Those details are very helpful in determining the best course of treatment for you and help [place] to provide the best care. Because of the anxiety you are describing,  we recommend that you convert this visit to a video visit in order for the provider to better assess what is going on.  The provider will be able to give you a more accurate diagnosis and treatment plan if we can more freely discuss your symptoms and with the addition of a virtual examination. Additionally, anxiety medication cannot be prescribed through an evisit.  If you convert to a video visit, we will bill your insurance (similar to an office visit) and you will not be charged for this e-Visit. You will be able to stay at home and speak with the first available [facility] advanced practice provider. The link to do a video visit is in the drop down Menu tab of your Welcome screen in MyChart.

## 2022-07-21 ENCOUNTER — Telehealth: Payer: Medicaid Other | Admitting: Family

## 2022-07-21 DIAGNOSIS — M545 Low back pain, unspecified: Secondary | ICD-10-CM | POA: Diagnosis not present

## 2022-07-21 MED ORDER — BACLOFEN 10 MG PO TABS: 10.0000 mg | ORAL_TABLET | Freq: Three times a day (TID) | ORAL | 0 refills | Status: DC

## 2022-07-21 MED ORDER — NAPROXEN 500 MG PO TABS: 500.0000 mg | ORAL_TABLET | Freq: Two times a day (BID) | ORAL | 0 refills | Status: DC

## 2022-07-21 NOTE — Progress Notes (Signed)

## 2022-08-01 ENCOUNTER — Telehealth: Payer: Medicaid Other | Admitting: Physician Assistant

## 2022-08-01 DIAGNOSIS — K047 Periapical abscess without sinus: Secondary | ICD-10-CM

## 2022-08-01 MED ORDER — AMOXICILLIN-POT CLAVULANATE 875-125 MG PO TABS: 1.0000 | ORAL_TABLET | Freq: Two times a day (BID) | ORAL | 0 refills | Status: DC

## 2022-08-01 NOTE — Progress Notes (Signed)
I have spent 5 minutes in review of e-visit questionnaire, review and updating patient chart, medical decision making and response to patient.   Daina Cara Cody Ellard Nan, PA-C    

## 2022-08-01 NOTE — Progress Notes (Signed)

## 2022-09-05 ENCOUNTER — Telehealth: Payer: Medicaid Other | Admitting: Physician Assistant

## 2022-09-05 DIAGNOSIS — B9789 Other viral agents as the cause of diseases classified elsewhere: Secondary | ICD-10-CM | POA: Diagnosis not present

## 2022-09-05 DIAGNOSIS — J019 Acute sinusitis, unspecified: Secondary | ICD-10-CM | POA: Diagnosis not present

## 2022-09-06 ENCOUNTER — Encounter: Payer: Medicaid Other | Admitting: Physician Assistant

## 2022-09-06 MED ORDER — AZELASTINE HCL 0.1 % NA SOLN: 1.0000 | Freq: Two times a day (BID) | NASAL | 0 refills | Status: AC

## 2022-09-06 NOTE — Progress Notes (Signed)
E-Visit for Sinus Problems  We are sorry that you are not feeling well.  Here is how we plan to help!  Based on what you have shared with me it looks like you have sinusitis.  Sinusitis is inflammation and infection in the sinus cavities of the head.  Based on your presentation I believe you most likely have Acute Viral Sinusitis.This is an infection most likely caused by a virus. There is not specific treatment for viral sinusitis other than to help you with the symptoms until the infection runs its course.  You may use an oral decongestant such as Mucinex D or if you have glaucoma or high blood pressure use plain Mucinex. Saline nasal spray help and can safely be used as often as needed for congestion, I have prescribed: Azelastine nasal spray 2 sprays in each nostril twice a day, can use together with Fluticasone (Flonase).  Some authorities believe that zinc sprays or the use of Echinacea may shorten the course of your symptoms.  Sinus infections are not as easily transmitted as other respiratory infection, however we still recommend that you avoid close contact with loved ones, especially the very young and elderly.  Remember to wash your hands thoroughly throughout the day as this is the number one way to prevent the spread of infection!  Home Care: Only take medications as instructed by your medical team. Do not take these medications with alcohol. A steam or ultrasonic humidifier can help congestion.  You can place a towel over your head and breathe in the steam from hot water coming from a faucet. Avoid close contacts especially the very young and the elderly. Cover your mouth when you cough or sneeze. Always remember to wash your hands.  Get Help Right Away If: You develop worsening fever or sinus pain. You develop a severe head ache or visual changes. Your symptoms persist after you have completed your treatment plan.  Make sure you Understand these instructions. Will watch your  condition. Will get help right away if you are not doing well or get worse.   Thank you for choosing an e-visit.  Your e-visit answers were reviewed by a board certified advanced clinical practitioner to complete your personal care plan. Depending upon the condition, your plan could have included both over the counter or prescription medications.  Please review your pharmacy choice. Make sure the pharmacy is open so you can pick up prescription now. If there is a problem, you may contact your provider through [employer] and have the prescription routed to another pharmacy.  Your safety is important to [place]. If you have drug allergies check your prescription carefully.   For the next 24 hours you can use MyChart to ask questions about today's visit, request a non-urgent call back, or ask for a work or school excuse. You will get an email in the next two days asking about your experience. I hope that your e-visit has been valuable and will speed your recovery.  I have spent 5 minutes in review of e-visit questionnaire, review and updating patient chart, medical decision making and response to patient.   Mar Daring, PA-C

## 2022-09-06 NOTE — Progress Notes (Signed)
Duplicate, marked erroneous

## 2022-09-06 NOTE — Telephone Encounter (Signed)
This encounter was created in error - please disregard.

## 2022-09-10 ENCOUNTER — Telehealth: Payer: Medicaid Other | Admitting: Nurse Practitioner

## 2022-09-10 DIAGNOSIS — J0100 Acute maxillary sinusitis, unspecified: Secondary | ICD-10-CM

## 2022-09-10 MED ORDER — AMOXICILLIN-POT CLAVULANATE 875-125 MG PO TABS: 1.0000 | ORAL_TABLET | Freq: Two times a day (BID) | ORAL | 0 refills | Status: DC

## 2022-09-10 NOTE — Progress Notes (Signed)

## 2022-09-30 ENCOUNTER — Ambulatory Visit: Payer: Medicaid Other | Admitting: Family

## 2022-10-10 NOTE — Progress Notes (Signed)
   [place] [id number] Dept: [phone]  FOLLOW UP NOTE  Patient ID: Rachel Clay, female    DOB: 29-Jul-1990  Age: 32 y.o. MRN: [id number] Date of Office Visit: 10/11/2022  Assessment  Chief Complaint: No chief complaint on file.  HPI Rachel Clay is a 32 year old female who presents to the clinic for follow-up visit.  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   Drug Allergies:  Allergies  Allergen Reactions   Milk-Related Compounds Shortness Of Breath    dairy   Other Shortness Of Breath    dairy    Physical Exam: There were no vitals taken for this visit.   Physical Exam  Diagnostics:    Assessment and Plan: No diagnosis found.  No orders of the defined types were placed in this encounter.   There are no Patient Instructions on file for this visit.  No follow-ups on file.    Thank you for the opportunity to care for this patient.  Please do not hesitate to contact me with questions.  Gareth Morgan, FNP [facility]

## 2022-10-10 NOTE — Patient Instructions (Incomplete)
Asthma Increase montelukast to 10 mg once every day to prevent cough or wheeze Continue albuterol 2 puffs once every 4 hours as needed for cough or wheeze You may use albuterol 2 puffs 5 to 15 minutes before activity to decrease cough or wheeze For asthma flare, begin Qvar 40-1 puff twice a day for 2 weeks or until cough and wheeze free, then stop  Chronic rhinitis Your environmental allergy skin testing was positive to grass pollen, weed pollen, tree pollen, mold, dust mite, dog, cat, and cockroach.  Avoidance measures are listed below Begin cetirizine 10 mg once a day as needed for runny nose or itch. Remember to rotate to a different antihistamine about every 3 months. Some examples of over the counter antihistamines include Zyrtec (cetirizine), Xyzal (levocetirizine), Allegra (fexofenadine), and Claritin (loratidine).  Continue Flonase 2 sprays in each nostril once a day as needed for stuffy nose Consider saline nasal rinses as needed for nasal symptoms. Use this before any medicated nasal sprays for best result Consider allergen immunotherapy if your allergy symptoms are not well controlled with the treatment plan as listed above  Atopic dermatitis Continue a twice a day moisturizing routine For red or itchy areas below your face continue triamcinolone up to twice a day as needed.  Do not use this medication for longer than 2 weeks in a row  Food allergy Continue to avoid milk and all forms.  In case of an allergic reaction, take Benadryl 50 mg every 4 hours, and if life-threatening symptoms occur, inject with EpiPen 0.3 mg.  Call the clinic if this treatment plan is not working well for you.  Follow up in 3 months or sooner if needed.  Reducing Pollen Exposure The American Academy of Allergy, Asthma and Immunology suggests the following steps to reduce your exposure to pollen during allergy seasons. Do not hang sheets or clothing out to dry; pollen may collect on these items. Do not  mow lawns or spend time around freshly cut grass; mowing stirs up pollen. Keep windows closed at night.  Keep car windows closed while driving. Minimize morning activities outdoors, a time when pollen counts are usually at their highest. Stay indoors as much as possible when pollen counts or humidity is high and on windy days when pollen tends to remain in the air longer. Use air conditioning when possible.  Many air conditioners have filters that trap the pollen spores. Use a HEPA room air filter to remove pollen form the indoor air you breathe.  Control of Mold Allergen Mold and fungi can grow on a variety of surfaces provided certain temperature and moisture conditions exist.  Outdoor molds grow on plants, decaying vegetation and soil.  The major outdoor mold, Alternaria and Cladosporium, are found in very high numbers during hot and dry conditions.  Generally, a late Summer - Fall peak is seen for common outdoor fungal spores.  Rain will temporarily lower outdoor mold spore count, but counts rise rapidly when the rainy period ends.  The most important indoor molds are Aspergillus and Penicillium.  Dark, humid and poorly ventilated basements are ideal sites for mold growth.  The next most common sites of mold growth are the bathroom and the kitchen.  Outdoor [employer] Use air conditioning and keep windows closed Avoid exposure to decaying vegetation. Avoid leaf raking. Avoid grain handling. Consider wearing a face mask if working in moldy areas.  Indoor Mold Control Maintain humidity below 50%. Clean washable surfaces with 5% bleach solution. Remove sources  e.g. Contaminated carpets.   Control of Dust Mite Allergen Dust mites play a major role in allergic asthma and rhinitis. They occur in environments with high humidity wherever human skin is found. Dust mites absorb humidity from the atmosphere (ie, they do not drink) and feed on organic matter (including shed human and animal skin).  Dust mites are a microscopic type of insect that you cannot see with the naked eye. High levels of dust mites have been detected from mattresses, pillows, carpets, upholstered furniture, bed covers, clothes, soft toys and any woven material. The principal allergen of the dust mite is found in its feces. A gram of dust may contain 1,000 mites and 250,000 fecal particles. Mite antigen is easily measured in the air during house cleaning activities. Dust mites do not bite and do not cause harm to humans, other than by triggering allergies/asthma.  Ways to decrease your exposure to dust mites in your home:  1. Encase mattresses, box springs and pillows with a mite-impermeable barrier or cover  2. Wash sheets, blankets and drapes weekly in hot water (130 F) with detergent and dry them in a dryer on the hot setting.  3. Have the room cleaned frequently with a vacuum cleaner and a damp dust-mop. For carpeting or rugs, vacuuming with a vacuum cleaner equipped with a high-efficiency particulate air (HEPA) filter. The dust mite allergic individual should not be in a room which is being cleaned and should wait 1 hour after cleaning before going into the room.  4. Do not sleep on upholstered furniture (eg, couches).  5. If possible removing carpeting, upholstered furniture and drapery from the home is ideal. Horizontal blinds should be eliminated in the rooms where the person spends the most time (bedroom, study, television room). Washable vinyl, roller-type shades are optimal.  6. Remove all non-washable stuffed toys from the bedroom. Wash stuffed toys weekly like sheets and blankets above.  7. Reduce indoor humidity to less than 50%. Inexpensive humidity monitors can be purchased at most hardware stores. Do not use a humidifier as can make the problem worse and are not recommended.  Control of Dog or Cat Allergen Avoidance is the best way to manage a dog or cat allergy. If you have a dog or cat and are  allergic to dog or cats, consider removing the dog or cat from the home. If you have a dog or cat but don't want to find it a new home, or if your family wants a pet even though someone in the household is allergic, here are some strategies that may help keep symptoms at bay:  Keep the pet out of your bedroom and restrict it to only a few rooms. Be advised that keeping the dog or cat in only one room will not limit the allergens to that room. Don't pet, hug or kiss the dog or cat; if you do, wash your hands with soap and water. High-efficiency particulate air (HEPA) cleaners run continuously in a bedroom or living room can reduce allergen levels over time. Regular use of a high-efficiency vacuum cleaner or a central vacuum can reduce allergen levels. Giving your dog or cat a bath at least once a week can reduce airborne allergen.  Control of Cockroach Allergen Cockroach allergen has been identified as an important cause of acute attacks of asthma, especially in urban settings.  There are fifty-five species of cockroach that exist in the [place], however only three, the [place], [place] and [place] species produce allergen that  can affect patients with Asthma.  Allergens can be obtained from fecal particles, egg casings and secretions from cockroaches.    Remove food sources. Reduce access to water. Seal access and entry points. Spray runways with 0.5-1% Diazinon or Chlorpyrifos Blow boric acid power under stoves and refrigerator. Place bait stations (hydramethylnon) at feeding sites.

## 2022-10-11 ENCOUNTER — Ambulatory Visit (INDEPENDENT_AMBULATORY_CARE_PROVIDER_SITE_OTHER): Payer: Medicaid Other | Admitting: Family Medicine

## 2022-10-11 ENCOUNTER — Encounter: Payer: Self-pay | Admitting: Family Medicine

## 2022-10-11 VITALS — BP 132/84 | HR 75 | Temp 97.7°F | Resp 16 | Ht 67.72 in | Wt 238.8 lb

## 2022-10-11 DIAGNOSIS — J4540 Moderate persistent asthma, uncomplicated: Secondary | ICD-10-CM | POA: Diagnosis not present

## 2022-10-11 DIAGNOSIS — J3089 Other allergic rhinitis: Secondary | ICD-10-CM | POA: Diagnosis not present

## 2022-10-11 DIAGNOSIS — T7800XD Anaphylactic reaction due to unspecified food, subsequent encounter: Secondary | ICD-10-CM | POA: Diagnosis not present

## 2022-10-11 DIAGNOSIS — L2089 Other atopic dermatitis: Secondary | ICD-10-CM

## 2022-10-11 DIAGNOSIS — J302 Other seasonal allergic rhinitis: Secondary | ICD-10-CM | POA: Insufficient documentation

## 2022-10-11 MED ORDER — EPINEPHRINE 0.3 MG/0.3ML IJ SOAJ: 0.3000 mg | INTRAMUSCULAR | 1 refills | Status: AC | PRN

## 2022-10-11 MED ORDER — QVAR REDIHALER 40 MCG/ACT IN AERB: 2.0000 | INHALATION_SPRAY | Freq: Two times a day (BID) | RESPIRATORY_TRACT | 5 refills | Status: AC

## 2022-10-11 MED ORDER — CETIRIZINE HCL 10 MG PO TABS: 10.0000 mg | ORAL_TABLET | Freq: Every day | ORAL | 5 refills | Status: AC

## 2022-10-11 MED ORDER — MONTELUKAST SODIUM 10 MG PO TABS: 10.0000 mg | ORAL_TABLET | Freq: Every day | ORAL | 5 refills | Status: AC

## 2022-10-12 ENCOUNTER — Telehealth: Payer: Self-pay

## 2022-10-12 NOTE — Telephone Encounter (Signed)
PA initially denied, Appeal submitted via fax to [facility] and is pending determination.

## 2022-10-12 NOTE — Telephone Encounter (Signed)
PA request received via CMM for Qvar RediHaler 40MCG/ACT aerosol  PA has been submitted to [facility] and is pending determination.  Key: [id number]

## 2022-10-13 NOTE — Telephone Encounter (Signed)
Patient Advocate Encounter  Prior Authorization Appeal for Qvar RediHaler 40MCG/ACT aerosol has been approved through [facility].    KeyRaul Del  Effective: 10-12-2022 to 07-17-2098  Appeal approval letter attached to file

## 2022-10-18 ENCOUNTER — Telehealth: Payer: Medicaid Other | Admitting: Family Medicine

## 2022-10-18 DIAGNOSIS — N898 Other specified noninflammatory disorders of vagina: Secondary | ICD-10-CM | POA: Diagnosis not present

## 2022-10-18 MED ORDER — METRONIDAZOLE 500 MG PO TABS
500.0000 mg | ORAL_TABLET | Freq: Two times a day (BID) | ORAL | 0 refills | Status: AC
Start: 2022-10-18 — End: 2022-10-25

## 2022-10-18 NOTE — Progress Notes (Signed)

## 2022-11-03 ENCOUNTER — Other Ambulatory Visit (HOSPITAL_COMMUNITY): Payer: Self-pay

## 2022-11-03 ENCOUNTER — Telehealth: Payer: Self-pay

## 2022-11-03 NOTE — Telephone Encounter (Signed)
PA request received via CMM for EPINEPHrine 0.3MG /0.3ML auto-injectors  Key: [id number]  *per test claim PA is not needed, insurance will only cover certain NDC's

## 2022-11-12 ENCOUNTER — Telehealth: Payer: Medicaid Other | Admitting: Family Medicine

## 2022-11-12 DIAGNOSIS — R42 Dizziness and giddiness: Secondary | ICD-10-CM

## 2022-11-12 NOTE — Progress Notes (Signed)
Because Rachel Clay, I feel your condition warrants further evaluation and I recommend that you be seen in a face to face visit.   NOTE: There will be NO CHARGE for this eVisit   If you are having a true medical emergency please call 911.      For an urgent face to face visit, [place] has eight urgent care centers for your convenience:   NEW!! [facility] [place] Get Driving Directions [phone] [address] [place], [address]    [facility] [place] Get Driving Directions [phone] [address] [place], [place] [address]   [facility] ([place]) Get Driving Directions [phone] [address] [place], [place] [address]  [facility]) Get Driving Directions [phone] [address] [place],  [place]  [address]  [facility] ([place] [facility] [place] Get Driving Directions  [phone] [address] [place],  [place] [address]   [facility] Get Driving Directions [phone] [address] [place], [place] [address]   [facility] [place] Get Driving Directions  [phone] [address] [place],  [address]   [facility] [place] Get Driving Directions [phone] [address] [place],  [address]  Your MyChart E-visit questionnaire answers were reviewed by a board certified advanced clinical practitioner to complete your personal care plan based on your specific symptoms.  Thank you for using e-Visits.     have provided 5 minutes of non face to face time during this encounter for chart review and documentation.   

## 2022-12-05 ENCOUNTER — Telehealth: Payer: Medicaid Other | Admitting: Nurse Practitioner

## 2022-12-05 DIAGNOSIS — K0889 Other specified disorders of teeth and supporting structures: Secondary | ICD-10-CM

## 2022-12-05 NOTE — Progress Notes (Signed)
Rachel Clay,  If you still feel you need to be seen please reschedule a new E-visit and please include additional details regarding symptoms in the comment box  You will not be charged for this E-visit

## 2022-12-06 ENCOUNTER — Telehealth: Payer: Medicaid Other | Admitting: Physician Assistant

## 2022-12-06 DIAGNOSIS — K047 Periapical abscess without sinus: Secondary | ICD-10-CM | POA: Diagnosis not present

## 2022-12-06 MED ORDER — PENICILLIN V POTASSIUM 500 MG PO TABS
500.0000 mg | ORAL_TABLET | Freq: Three times a day (TID) | ORAL | 0 refills | Status: AC
Start: 2022-12-06 — End: 2022-12-13

## 2022-12-06 NOTE — Progress Notes (Signed)
E-Visit for Dental Pain  We are sorry that you are not feeling well.  Here is how we plan to help!  Based on what you have shared with me in the questionnaire, it sounds like you have a dental infection.  Pen VK 500mg 3 times a day for 7 days  It is imperative that you see a dentist within 10 days of this eVisit to determine the cause of the dental pain and be sure it is adequately treated  A toothache or tooth pain is caused when the nerve in the root of a tooth or surrounding a tooth is irritated. Dental (tooth) infection, decay, injury, or loss of a tooth are the most common causes of dental pain. Pain may also occur after an extraction (tooth is pulled out). Pain sometimes originates from other areas and radiates to the jaw, thus appearing to be tooth pain.Bacteria growing inside your mouth can contribute to gum disease and dental decay, both of which can cause pain. A toothache occurs from inflammation of the central portion of the tooth called pulp. The pulp contains nerve endings that are very sensitive to pain. Inflammation to the pulp or pulpitis may be caused by dental cavities, trauma, and infection.    HOME CARE:   For toothaches: Over-the-counter pain medications such as acetaminophen or ibuprofen may be used. Take these as directed on the package while you arrange for a dental appointment. Avoid very cold or hot foods, because they may make the pain worse. You may get relief from biting on a cotton ball soaked in oil of cloves. You can get oil of cloves at most drug stores.  For jaw pain:  Aspirin may be helpful for problems in the joint of the jaw in adults. If pain happens every time you open your mouth widely, the temporomandibular joint (TMJ) may be the source of the pain. Yawning or taking a large bite of food may worsen the pain. An appointment with your doctor or dentist will help you find the cause.     GET HELP RIGHT AWAY IF:  You have a high fever or chills If you  have had a recent head or face injury and develop headache, light headedness, nausea, vomiting, or other symptoms that concern you after an injury to your face or mouth, you could have a more serious injury in addition to your dental injury. A facial rash associated with a toothache: This condition may improve with medication. Contact your doctor for them to decide what is appropriate. Any jaw pain occurring with chest pain: Although jaw pain is most commonly caused by dental disease, it is sometimes referred pain from other areas. People with heart disease, especially people who have had stents placed, people with diabetes, or those who have had heart surgery may have jaw pain as a symptom of heart attack or angina. If your jaw or tooth pain is associated with lightheadedness, sweating, or shortness of breath, you should see a doctor as soon as possible. Trouble swallowing or excessive pain or bleeding from gums: If you have a history of a weakened immune system, diabetes, or steroid use, you may be more susceptible to infections. Infections can often be more severe and extensive or caused by unusual organisms. Dental and gum infections in people with these conditions may require more aggressive treatment. An abscess may need draining or IV antibiotics, for example.  MAKE SURE YOU   Understand these instructions. Will watch your condition. Will get help right away if   you are not doing well or get worse.  Thank you for choosing an e-visit.  Your e-visit answers were reviewed by a board certified advanced clinical practitioner to complete your personal care plan. Depending upon the condition, your plan could have included both over the counter or prescription medications.  Please review your pharmacy choice. Make sure the pharmacy is open so you can pick up prescription now. If there is a problem, you may contact your provider through MyChart messaging and have the prescription routed to another  pharmacy.  Your safety is important to us. If you have drug allergies check your prescription carefully.   For the next 24 hours you can use MyChart to ask questions about today's visit, request a non-urgent call back, or ask for a work or school excuse. You will get an email in the next two days asking about your experience. I hope that your e-visit has been valuable and will speed your recovery.  I have spent 5 minutes in review of e-visit questionnaire, review and updating patient chart, medical decision making and response to patient.   Archie Shea M Hiral Lukasiewicz, PA-C  

## 2022-12-23 ENCOUNTER — Telehealth: Payer: Medicaid Other | Admitting: Nurse Practitioner

## 2022-12-23 DIAGNOSIS — J4541 Moderate persistent asthma with (acute) exacerbation: Secondary | ICD-10-CM

## 2022-12-23 DIAGNOSIS — J40 Bronchitis, not specified as acute or chronic: Secondary | ICD-10-CM

## 2022-12-23 MED ORDER — PREDNISONE 20 MG PO TABS
20.0000 mg | ORAL_TABLET | Freq: Two times a day (BID) | ORAL | 0 refills | Status: AC
Start: 2022-12-23 — End: 2022-12-28

## 2022-12-23 MED ORDER — AZITHROMYCIN 250 MG PO TABS
ORAL_TABLET | ORAL | 0 refills | Status: AC
Start: 2022-12-23 — End: 2022-12-28

## 2022-12-23 MED ORDER — BENZONATATE 100 MG PO CAPS
100.0000 mg | ORAL_CAPSULE | Freq: Three times a day (TID) | ORAL | 0 refills | Status: DC | PRN
Start: 2022-12-23 — End: 2023-02-11

## 2022-12-23 NOTE — Progress Notes (Signed)
E-Visit for Cough  We are sorry that you are not feeling well.  Here is how we plan to help!  Based on your presentation I believe you most likely have A cough due to bacteria.  When patients have a fever and a productive cough with a change in color or increased sputum production, we are concerned about bacterial bronchitis.  If left untreated it can progress to pneumonia.  If your symptoms do not improve with your treatment plan it is important that you contact your provider.   I have prescribed Azithromyin 250 mg: two tablets now and then one tablet daily for 4 additonal days    In addition you may use A prescription cough medication called Tessalon Perles 100mg . You may take 1-2 capsules every 8 hours as needed for your cough.  Prednisone 20 mg twice daily for 5 days   Meds ordered this encounter  Medications   azithromycin (ZITHROMAX) 250 MG tablet    Sig: Take 2 tablets on day 1, then 1 tablet daily on days 2 through 5    Dispense:  6 tablet    Refill:  0   benzonatate (TESSALON) 100 MG capsule    Sig: Take 1 capsule (100 mg total) by mouth 3 (three) times daily as needed.    Dispense:  30 capsule    Refill:  0   predniSONE (DELTASONE) 20 MG tablet    Sig: Take 1 tablet (20 mg total) by mouth 2 (two) times daily with a meal for 5 days.    Dispense:  10 tablet    Refill:  0     From your responses in the eVisit questionnaire you describe inflammation in the upper respiratory tract which is causing a significant cough.  This is commonly called Bronchitis and has four common causes:   Allergies Viral Infections Acid Reflux Bacterial Infection Allergies, viruses and acid reflux are treated by controlling symptoms or eliminating the cause. An example might be a cough caused by taking certain blood pressure medications. You stop the cough by changing the medication. Another example might be a cough caused by acid reflux. Controlling the reflux helps control the cough.  USE OF  BRONCHODILATOR ("RESCUE") INHALERS: There is a risk from using your bronchodilator too frequently.  The risk is that over-reliance on a medication which only relaxes the muscles surrounding the breathing tubes can reduce the effectiveness of medications prescribed to reduce swelling and congestion of the tubes themselves.  Although you feel brief relief from the bronchodilator inhaler, your asthma may actually be worsening with the tubes becoming more swollen and filled with mucus.  This can delay other crucial treatments, such as oral steroid medications. If you need to use a bronchodilator inhaler daily, several times per day, you should discuss this with your provider.  There are probably better treatments that could be used to keep your asthma under control.     HOME CARE Only take medications as instructed by your medical team. Complete the entire course of an antibiotic. Drink plenty of fluids and get plenty of rest. Avoid close contacts especially the very young and the elderly Cover your mouth if you cough or cough into your sleeve. Always remember to wash your hands A steam or ultrasonic humidifier can help congestion.   GET HELP RIGHT AWAY IF: You develop worsening fever. You become short of breath You cough up blood. Your symptoms persist after you have completed your treatment plan MAKE SURE YOU  Understand these instructions.  Will watch your condition. Will get help right away if you are not doing well or get worse.    Thank you for choosing an e-visit.  Your e-visit answers were reviewed by a board certified advanced clinical practitioner to complete your personal care plan. Depending upon the condition, your plan could have included both over the counter or prescription medications.  Please review your pharmacy choice. Make sure the pharmacy is open so you can pick up prescription now. If there is a problem, you may contact your provider through [employer] and have  the prescription routed to another pharmacy.  Your safety is important to [place]. If you have drug allergies check your prescription carefully.   For the next 24 hours you can use MyChart to ask questions about today's visit, request a non-urgent call back, or ask for a work or school excuse. You will get an email in the next two days asking about your experience. I hope that your e-visit has been valuable and will speed your recovery.   I spent approximately 5 minutes reviewing the patient's history, current symptoms and coordinating their care today.

## 2023-01-10 NOTE — Progress Notes (Deleted)
FOLLOW UP Date of Service/Encounter:  01/10/23   Subjective:  Rachel Clay (DOB: 14-Sep-1990) is a 32 y.o. 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  For Review, LV was on 10/11/22  with Thermon Leyland, FNP seen for routine follow-up. This is my first encounter with this patient. See below for summary of history and diagnostics.  Therapeutic plans/changes recommended: Fev1 2.62 L, normal. Discussed using montelukast daily, cetirizine daily, PRN flonase, consider AIT, triamcinolone PRN, avoid dairy  Interim Hx: e visit 12/23/22 due to bronchitis prescribed prednisone. ----------------------------------------------------- Pertinent History/Diagnostics:  Asthma: Using Flovent during flares only.  Allergic Rhinitis:  Perennial nasal congestion, occasional clear rhinorrhea, and frequent sneezing.  - SPT environmental panel (09/21/22): positive to grass pollen, weed pollen, tree pollen, mold, dust mite, dog, cat, and cockroach  Food Allergy:  Facial swelling on milk exposure. - SPT select foods (***): *** Eczema: *** {Blank single:19197::"H/o Penicillin Allergy","***"}: *** Urticaria:  *** - labs (***) --------------------------------------------------- Today presents for follow-up. ***  Allergies as of 01/12/2023       Reactions   Milk-related Compounds Shortness Of Breath   dairy   Other Shortness Of Breath   dairy        Medication List        Accurate as of January 10, 2023 11:34 AM. If you have any questions, ask your nurse or doctor.          azelastine 0.1 % nasal spray Commonly known as: ASTELIN Place 1 spray into both nostrils 2 (two) times daily. Use in each nostril as directed   baclofen 10 MG tablet Commonly known as: LIORESAL Take 1  tablet (10 mg total) by mouth 3 (three) times daily.   benzonatate 100 MG capsule Commonly known as: TESSALON Take 1 capsule (100 mg total) by mouth 3 (three) times daily as needed.   cetirizine 10 MG tablet Commonly known as: ZYRTEC Take 1 tablet (10 mg total) by mouth daily.   clindamycin 1 % Swab Commonly known as: CLEOCIN T APPLY TO AFFECTED AREA TWICE A DAY AS NEEDED   cyclobenzaprine 10 MG tablet Commonly known as: FLEXERIL Take 1 tablet by mouth 3 (three) times daily as needed.   EPINEPHrine 0.3 mg/0.3 mL Soaj injection Commonly known as: EPI-PEN Inject 0.3 mg into the muscle as needed for anaphylaxis.   Eucrisa 2 % Oint Generic drug: Crisaborole APPLY TO AFFECTED AREA TWICE A DAY AS NEEDED   fluticasone 50 MCG/ACT nasal spray Commonly known as: FLONASE Place 2 sprays into both nostrils daily.   ibuprofen 800 MG tablet Commonly known as: ADVIL Take 1 tablet (800 mg total) by mouth every 8 (eight) hours as needed. DO NOT TAKE WITH NAPROXEN, ALEVE OR ADVIL   levalbuterol 1.25 MG/3ML nebulizer solution Commonly known as: XOPENEX Take 1.25 mg by nebulization every 4 (four) hours as needed for wheezing.   levalbuterol 45 MCG/ACT inhaler Commonly known as: Xopenex HFA Inhale 2 puffs into the lungs every 6 (six) hours as needed for wheezing.   levonorgestrel 20 MCG/24HR IUD Commonly known as: MIRENA by Intrauterine route.   metFORMIN 500 MG 24 hr tablet Commonly known as: GLUCOPHAGE-XR Take 500 mg by mouth 2 (two) times daily.   metoprolol succinate 25 MG 24 hr tablet Commonly known as: TOPROL-XL Take 12.5 mg by mouth daily.   montelukast  10 MG tablet Commonly known as: SINGULAIR Take 1 tablet (10 mg total) by mouth at bedtime.   MUCINEX D PO Take by mouth.   naproxen 500 MG tablet Commonly known as: Naprosyn Take 1 tablet (500 mg total) by mouth 2 (two) times daily with a meal.   Qvar RediHaler 40 MCG/ACT inhaler Generic drug: beclomethasone Inhale  2 puffs into the lungs 2 (two) times daily. Use for 2 weeks or until cough free then stop.   triamcinolone cream 0.1 % Commonly known as: KENALOG APPLY TO AFFECTED AREA TWICE DAILY AS NEEDED   Vitamin D (Ergocalciferol) 1.25 MG (50000 UNIT) Caps capsule Commonly known as: DRISDOL Take by mouth.       Past Medical History:  Diagnosis Date   Asthma    childhood   Eczema    Past Surgical History:  Procedure Laterality Date   NASAL SINUS SURGERY     Otherwise, there have been no changes to her past medical history, surgical history, family history, or social history.  ROS: All others negative except as noted per HPI.   Objective:  There were no vitals taken for this visit. There is no height or weight on file to calculate BMI. Physical Exam: General Appearance:  Alert, cooperative, no distress, appears stated age  Head:  Normocephalic, without obvious abnormality, atraumatic  Eyes:  Conjunctiva clear, EOM's intact  Nose: Nares normal, {Blank multiple:19196:a:"***","hypertrophic turbinates","normal mucosa","no visible anterior polyps","septum midline"}  Throat: Lips, tongue normal; teeth and gums normal, {Blank multiple:19196:a:"***","normal posterior oropharynx","tonsils 2+","tonsils 3+","no tonsillar exudate","+ cobblestoning","surgically absent tonsils"}  Neck: Supple, symmetrical  Lungs:   {Blank multiple:19196:a:"***","clear to auscultation bilaterally","end-expiratory wheezing","wheezing throughout"}, Respirations unlabored, {Blank multiple:19196:a:"***","no coughing","intermittent dry coughing"}  Heart:  {Blank multiple:19196:a:"***","regular rate and rhythm","no murmur"}, Appears well perfused  Extremities: No edema  Skin: {Blank multiple:19196:a:"***","Skin color, texture, turgor normal","no rashes or lesions on visualized portions of skin"}  Neurologic: No gross deficits   Labs: ***  Spirometry:  Tracings reviewed. Her effort: {Blank single:19197::"Good reproducible  efforts.","It was hard to get consistent efforts and there is a question as to whether this reflects a maximal maneuver.","Poor effort, data can not be interpreted.","Variable effort-results affected","effort okay for first attempt at spirometry.","Results not reproducible due to ***"} FVC: ***L FEV1: ***L, ***% predicted FEV1/FVC ratio: ***% Interpretation: {Blank single:19197::"Spirometry consistent with mild obstructive disease","Spirometry consistent with moderate obstructive disease","Spirometry consistent with severe obstructive disease","Spirometry consistent with possible restrictive disease","Spirometry consistent with mixed obstructive and restrictive disease","Spirometry uninterpretable due to technique","Spirometry consistent with normal pattern","No overt abnormalities noted given today's efforts","Nonobstructive ratio, low FEV1","Nonobstructive ratio, low FEV1, possible restriction"}.  Please see scanned spirometry results for details.  Skin Testing: {Blank single:19197::"Select foods","Environmental allergy panel","Environmental allergy panel and select foods","Food allergy panel","None","Deferred due to recent antihistamines use","deferred due to recent reaction","Pediatric Environmental Allergy Panel","Pediatric Food Panel","Select foods and environmental allergies"}. {Blank single:19197::"Adequate positive and negative controls","Inadequate positive control-testing invalid","Adequate positive and negative controls, dermatographism present, testing difficult to interpret"}. Results discussed with patient/family.   {Blank single:19197::"Allergy testing results were read and interpreted by myself, documented by clinical staff.","Allergy testing results were read by ***,FNP, documented by clinical staff"}  Assessment/Plan   ***  Tonny Bollman, MD  [facility]

## 2023-01-12 ENCOUNTER — Ambulatory Visit: Payer: Medicaid Other | Admitting: Internal Medicine

## 2023-02-07 ENCOUNTER — Telehealth: Payer: Medicaid Other | Admitting: Family Medicine

## 2023-02-07 DIAGNOSIS — B9689 Other specified bacterial agents as the cause of diseases classified elsewhere: Secondary | ICD-10-CM

## 2023-02-07 DIAGNOSIS — N760 Acute vaginitis: Secondary | ICD-10-CM

## 2023-02-08 MED ORDER — METRONIDAZOLE 500 MG PO TABS
500.0000 mg | ORAL_TABLET | Freq: Two times a day (BID) | ORAL | 0 refills | Status: AC
Start: 2023-02-08 — End: 2023-02-15

## 2023-02-08 NOTE — Progress Notes (Signed)

## 2023-02-11 ENCOUNTER — Telehealth: Payer: Medicaid Other | Admitting: Physician Assistant

## 2023-02-11 DIAGNOSIS — S39012A Strain of muscle, fascia and tendon of lower back, initial encounter: Secondary | ICD-10-CM

## 2023-02-11 MED ORDER — NAPROXEN 500 MG PO TABS
500.0000 mg | ORAL_TABLET | Freq: Two times a day (BID) | ORAL | 0 refills | Status: AC
Start: 2023-02-11 — End: ?

## 2023-02-11 MED ORDER — CYCLOBENZAPRINE HCL 10 MG PO TABS
5.0000 mg | ORAL_TABLET | Freq: Three times a day (TID) | ORAL | 0 refills | Status: AC | PRN
Start: 2023-02-11 — End: ?

## 2023-02-11 NOTE — Progress Notes (Signed)

## 2023-03-01 ENCOUNTER — Emergency Department (HOSPITAL_BASED_OUTPATIENT_CLINIC_OR_DEPARTMENT_OTHER)
Admission: EM | Admit: 2023-03-01 | Discharge: 2023-03-01 | Disposition: A | Discharge status: Home / Self Care | Payer: Medicaid Other | Attending: Emergency Medicine | Admitting: Emergency Medicine

## 2023-03-01 ENCOUNTER — Other Ambulatory Visit: Payer: Self-pay

## 2023-03-01 ENCOUNTER — Encounter (HOSPITAL_BASED_OUTPATIENT_CLINIC_OR_DEPARTMENT_OTHER): Payer: Self-pay | Admitting: Pediatrics

## 2023-03-01 ENCOUNTER — Emergency Department (HOSPITAL_BASED_OUTPATIENT_CLINIC_OR_DEPARTMENT_OTHER): Payer: Medicaid Other

## 2023-03-01 DIAGNOSIS — B3731 Acute candidiasis of vulva and vagina: Secondary | ICD-10-CM | POA: Insufficient documentation

## 2023-03-01 DIAGNOSIS — Z3491 Encounter for supervision of normal pregnancy, unspecified, first trimester: Secondary | ICD-10-CM | POA: Insufficient documentation

## 2023-03-01 DIAGNOSIS — Z3490 Encounter for supervision of normal pregnancy, unspecified, unspecified trimester: Secondary | ICD-10-CM

## 2023-03-01 DIAGNOSIS — J45909 Unspecified asthma, uncomplicated: Secondary | ICD-10-CM | POA: Insufficient documentation

## 2023-03-01 LAB — WET PREP, GENITAL
Clue Cells Wet Prep HPF POC: NONE SEEN
Sperm: NONE SEEN
Trich, Wet Prep: NONE SEEN
WBC, Wet Prep HPF POC: <10 (ref <10)

## 2023-03-01 LAB — HIV ANTIBODY (ROUTINE TESTING W REFLEX): HIV Screen 4th Generation wRfx: NONREACTIVE

## 2023-03-01 LAB — HCG, QUANTITATIVE, PREGNANCY: hCG, Beta Chain, Quant, S: 9653 m[IU]/mL — ABNORMAL HIGH (ref <5)

## 2023-03-01 LAB — PREGNANCY, URINE: Preg Test, Ur: POSITIVE — AB

## 2023-03-01 MED ORDER — PRENATAL VITAMINS 28-0.8 MG PO TABS: 1.0000 | ORAL_TABLET | Freq: Every day | ORAL | 0 refills | Status: AC

## 2023-03-01 MED ORDER — FAMOTIDINE 20 MG PO TABS: 20.0000 mg | ORAL_TABLET | Freq: Every day | ORAL | 0 refills | Status: AC

## 2023-03-01 MED ORDER — MONISTAT 1 COMBO PACK 1200 & 2 MG & % VA KIT: 1.0000 | PACK | Freq: Once | VAGINAL | 0 refills | Status: AC

## 2023-03-01 NOTE — ED Notes (Signed)
Pt verbalized understanding of discharge paperwork, prescription and follow-up care.  

## 2023-03-01 NOTE — ED Provider Notes (Signed)
[facility] Provider Note   CSN: [id number] Arrival date & time: 03/01/23  7829     History  Chief Complaint  Patient presents with   Heartburn   Possible Pregnancy    Rachel Clay is a 32 y.o. female.  Pt is a 32 yo female with pmhx significant for asthma.  Pt thinks she may be pregnant as she has not had a period since June.  She has taken a home pregnancy test, but the line was faint.  Pt does wish to be checked for STDs.  She has had occasional heartburn, but none now.  She mainly wants a proof of pregnancy for her insurance.  Pt denies any pain or vaginal bleeding.       Home Medications Prior to Admission medications   Medication Sig Start Date End Date Taking? Authorizing Provider  famotidine (PEPCID) 20 MG tablet Take 1 tablet (20 mg total) by mouth daily. 03/01/23  Yes Jacalyn Lefevre, MD  miconazole (MONISTAT 1 COMBO PACK) kit Place 1 each vaginally once for 1 dose. 03/01/23 03/01/23 Yes Jacalyn Lefevre, MD  Prenatal Vit-Fe Fumarate-FA (PRENATAL VITAMINS) 28-0.8 MG TABS Take 1 capsule by mouth daily at 6 (six) AM. 03/01/23  Yes Jacalyn Lefevre, MD  azelastine (ASTELIN) 0.1 % nasal spray Place 1 spray into both nostrils 2 (two) times daily. Use in each nostril as directed 09/06/22   Margaretann Loveless, PA-C  beclomethasone (QVAR REDIHALER) 40 MCG/ACT inhaler Inhale 2 puffs into the lungs 2 (two) times daily. Use for 2 weeks or until cough free then stop. 10/11/22   Hetty Blend, FNP  cetirizine (ZYRTEC) 10 MG tablet Take 1 tablet (10 mg total) by mouth daily. 10/11/22 08/06/23  Hetty Blend, FNP  cyclobenzaprine (FLEXERIL) 10 MG tablet Take 0.5-1 tablets (5-10 mg total) by mouth 3 (three) times daily as needed. 02/11/23   Margaretann Loveless, PA-C  EPINEPHrine 0.3 mg/0.3 mL IJ SOAJ injection Inject 0.3 mg into the muscle as needed for anaphylaxis. 10/11/22   Ambs, Norvel Richards, FNP  EUCRISA 2 % OINT APPLY TO AFFECTED AREA TWICE A DAY AS  NEEDED 10/05/20   [provider]  fluticasone (FLONASE) 50 MCG/ACT nasal spray Place 2 sprays into both nostrils daily. 04/20/22   Bennie Pierini, FNP  levalbuterol [facility] HFA) 45 MCG/ACT inhaler Inhale 2 puffs into the lungs every 6 (six) hours as needed for wheezing. 10/12/20   Marcelyn Bruins, MD  levalbuterol Pauline Aus) 1.25 MG/3ML nebulizer solution Take 1.25 mg by nebulization every 4 (four) hours as needed for wheezing. 11/03/20   Marcelyn Bruins, MD  levonorgestrel (MIRENA) 20 MCG/24HR IUD by Intrauterine route.    [provider]  metFORMIN (GLUCOPHAGE-XR) 500 MG 24 hr tablet Take 500 mg by mouth 2 (two) times daily. 04/04/21   [provider]  metoprolol succinate (TOPROL-XL) 25 MG 24 hr tablet Take 12.5 mg by mouth daily. 05/09/21   [provider]  montelukast (SINGULAIR) 10 MG tablet Take 1 tablet (10 mg total) by mouth at bedtime. 10/11/22   Hetty Blend, FNP  naproxen (NAPROSYN) 500 MG tablet Take 1 tablet (500 mg total) by mouth 2 (two) times daily with a meal. 02/11/23   Burnette, Alessandra Bevels, PA-C  Pseudoephedrine-Guaifenesin [facility] D PO) Take by mouth.    [provider]  triamcinolone (KENALOG) 0.1 % APPLY TO AFFECTED AREA TWICE DAILY AS NEEDED 08/16/19   [provider]  Vitamin D, Ergocalciferol, (DRISDOL)  1.25 MG (50000 UNIT) CAPS capsule Take by mouth. 11/18/21   [provider]      Allergies    Milk-related compounds and Other    Review of Systems   Review of Systems  Gastrointestinal:        Heart burn  All other systems reviewed and are negative.   Physical Exam Updated Vital Signs BP 124/83 (BP Location: Right Arm)   Pulse 66   Temp 98.2 F (36.8 C) (Oral)   Resp 16   Ht 5\' 7"  (1.702 m)   Wt 104.3 kg   LMP 12/29/2022 (Approximate)   SpO2 100%   BMI 36.02 kg/m  Physical Exam Vitals and nursing note reviewed. Exam conducted with a chaperone present.  Constitutional:       Appearance: Normal appearance.  HENT:     Head: Normocephalic and atraumatic.     Right Ear: External ear normal.     Left Ear: External ear normal.     Nose: Nose normal.     Mouth/Throat:     Mouth: Mucous membranes are moist.     Pharynx: Oropharynx is clear.  Eyes:     Extraocular Movements: Extraocular movements intact.     Conjunctiva/sclera: Conjunctivae normal.     Pupils: Pupils are equal, round, and reactive to light.  Cardiovascular:     Rate and Rhythm: Normal rate and regular rhythm.     Pulses: Normal pulses.     Heart sounds: Normal heart sounds.  Pulmonary:     Effort: Pulmonary effort is normal.     Breath sounds: Normal breath sounds.  Abdominal:     General: Abdomen is flat. Bowel sounds are normal.     Palpations: Abdomen is soft.  Genitourinary:    Exam position: Lithotomy position.     Vagina: Normal.     Cervix: Discharge present.     Uterus: Normal.      Adnexa: Right adnexa normal and left adnexa normal.  Musculoskeletal:        General: Normal range of motion.     Cervical back: Normal range of motion and neck supple.  Skin:    General: Skin is warm.     Capillary Refill: Capillary refill takes less than 2 seconds.  Neurological:     General: No focal deficit present.     Mental Status: She is alert and oriented to person, place, and time.  Psychiatric:        Mood and Affect: Mood normal.        Behavior: Behavior normal.     ED Results / Procedures / Treatments   Labs (all labs ordered are listed, but only abnormal results are displayed) Labs Reviewed  WET PREP, GENITAL - Abnormal; Notable for the following components:      Result Value   Yeast Wet Prep HPF POC PRESENT (*)    All other components within normal limits  PREGNANCY, URINE - Abnormal; Notable for the following components:   Preg Test, Ur POSITIVE (*)    All other components within normal limits  HCG, QUANTITATIVE, PREGNANCY - Abnormal; Notable for the following  components:   hCG, Beta Chain, Quant, S 2,595 (*)    All other components within normal limits  HIV ANTIBODY (ROUTINE TESTING W REFLEX)  GC/CHLAMYDIA PROBE AMP ([place]) NOT AT [facility]    EKG None  Radiology US OB LESS THAN 14 WEEKS WITH OB TRANSVAGINAL  Result Date: 03/01/2023 CLINICAL DATA:  [id number] Heartburn during pregnancy  [id number] EXAM: OBSTETRIC <14 WK [place] AND TRANSVAGINAL OB US TECHNIQUE: Both transabdominal and transvaginal ultrasound examinations were performed for complete evaluation of the gestation as well as the maternal uterus, adnexal regions, and pelvic cul-de-sac. Transvaginal technique was performed to assess early pregnancy. COMPARISON:  None Available. FINDINGS: Intrauterine gestational sac: Single Yolk sac:  Visualized. Embryo:  Visualized. Cardiac Activity: Visualized. Heart Rate: 105 bpm CRL:  5.9 mm   6 w   3 d                  [place] EDC: 10/22/2023 Subchorionic hemorrhage:  None visualized. Maternal uterus/adnexae: Left ovarian corpus luteal cyst. Right ovary within normal limits. No free fluid within the pelvis. IMPRESSION: 1. Single live intrauterine gestation measuring 6 weeks 3 days by crown-rump length. 2. Active fetal heart tones at 105 BPM, at the lower limits of normal. Close clinical follow-up and short-term follow-up ultrasound in 7-10 days is recommended to ensure viability. Electronically Signed   By: Duanne Guess D.O.   On: 03/01/2023 13:41    Procedures Procedures    Medications Ordered in ED Medications - No data to display  ED Course/ Medical Decision Making/ A&P                                 Medical Decision Making Amount and/or Complexity of Data Reviewed Labs: ordered. Radiology: ordered.  Risk OTC drugs.   This patient presents to the ED for concern of pregnancy, this involves an extensive number of treatment options, and is a complaint that carries with it a high risk of complications and morbidity.  The differential diagnosis  includes pregnancy   Co morbidities that complicate the patient evaluation  asthma   Additional history obtained:  Additional history obtained from epic chart review  Lab Tests:  I Ordered, and personally interpreted labs.  The pertinent results include:  preg +, quant 9653, wet prep with + yeast   Imaging Studies ordered:  I ordered imaging studies including [place]  I independently visualized and interpreted imaging which showed  . Single live intrauterine gestation measuring 6 weeks 3 days by  crown-rump length.  2. Active fetal heart tones at 105 BPM, at the lower limits of  normal. Close clinical follow-up and short-term follow-up ultrasound  in 7-10 days is recommended to ensure viability.   I agree with the radiologist interpretation   Cardiac Monitoring:  The patient was maintained on a cardiac monitor.  I personally viewed and interpreted the cardiac monitored which showed an underlying rhythm of: nsr   Medicines ordered and prescription drug management:   I have reviewed the patients home medicines and have made adjustments as needed   Test Considered:  [place]   Problem List / ED Course:  Early pregnancy:  pt told that she needs a repeat US.  She needs to return if worse.  F/u with obgyn   Reevaluation:  After the interventions noted above, I reevaluated the patient and found that they have :improved   Social Determinants of Health:  Lives at home   Dispostion:  After consideration of the diagnostic results and the patients response to treatment, I feel that the patent would benefit from discharge with outpatient f/u.          Final Clinical Impression(s) / ED Diagnoses Final diagnoses:  Vaginal candidiasis  Early stage of pregnancy    Rx / DC Orders ED Discharge Orders  Ordered    famotidine (PEPCID) 20 MG tablet  Daily        03/01/23 1408    Prenatal Vit-Fe Fumarate-FA (PRENATAL VITAMINS) 28-0.8 MG TABS  Daily         03/01/23 1408    miconazole (MONISTAT 1 COMBO PACK) kit   Once        03/01/23 1409              Jacalyn Lefevre, MD 03/01/23 1415

## 2023-03-01 NOTE — ED Triage Notes (Signed)
C/O heartburn with no relief from OTC rolaids and concern for possible pregnancy. Reports LMP 06/13 and is sexually active. Denies pain and or spotting.

## 2023-03-01 NOTE — Discharge Instructions (Addendum)
You are 6 weeks and 2 days with a due date of 10/22/2023

## 2023-03-02 LAB — GC/CHLAMYDIA PROBE AMP (~~LOC~~) NOT AT ARMC
Chlamydia: NEGATIVE
Comment: NEGATIVE
Comment: NORMAL
Neisseria Gonorrhea: NEGATIVE

## 2023-03-11 ENCOUNTER — Telehealth: Payer: Medicaid Other | Admitting: Nurse Practitioner

## 2023-03-11 DIAGNOSIS — B3731 Acute candidiasis of vulva and vagina: Secondary | ICD-10-CM | POA: Diagnosis not present

## 2023-03-11 NOTE — Progress Notes (Signed)
E-Visit for Vaginal Symptoms  We are sorry that you are not feeling well. Here is how we plan to help! Based on what you shared with me it looks like you: May have a yeast vaginosis  Vaginosis is an inflammation of the vagina that can result in discharge, itching and pain. The cause is usually a change in the normal balance of vaginal bacteria or an infection. Vaginosis can also result from reduced estrogen levels after menopause.  The most common causes of vaginosis are:   Bacterial vaginosis which results from an overgrowth of one on several organisms that are normally present in your vagina.   Yeast infections which are caused by a naturally occurring fungus called candida.   Vaginal atrophy (atrophic vaginosis) which results from the thinning of the vagina from reduced estrogen levels after menopause.   Trichomoniasis which is caused by a parasite and is commonly transmitted by sexual intercourse.  Factors that increase your risk of developing vaginosis include: Medications, such as antibiotics and steroids Uncontrolled diabetes Use of hygiene products such as bubble bath, vaginal spray or vaginal deodorant Douching Wearing damp or tight-fitting clothing Using an intrauterine device (IUD) for birth control Hormonal changes, such as those associated with pregnancy, birth control pills or menopause Sexual activity Having a sexually transmitted infection  Your treatment plan is Monistat (miconazole) or Gyne-Lotrimin (clotrimazole) over the counter at most pharmacies. Because you are pregnant you will need to use over the counter medication as stated above. Diflucan is not safe  to take while pregnant. Vagisial is not usually a treatment for yeast issues.  Be sure to take all of the medication as directed. Stop taking any medication if you develop a rash, tongue swelling or shortness of breath. Mothers who are breast feeding should consider pumping and discarding their breast milk  while on these antibiotics. However, there is no consensus that infant exposure at these doses would be harmful.  Remember that medication creams can weaken latex condoms. Marland Kitchen   HOME CARE:  Good hygiene may prevent some types of vaginosis from recurring and may relieve some symptoms:  Avoid baths, hot tubs and whirlpool spas. Rinse soap from your outer genital area after a shower, and dry the area well to prevent irritation. Don't use scented or harsh soaps, such as those with deodorant or antibacterial action. Avoid irritants. These include scented tampons and pads. Wipe from front to back after using the toilet. Doing so avoids spreading fecal bacteria to your vagina.  Other things that may help prevent vaginosis include:  Don't douche. Your vagina doesn't require cleansing other than normal bathing. Repetitive douching disrupts the normal organisms that reside in the vagina and can actually increase your risk of vaginal infection. Douching won't clear up a vaginal infection. Use a latex condom. Both female and female latex condoms may help you avoid infections spread by sexual contact. Wear cotton underwear. Also wear pantyhose with a cotton crotch. If you feel comfortable without it, skip wearing underwear to bed. Yeast thrives in [employer] Your symptoms should improve in the next day or two.  GET HELP RIGHT AWAY IF:  You have pain in your lower abdomen ( pelvic area or over your ovaries) You develop nausea or vomiting You develop a fever Your discharge changes or worsens You have persistent pain with intercourse You develop shortness of breath, a rapid pulse, or you faint.  These symptoms could be signs of problems or infections that need to be evaluated by a medical  provider now.  MAKE SURE YOU   Understand these instructions. Will watch your condition. Will get help right away if you are not doing well or get worse.  Thank you for choosing an e-visit.  Your e-visit  answers were reviewed by a board certified advanced clinical practitioner to complete your personal care plan. Depending upon the condition, your plan could have included both over the counter or prescription medications.  Please review your pharmacy choice. Make sure the pharmacy is open so you can pick up prescription now. If there is a problem, you may contact your provider through [employer] and have the prescription routed to another pharmacy.  Your safety is important to [place]. If you have drug allergies check your prescription carefully.   For the next 24 hours you can use MyChart to ask questions about today's visit, request a non-urgent call back, or ask for a work or school excuse. You will get an email in the next two days asking about your experience. I hope that your e-visit has been valuable and will speed your recovery.  Mary-Margaret Daphine Deutscher, FNP   5-10 minutes spent reviewing and documenting in chart.

## 2023-03-11 NOTE — Addendum Note (Signed)
Addended by: Bennie Pierini on: 03/11/2023 04:19 PM   Modules accepted: Level of Service

## 2023-04-12 ENCOUNTER — Telehealth: Payer: Medicaid Other | Admitting: Physician Assistant

## 2023-04-12 DIAGNOSIS — O99891 Other specified diseases and conditions complicating pregnancy: Secondary | ICD-10-CM

## 2023-04-12 NOTE — Progress Notes (Signed)
For the safety of you and your child, I recommend a face to face office visit with a health care provider.  Many mothers need to take medicines during their pregnancy and while nursing.  After reviewing your E-Visit request, I recommend that you consult your OB/GYN or PCP for medical advice in relation to your condition and prescription medications while pregnant or breastfeeding.  NOTE:  There will be NO CHARGE for this eVisit  If you are having a true medical emergency please call 911.    For an urgent face to face visit, [facility] has six urgent care centers for your convenience:     [facility] Health Urgent Care Center at [facility] Directions [phone] [address] [place], [place] [address]    [facility] Health Urgent Care Center [facility]) Get Driving Directions [phone] [address] [place], [place] [address]  [facility] Health Urgent Care Center [facility]) Get Driving Directions [phone] [address] [place],  [place]  [address]  [facility] Health Urgent Care at [facility] Get Driving Directions [phone] [address] [place], [place] [address]   [facility] Health Urgent Care at [facility] Get Driving Directions  [phone] [address] [place], [place] [address]   [facility] Health Urgent Care at [facility] Directions [phone] [address] [place], [place] [address]  Your MyChart E-visit questionnaire answers were reviewed by a board certified advanced clinical practitioner to complete your personal care plan based on your specific symptoms.  Thank you for using e-Visits.

## 2023-05-18 ENCOUNTER — Telehealth: Payer: Medicaid Other | Admitting: Emergency Medicine

## 2023-05-18 DIAGNOSIS — J309 Allergic rhinitis, unspecified: Secondary | ICD-10-CM | POA: Diagnosis not present

## 2023-05-18 MED ORDER — FLUTICASONE PROPIONATE 50 MCG/ACT NA SUSP: 2.0000 | Freq: Every day | NASAL | 0 refills | Status: AC

## 2023-05-18 MED ORDER — LEVOCETIRIZINE DIHYDROCHLORIDE 5 MG PO TABS: 5.0000 mg | ORAL_TABLET | Freq: Every evening | ORAL | 0 refills | Status: AC

## 2023-05-18 NOTE — Progress Notes (Signed)
E visit for Allergic Rhinitis We are sorry that you are not feeling well.  Here is how we plan to help!  Based on what you have shared with me it looks like you have Allergic Rhinitis.  Rhinitis is when a reaction occurs that causes nasal congestion, runny nose, sneezing, and itching.  Most types of rhinitis are caused by an inflammation and are associated with symptoms in the eyes ears or throat. There are several types of rhinitis.  The most common are acute rhinitis, which is usually caused by a viral illness, allergic or seasonal rhinitis, and nonallergic or year-round rhinitis.  Nasal allergies occur certain times of the year.  Allergic rhinitis is caused when allergens in the air trigger the release of histamine in the body.  Histamine causes itching, swelling, and fluid to build up in the fragile linings of the nasal passages, sinuses and eyelids.  An itchy nose and clear discharge are common.  I recommend discontinuing the Zyrtec, (your body can get used to it) and start the following prescription treatments: Xyzal 5 mg take 1 tablet daily  I also would recommend a nasal spray: Flonase 2 sprays into each nostril once daily    HOME CARE:  You can use an over-the-counter saline nasal spray as needed Avoid areas where there is heavy dust, mites, or molds Stay indoors on windy days during the pollen season Keep windows closed in home, at least in bedroom; use air conditioner. Use high-efficiency house air filter Keep windows closed in car, turn AC on re-circulate Avoid playing out with dog during pollen season  GET HELP RIGHT AWAY IF:  If your symptoms do not improve within 10 days You become short of breath You develop yellow or green discharge from your nose for over 3 days You have coughing fits  MAKE SURE YOU:  Understand these instructions Will watch your condition Will get help right away if you are not doing well or get worse  Thank you for choosing an e-visit. Your  e-visit answers were reviewed by a board certified advanced clinical practitioner to complete your personal care plan. Depending upon the condition, your plan could have included both over the counter or prescription medications. Please review your pharmacy choice. Be sure that the pharmacy you have chosen is open so that you can pick up your prescription now.  If there is a problem you may message your provider in MyChart to have the prescription routed to another pharmacy. Your safety is important to [place]. If you have drug allergies check your prescription carefully.  For the next 24 hours, you can use MyChart to ask questions about today's visit, request a non-urgent call back, or ask for a work or school excuse from your e-visit provider. You will get an email in the next two days asking about your experience. I hope that your e-visit has been valuable and will speed your recovery.       Approximately 5 minutes was used in reviewing the patient's chart, questionnaire, prescribing medications, and documentation.

## 2023-05-26 ENCOUNTER — Telehealth: Payer: Medicaid Other | Admitting: Nurse Practitioner

## 2023-05-26 DIAGNOSIS — J4521 Mild intermittent asthma with (acute) exacerbation: Secondary | ICD-10-CM

## 2023-05-26 DIAGNOSIS — Z3490 Encounter for supervision of normal pregnancy, unspecified, unspecified trimester: Secondary | ICD-10-CM

## 2023-05-26 NOTE — Progress Notes (Signed)
Rachel Clay,  Thank you for submitting an e-visit. It sounds like your asthma is getting exacerbated possibly by the weather or a virus. Have you spoken to your OBGYN about managing your asthma while pregnant and safe over the counter medications?  If not we encourage you to make an appointment to discuss those subjects. At this time it does not sound like you need an antibiotic, but more management of your asthma.   For the safety of you and your child, I recommend a face to face office visit with a health care provider.  Many mothers need to take medicines during their pregnancy and while nursing.  Almost all medicines pass into the breast milk in small quantities.  Most are generally considered safe for a mother to take but some medicines must be avoided.  After reviewing your E-Visit request, I recommend that you consult your OB/GYN or pediatrician for medical advice in relation to your condition and prescription medications while pregnant or breastfeeding.  NOTE:  There will be NO CHARGE for this eVisit  If you are having a true medical emergency please call 911.    For an urgent face to face visit, [facility] has six urgent care centers for your convenience:     [facility] Health Urgent Care Center at [facility] Directions [phone] [address] [place], [place] [address]    [facility] Health Urgent Care Center [facility]) Get Driving Directions [phone] [address] [place], [place] [address]  [facility] Health Urgent Care Center [facility]) Get Driving Directions [phone] [address] [place],  [place]  [address]  [facility] Health Urgent Care at [facility] Get Driving Directions [phone] [address] [place], [place] [address]   [facility] Health Urgent Care at [facility] Get Driving Directions  [phone] [address] [place], [place] [address]   [facility] Health Urgent Care at  [facility] Directions [phone] [address] [place], [place] [address]  Your MyChart E-visit questionnaire answers were reviewed by a board certified advanced clinical practitioner to complete your personal care plan based on your specific symptoms.  Thank you for using e-Visits.     Thank you Rachel Clay

## 2023-07-26 ENCOUNTER — Telehealth: Payer: Medicaid Other | Admitting: Family Medicine

## 2023-07-26 DIAGNOSIS — O219 Vomiting of pregnancy, unspecified: Secondary | ICD-10-CM

## 2023-07-26 NOTE — Progress Notes (Signed)
 For the safety of you and your child, I recommend a face to face office visit with a health care provider.  Many mothers need to take medicines during their pregnancy and while nursing.  Almost all medicines pass into the breast milk in small quantities.  Most are generally considered safe for a mother to take but some medicines must be avoided.  After reviewing your E-Visit request, I recommend that you consult your OB/GYN or pediatrician for medical advice in relation to your condition and prescription medications while pregnant or breastfeeding.  NOTE:  There will be NO CHARGE for this eVisit  If you are having a true medical emergency please call 911.

## 2023-12-11 ENCOUNTER — Telehealth: Admitting: Physician Assistant

## 2023-12-11 DIAGNOSIS — B3731 Acute candidiasis of vulva and vagina: Secondary | ICD-10-CM | POA: Diagnosis not present

## 2023-12-11 MED ORDER — FLUCONAZOLE 150 MG PO TABS: 150.0000 mg | ORAL_TABLET | ORAL | 0 refills | Status: AC | PRN

## 2023-12-11 NOTE — Progress Notes (Signed)

## 2023-12-18 ENCOUNTER — Telehealth: Admitting: Nurse Practitioner

## 2023-12-18 DIAGNOSIS — N898 Other specified noninflammatory disorders of vagina: Secondary | ICD-10-CM

## 2023-12-18 NOTE — Progress Notes (Signed)
  Because you have had a new sexual partner we recommend an in person visit where you can have testing completed that will include yeast/BV and STI if needed, I feel your condition warrants further evaluation and I recommend that you be seen in a face-to-face visit.   NOTE: There will be NO CHARGE for this E-Visit   If you are having a true medical emergency, please call 911.     For an urgent face to face visit, [facility] has multiple urgent care centers for your convenience.  Click the link below for the full list of locations and hours, walk-in wait times, appointment scheduling options and driving directions:  Urgent Care - [place], [place], [place], [place], [place], [place]  [place]     Your MyChart E-visit questionnaire answers were reviewed by a board certified advanced clinical practitioner to complete your personal care plan based on your specific symptoms.    Thank you for using e-Visits.

## 2024-01-18 ENCOUNTER — Telehealth: Admitting: Physician Assistant

## 2024-01-18 ENCOUNTER — Telehealth

## 2024-01-18 DIAGNOSIS — Z760 Encounter for issue of repeat prescription: Secondary | ICD-10-CM

## 2024-01-18 NOTE — Progress Notes (Signed)
   Thank you for the details you included in the comment boxes. Those details are very helpful in determining the best course of treatment for you and help us  to provide the best care. Because you are requesting a refill on a chronic medication, we recommend that you schedule a Virtual Urgent Care video visit in order for the provider to better assess what is going on.  The provider will be able to give you a more accurate diagnosis and treatment plan if we can more freely discuss your symptoms and with the addition of a virtual examination.   If you change your visit to a video visit, we will bill your insurance (similar to an office visit) and you will not be charged for this e-Visit. You will be able to stay at home and speak with the first available [facility] advanced practice provider. The link to do a video visit is in the drop down Menu tab of your Welcome screen in MyChart.       I have spent 5 minutes in review of e-visit questionnaire, review and updating patient chart, medical decision making and response to patient.   Delon CHRISTELLA Dickinson, PA-C

## 2024-02-19 ENCOUNTER — Telehealth

## 2024-05-06 ENCOUNTER — Telehealth: Admitting: Physician Assistant

## 2024-05-06 DIAGNOSIS — L301 Dyshidrosis [pompholyx]: Secondary | ICD-10-CM

## 2024-05-06 MED ORDER — BETAMETHASONE VALERATE 0.1 % EX CREA: TOPICAL_CREAM | Freq: Two times a day (BID) | CUTANEOUS | 0 refills | Status: AC

## 2024-05-06 MED ORDER — MUPIROCIN 2 % EX OINT
1.0000 | TOPICAL_OINTMENT | Freq: Two times a day (BID) | CUTANEOUS | 0 refills | Status: AC
Start: 2024-05-06 — End: ?

## 2024-05-06 NOTE — Progress Notes (Signed)
